# Patient Record
Sex: Female | Born: 1980 | Race: Black or African American | Hispanic: No | Smoking: Never smoker
Health system: Southern US, Community
[De-identification: ages and names within clinical notes are randomized; demographics above are authoritative.]

## PROBLEM LIST (undated history)

## (undated) ENCOUNTER — Inpatient Hospital Stay (HOSPITAL_COMMUNITY): Payer: Self-pay

## (undated) DIAGNOSIS — R87629 Unspecified abnormal cytological findings in specimens from vagina: Secondary | ICD-10-CM

## (undated) DIAGNOSIS — M7710 Lateral epicondylitis, unspecified elbow: Secondary | ICD-10-CM

## (undated) DIAGNOSIS — D649 Anemia, unspecified: Secondary | ICD-10-CM

## (undated) DIAGNOSIS — R51 Headache: Secondary | ICD-10-CM

## (undated) DIAGNOSIS — S0300XA Dislocation of jaw, unspecified side, initial encounter: Secondary | ICD-10-CM

## (undated) DIAGNOSIS — R519 Headache, unspecified: Secondary | ICD-10-CM

## (undated) DIAGNOSIS — D5720 Sickle-cell/Hb-C disease without crisis: Secondary | ICD-10-CM

## (undated) HISTORY — PX: NO PAST SURGERIES: SHX2092

## (undated) HISTORY — DX: Dislocation of jaw, unspecified side, initial encounter: S03.00XA

## (undated) HISTORY — DX: Unspecified abnormal cytological findings in specimens from vagina: R87.629

## (undated) HISTORY — DX: Sickle-cell/Hb-C disease without crisis: D57.20

## (undated) HISTORY — DX: Lateral epicondylitis, unspecified elbow: M77.10

---

## 2006-08-23 ENCOUNTER — Inpatient Hospital Stay (HOSPITAL_COMMUNITY): Admission: AD | Admit: 2006-08-23 | Discharge: 2006-08-23 | Payer: Self-pay | Admitting: Family Medicine

## 2007-09-25 ENCOUNTER — Emergency Department (HOSPITAL_COMMUNITY): Admission: EM | Admit: 2007-09-25 | Discharge: 2007-09-25 | Payer: Self-pay | Admitting: Family Medicine

## 2007-10-15 ENCOUNTER — Emergency Department (HOSPITAL_COMMUNITY): Admission: EM | Admit: 2007-10-15 | Discharge: 2007-10-15 | Payer: Self-pay | Admitting: Emergency Medicine

## 2007-11-21 ENCOUNTER — Emergency Department (HOSPITAL_COMMUNITY): Admission: EM | Admit: 2007-11-21 | Discharge: 2007-11-21 | Payer: Self-pay | Admitting: Emergency Medicine

## 2008-01-09 ENCOUNTER — Emergency Department (HOSPITAL_COMMUNITY): Admission: EM | Admit: 2008-01-09 | Discharge: 2008-01-09 | Payer: Self-pay | Admitting: Emergency Medicine

## 2009-03-16 ENCOUNTER — Inpatient Hospital Stay (HOSPITAL_COMMUNITY): Admission: AD | Admit: 2009-03-16 | Discharge: 2009-03-16 | Payer: Self-pay | Admitting: Obstetrics & Gynecology

## 2009-05-02 ENCOUNTER — Ambulatory Visit: Payer: Self-pay | Admitting: Advanced Practice Midwife

## 2009-05-02 ENCOUNTER — Inpatient Hospital Stay (HOSPITAL_COMMUNITY): Admission: AD | Admit: 2009-05-02 | Discharge: 2009-05-02 | Payer: Self-pay | Admitting: Obstetrics & Gynecology

## 2009-09-23 ENCOUNTER — Encounter: Payer: Self-pay | Admitting: Obstetrics and Gynecology

## 2009-09-23 ENCOUNTER — Ambulatory Visit: Payer: Self-pay | Admitting: Obstetrics and Gynecology

## 2009-09-23 LAB — CONVERTED CEMR LAB
Chlamydia, DNA Probe: NEGATIVE
GC Probe Amp, Genital: NEGATIVE
HCT: 26.6 % — ABNORMAL LOW (ref 36.0–46.0)
Hemoglobin: 9.1 g/dL — ABNORMAL LOW (ref 12.0–15.0)
MCHC: 34.2 g/dL (ref 30.0–36.0)
MCV: 80.4 fL (ref 78.0–100.0)
WBC: 6.5 10*3/uL (ref 4.0–10.5)

## 2009-09-30 ENCOUNTER — Ambulatory Visit: Payer: Self-pay | Admitting: Obstetrics and Gynecology

## 2009-09-30 LAB — CONVERTED CEMR LAB
Eosinophils Absolute: 0.1 10*3/uL (ref 0.0–0.7)
Hepatitis B Surface Ag: NEGATIVE
Lymphocytes Relative: 18 % (ref 12–46)
Lymphs Abs: 1.3 10*3/uL (ref 0.7–4.0)
MCHC: 34.8 g/dL (ref 30.0–36.0)
MCV: 81.3 fL (ref 78.0–100.0)
RDW: 19.1 % — ABNORMAL HIGH (ref 11.5–15.5)
Rh Type: POSITIVE
Rubella: 45.3 intl units/mL — ABNORMAL HIGH

## 2009-10-07 ENCOUNTER — Ambulatory Visit: Payer: Self-pay | Admitting: Obstetrics and Gynecology

## 2009-10-08 ENCOUNTER — Encounter: Payer: Self-pay | Admitting: Obstetrics and Gynecology

## 2009-10-14 ENCOUNTER — Encounter (INDEPENDENT_AMBULATORY_CARE_PROVIDER_SITE_OTHER): Payer: Self-pay | Admitting: *Deleted

## 2009-10-14 ENCOUNTER — Encounter: Payer: Self-pay | Admitting: Advanced Practice Midwife

## 2009-10-14 ENCOUNTER — Ambulatory Visit: Payer: Self-pay | Admitting: Obstetrics & Gynecology

## 2009-10-14 LAB — CONVERTED CEMR LAB
Clue Cells Wet Prep HPF POC: NONE SEEN
Trich, Wet Prep: NONE SEEN

## 2009-10-21 ENCOUNTER — Ambulatory Visit: Payer: Self-pay | Admitting: Obstetrics and Gynecology

## 2009-10-28 ENCOUNTER — Ambulatory Visit: Payer: Self-pay | Admitting: Obstetrics and Gynecology

## 2009-10-30 ENCOUNTER — Inpatient Hospital Stay (HOSPITAL_COMMUNITY): Admission: AD | Admit: 2009-10-30 | Discharge: 2009-10-30 | Payer: Self-pay | Admitting: Obstetrics and Gynecology

## 2009-11-02 ENCOUNTER — Ambulatory Visit: Payer: Self-pay | Admitting: Obstetrics & Gynecology

## 2009-11-02 ENCOUNTER — Inpatient Hospital Stay (HOSPITAL_COMMUNITY): Admission: AD | Admit: 2009-11-02 | Discharge: 2009-11-04 | Payer: Self-pay | Admitting: Obstetrics & Gynecology

## 2009-11-12 ENCOUNTER — Inpatient Hospital Stay (HOSPITAL_COMMUNITY): Admission: AD | Admit: 2009-11-12 | Discharge: 2009-11-12 | Payer: Self-pay | Admitting: Obstetrics & Gynecology

## 2010-03-09 ENCOUNTER — Inpatient Hospital Stay (HOSPITAL_COMMUNITY): Payer: Self-pay

## 2010-03-09 ENCOUNTER — Inpatient Hospital Stay (HOSPITAL_COMMUNITY)
Admission: AD | Admit: 2010-03-09 | Discharge: 2010-03-09 | Disposition: A | Discharge status: Home / Self Care | Payer: Self-pay | Source: Ambulatory Visit | Attending: Obstetrics & Gynecology | Admitting: Obstetrics & Gynecology

## 2010-03-09 DIAGNOSIS — J40 Bronchitis, not specified as acute or chronic: Secondary | ICD-10-CM

## 2010-03-16 LAB — URINALYSIS, ROUTINE W REFLEX MICROSCOPIC
Bilirubin Urine: NEGATIVE
Glucose, UA: NEGATIVE mg/dL
Specific Gravity, Urine: 1.015 (ref 1.005–1.030)
Urobilinogen, UA: 1 mg/dL (ref 0.0–1.0)

## 2010-03-16 LAB — URINE CULTURE: Culture  Setup Time: 201111102121

## 2010-03-16 LAB — URINE MICROSCOPIC-ADD ON

## 2010-03-17 LAB — CBC
HCT: 30.3 % — ABNORMAL LOW (ref 36.0–46.0)
Hemoglobin: 10.1 g/dL — ABNORMAL LOW (ref 12.0–15.0)
MCHC: 33.3 g/dL (ref 30.0–36.0)
MCV: 89.4 fL (ref 78.0–100.0)
Platelets: 269 10*3/uL (ref 150–400)

## 2010-03-17 LAB — POCT URINALYSIS DIPSTICK
Glucose, UA: NEGATIVE mg/dL
Nitrite: NEGATIVE
Nitrite: NEGATIVE
Specific Gravity, Urine: 1.02 (ref 1.005–1.030)
pH: 6.5 (ref 5.0–8.0)

## 2010-03-17 LAB — RPR: RPR Ser Ql: NONREACTIVE

## 2010-03-18 LAB — POCT URINALYSIS DIPSTICK
Bilirubin Urine: NEGATIVE
Glucose, UA: NEGATIVE mg/dL
Glucose, UA: NEGATIVE mg/dL
Ketones, ur: NEGATIVE mg/dL
Nitrite: NEGATIVE
Nitrite: NEGATIVE
Nitrite: NEGATIVE
Protein, ur: NEGATIVE mg/dL
Protein, ur: NEGATIVE mg/dL
Protein, ur: NEGATIVE mg/dL
Specific Gravity, Urine: 1.015 (ref 1.005–1.030)
Specific Gravity, Urine: 1.015 (ref 1.005–1.030)
Urobilinogen, UA: 0.2 mg/dL (ref 0.0–1.0)
pH: 5.5 (ref 5.0–8.0)
pH: 6 (ref 5.0–8.0)

## 2010-03-23 LAB — CBC
HCT: 26.5 % — ABNORMAL LOW (ref 36.0–46.0)
Hemoglobin: 9.1 g/dL — ABNORMAL LOW (ref 12.0–15.0)
MCHC: 34.4 g/dL (ref 30.0–36.0)
MCV: 81.8 fL (ref 78.0–100.0)
Platelets: 230 10*3/uL (ref 150–400)
RDW: 20.5 % — ABNORMAL HIGH (ref 11.5–15.5)
WBC: 7.8 10*3/uL (ref 4.0–10.5)

## 2010-03-23 LAB — URINE MICROSCOPIC-ADD ON

## 2010-03-23 LAB — URINALYSIS, ROUTINE W REFLEX MICROSCOPIC
Hgb urine dipstick: NEGATIVE
Ketones, ur: 40 mg/dL — AB
Protein, ur: NEGATIVE mg/dL
Specific Gravity, Urine: 1.025 (ref 1.005–1.030)
Urobilinogen, UA: 0.2 mg/dL (ref 0.0–1.0)

## 2010-03-23 LAB — COMPREHENSIVE METABOLIC PANEL
Albumin: 3.9 g/dL (ref 3.5–5.2)
Alkaline Phosphatase: 41 U/L (ref 39–117)
CO2: 25 mEq/L (ref 19–32)
Calcium: 9.4 mg/dL (ref 8.4–10.5)
GFR calc non Af Amer: >60 mL/min (ref >60)
Glucose, Bld: 82 mg/dL (ref 70–99)
Potassium: 3.7 mEq/L (ref 3.5–5.1)
Total Bilirubin: 1.2 mg/dL (ref 0.3–1.2)

## 2010-03-28 LAB — GC/CHLAMYDIA PROBE AMP, GENITAL
Chlamydia, DNA Probe: NEGATIVE
GC Probe Amp, Genital: NEGATIVE

## 2010-03-28 LAB — URINE MICROSCOPIC-ADD ON

## 2010-03-28 LAB — WET PREP, GENITAL
Trich, Wet Prep: NONE SEEN
Yeast Wet Prep HPF POC: NONE SEEN

## 2010-03-28 LAB — URINALYSIS, ROUTINE W REFLEX MICROSCOPIC
Glucose, UA: NEGATIVE mg/dL
Hgb urine dipstick: NEGATIVE
Nitrite: NEGATIVE

## 2010-10-15 LAB — CBC
Hemoglobin: 10 — ABNORMAL LOW
RBC: 3.59 — ABNORMAL LOW
WBC: 6.1

## 2010-10-15 LAB — URINALYSIS, ROUTINE W REFLEX MICROSCOPIC
Protein, ur: NEGATIVE
Specific Gravity, Urine: <1.005 — ABNORMAL LOW
Urobilinogen, UA: 1

## 2010-10-15 LAB — WET PREP, GENITAL
Clue Cells Wet Prep HPF POC: NONE SEEN
Trich, Wet Prep: NONE SEEN

## 2010-10-15 LAB — URINE MICROSCOPIC-ADD ON

## 2010-10-15 LAB — HCG, QUANTITATIVE, PREGNANCY: hCG, Beta Chain, Quant, S: 61442 — ABNORMAL HIGH

## 2010-10-15 LAB — POCT PREGNANCY, URINE: Operator id: 239321

## 2010-10-15 LAB — GC/CHLAMYDIA PROBE AMP, GENITAL
Chlamydia, DNA Probe: NEGATIVE
GC Probe Amp, Genital: NEGATIVE

## 2011-01-04 NOTE — L&D Delivery Note (Signed)
Delivery Note At 4:58 AM a viable female was delivered via Vaginal, Spontaneous Delivery (Presentation ROA;  ).  APGAR:8/9 , ; weight pending.   Placenta status: intact with 3V  Cord:  with the following complications: . Tight nuchal cord X1, delivered through  Anesthesia:  epidural Episiotomy: none Lacerations:  Suture Repair: n/a Est. Blood Loss (mL):   Mom to postpartum.  Baby to nursery-stable. Delivered by Dr. Chelsea Aus under my supervision  Taylor Roman,Taylor Roman 12/08/2011, 5:18 AM

## 2011-05-13 ENCOUNTER — Encounter (HOSPITAL_COMMUNITY): Payer: Self-pay | Admitting: *Deleted

## 2011-05-13 ENCOUNTER — Inpatient Hospital Stay (HOSPITAL_COMMUNITY)
Admission: AD | Admit: 2011-05-13 | Discharge: 2011-05-13 | Disposition: A | Discharge status: Home / Self Care | Payer: Self-pay | Source: Ambulatory Visit | Attending: Obstetrics and Gynecology | Admitting: Obstetrics and Gynecology

## 2011-05-13 DIAGNOSIS — O26899 Other specified pregnancy related conditions, unspecified trimester: Secondary | ICD-10-CM

## 2011-05-13 DIAGNOSIS — R109 Unspecified abdominal pain: Secondary | ICD-10-CM | POA: Insufficient documentation

## 2011-05-13 DIAGNOSIS — R12 Heartburn: Secondary | ICD-10-CM

## 2011-05-13 DIAGNOSIS — O99891 Other specified diseases and conditions complicating pregnancy: Secondary | ICD-10-CM | POA: Insufficient documentation

## 2011-05-13 DIAGNOSIS — R42 Dizziness and giddiness: Secondary | ICD-10-CM | POA: Insufficient documentation

## 2011-05-13 DIAGNOSIS — O99019 Anemia complicating pregnancy, unspecified trimester: Secondary | ICD-10-CM

## 2011-05-13 HISTORY — DX: Anemia, unspecified: D64.9

## 2011-05-13 LAB — CBC
MCV: 72.8 fL — ABNORMAL LOW (ref 78.0–100.0)
Platelets: 231 10*3/uL (ref 150–400)
RBC: 3.82 MIL/uL — ABNORMAL LOW (ref 3.87–5.11)
RDW: 18.5 % — ABNORMAL HIGH (ref 11.5–15.5)
WBC: 6.8 10*3/uL (ref 4.0–10.5)

## 2011-05-13 LAB — COMPREHENSIVE METABOLIC PANEL
ALT: 10 U/L (ref 0–35)
AST: 14 U/L (ref 0–37)
Albumin: 4.2 g/dL (ref 3.5–5.2)
CO2: 23 mEq/L (ref 19–32)
Chloride: 98 mEq/L (ref 96–112)
Creatinine, Ser: 0.54 mg/dL (ref 0.50–1.10)
Potassium: 3.8 mEq/L (ref 3.5–5.1)
Sodium: 132 mEq/L — ABNORMAL LOW (ref 135–145)
Total Bilirubin: 0.7 mg/dL (ref 0.3–1.2)

## 2011-05-13 LAB — URINE MICROSCOPIC-ADD ON

## 2011-05-13 LAB — POCT PREGNANCY, URINE
Preg Test, Ur: POSITIVE — AB
Preg Test, Ur: POSITIVE — AB

## 2011-05-13 LAB — URINALYSIS, ROUTINE W REFLEX MICROSCOPIC
Bilirubin Urine: NEGATIVE
Glucose, UA: NEGATIVE mg/dL
Hgb urine dipstick: NEGATIVE
Protein, ur: NEGATIVE mg/dL

## 2011-05-13 MED ORDER — FERROUS SULFATE 325 (65 FE) MG PO TABS: 325.0000 mg | ORAL_TABLET | Freq: Three times a day (TID) | ORAL | Status: DC

## 2011-05-13 MED ORDER — RANITIDINE HCL 150 MG PO TABS: 150.0000 mg | ORAL_TABLET | Freq: Two times a day (BID) | ORAL | Status: DC

## 2011-05-13 NOTE — MAU Note (Signed)
Pt states she was using depo proveria since last delivery 17 months ago.  Pt states she has very irregular periods since being on depo.  Depo was due again in April, pt did not receive this injection and wanted to become pregnant.

## 2011-05-13 NOTE — MAU Note (Signed)
feeling weak, off and on .  Did test at home last month. No care yet.  no palpable fundus when pt sitting in chair

## 2011-05-13 NOTE — MAU Provider Note (Signed)
History   CSN: [id number]  Arrival date and time: 05/13/11 1033   First Provider Initiated Contact with Patient 05/13/11 1132      Chief Complaint  Patient presents with  . Dizziness  . Abdominal Pain   HPI Taylor Roman is a 31 y.o. G2P1001 at approximately [redacted]w[redacted]d based on bedside ultrasound CRL measurement.  She presents to MAU today complaining of abdominal pain, nausea, dizziness, and weakness.  The symptoms started about a month ago, and she describes the abdominal pain as "crampy".  She states that she had a + home pregnancy test in early April.  She is not currently receiving prenatal care.  Patient reports LMP as 11/21/10 but states she has had irregular menses since starting depo injections after her first delivery 17 months.  She is unsure of when her last injection was but she has not received any in 2013.     Pertinent Gynecological History: Menses: Irregular since starting depo shot after first pregnancy. Estimated LMP per patient report 11/21/10.  Bleeding: no active bleeding at this time Contraception: Depo-Provera injections and last reported injection was sometime in 2012.  Sexually transmitted diseases: no past history Previous GYN Procedures: unknown    Past Medical History  Diagnosis Date  . Anemia   . Sickle cell trait     Past Surgical History  Procedure Date  . No past surgeries     Family History  Problem Relation Age of Onset  . Anesthesia problems Neg Hx     History  Substance Use Topics  . Smoking status: Never Smoker   . Smokeless tobacco: Never Used  . Alcohol Use: No    Allergies: No Known Allergies  Prescriptions prior to admission  Medication Sig Dispense Refill  . Prenatal Vit-Fe Fumarate-FA (PRENATAL MULTIVITAMIN) TABS Take 1 tablet by mouth daily.        Review of Systems  Gastrointestinal: Positive for nausea and abdominal pain (epigastric). Negative for vomiting.  Genitourinary: Negative for dysuria, urgency and frequency.    Neurological: Positive for weakness and headaches.   Physical Exam   Blood pressure 115/67, pulse 75, temperature 97.7 F (36.5 C), temperature source Oral, resp. rate 20, height 5\' 8"  (1.727 m), weight 97.07 kg (214 lb), last menstrual period 11/21/2010.  Physical Exam  Constitutional: Vital signs are normal. She appears well-developed and well-nourished. No distress.  Cardiovascular: Normal rate, regular rhythm and normal heart sounds.   Respiratory: Effort normal. She has wheezes.  GI: Soft. Normal appearance. There is tenderness in the epigastric area. There is no tenderness at McBurney's point and negative Murphy's sign.  Psychiatric: She has a normal mood and affect. Her speech is normal and behavior is normal.    MAU Course  Procedures    Results for orders placed during the hospital encounter of 05/13/11 (from the past 24 hour(s))  URINALYSIS, ROUTINE W REFLEX MICROSCOPIC     Status: Abnormal   Collection Time   05/13/11 10:45 AM      Component Value Range   Color, Urine YELLOW  YELLOW    APPearance HAZY (*) CLEAR    Specific Gravity, Urine 1.010  1.005 - 1.030    pH 7.0  5.0 - 8.0    Glucose, UA NEGATIVE  NEGATIVE (mg/dL)   Hgb urine dipstick NEGATIVE  NEGATIVE    Bilirubin Urine NEGATIVE  NEGATIVE    Ketones, ur NEGATIVE  NEGATIVE (mg/dL)   Protein, ur NEGATIVE  NEGATIVE (mg/dL)   Urobilinogen, UA 0.2  0.0 -  1.0 (mg/dL)   Nitrite NEGATIVE  NEGATIVE    Leukocytes, UA TRACE (*) NEGATIVE   URINE MICROSCOPIC-ADD ON     Status: Abnormal   Collection Time   05/13/11 10:45 AM      Component Value Range   Squamous Epithelial / LPF MANY (*) RARE    WBC, UA 3-6  <3 (WBC/hpf)  POCT PREGNANCY, URINE     Status: Abnormal   Collection Time   05/13/11 10:52 AM      Component Value Range   Preg Test, Ur POSITIVE (*) NEGATIVE   POCT PREGNANCY, URINE     Status: Abnormal   Collection Time   05/13/11 11:06 AM      Component Value Range   Preg Test, Ur POSITIVE (*) NEGATIVE    CBC     Status: Abnormal   Collection Time   05/13/11 11:08 AM      Component Value Range   WBC 6.8  4.0 - 10.5 (K/uL)   RBC 3.82 (*) 3.87 - 5.11 (MIL/uL)   Hemoglobin 9.8 (*) 12.0 - 15.0 (g/dL)   HCT 78.2 (*) 95.6 - 46.0 (%)   MCV 72.8 (*) 78.0 - 100.0 (fL)   MCH 25.7 (*) 26.0 - 34.0 (pg)   MCHC 35.3  30.0 - 36.0 (g/dL)   RDW 21.3 (*) 08.6 - 15.5 (%)   Platelets 231  150 - 400 (K/uL)  COMPREHENSIVE METABOLIC PANEL     Status: Abnormal   Collection Time   05/13/11 11:08 AM      Component Value Range   Sodium 132 (*) 135 - 145 (mEq/L)   Potassium 3.8  3.5 - 5.1 (mEq/L)   Chloride 98  96 - 112 (mEq/L)   CO2 23  19 - 32 (mEq/L)   Glucose, Bld 89  70 - 99 (mg/dL)   BUN 5 (*) 6 - 23 (mg/dL)   Creatinine, Ser 5.78  0.50 - 1.10 (mg/dL)   Calcium 9.6  8.4 - 46.9 (mg/dL)   Total Protein 7.9  6.0 - 8.3 (g/dL)   Albumin 4.2  3.5 - 5.2 (g/dL)   AST 14  0 - 37 (U/L)   ALT 10  0 - 35 (U/L)   Alkaline Phosphatase 64  39 - 117 (U/L)   Total Bilirubin 0.7  0.3 - 1.2 (mg/dL)   GFR calc non Af Amer >90  >90 (mL/min)   GFR calc Af Amer >90  >90 (mL/min)     Assessment and Plan  Bedside u/s reveals [redacted]w[redacted]d IUP based on CRL measurements with + FHR. Given ferrous sulfate for anemia and Zantac for heartburn.  [facility]. Verification letter given for Medicaid application.      Bedelia Person, PA-S 05/13/2011, 12:00 PM   Pt seen with PA student, agree with above note

## 2011-05-16 NOTE — MAU Provider Note (Signed)
Agree with above note.  Taylor Roman 05/16/2011 11:56 AM

## 2011-07-03 ENCOUNTER — Inpatient Hospital Stay (HOSPITAL_COMMUNITY)
Admission: AD | Admit: 2011-07-03 | Discharge: 2011-07-03 | Disposition: A | Discharge status: Home / Self Care | Payer: Self-pay | Source: Ambulatory Visit | Attending: Obstetrics and Gynecology | Admitting: Obstetrics and Gynecology

## 2011-07-03 ENCOUNTER — Encounter (HOSPITAL_COMMUNITY): Payer: Self-pay | Admitting: Obstetrics and Gynecology

## 2011-07-03 DIAGNOSIS — O2390 Unspecified genitourinary tract infection in pregnancy, unspecified trimester: Secondary | ICD-10-CM | POA: Insufficient documentation

## 2011-07-03 DIAGNOSIS — R109 Unspecified abdominal pain: Secondary | ICD-10-CM | POA: Insufficient documentation

## 2011-07-03 DIAGNOSIS — O2340 Unspecified infection of urinary tract in pregnancy, unspecified trimester: Secondary | ICD-10-CM

## 2011-07-03 DIAGNOSIS — N390 Urinary tract infection, site not specified: Secondary | ICD-10-CM | POA: Insufficient documentation

## 2011-07-03 LAB — WET PREP, GENITAL
Clue Cells Wet Prep HPF POC: NONE SEEN
Trich, Wet Prep: NONE SEEN
Yeast Wet Prep HPF POC: NONE SEEN

## 2011-07-03 LAB — URINALYSIS, ROUTINE W REFLEX MICROSCOPIC
Bilirubin Urine: NEGATIVE
Glucose, UA: NEGATIVE mg/dL
Hgb urine dipstick: NEGATIVE
Ketones, ur: NEGATIVE mg/dL
Nitrite: NEGATIVE
Protein, ur: NEGATIVE mg/dL
Specific Gravity, Urine: <1.005 — ABNORMAL LOW (ref 1.005–1.030)
Urobilinogen, UA: 0.2 mg/dL (ref 0.0–1.0)
pH: 6 (ref 5.0–8.0)

## 2011-07-03 LAB — URINE MICROSCOPIC-ADD ON

## 2011-07-03 MED ORDER — NITROFURANTOIN MONOHYD MACRO 100 MG PO CAPS: 100.0000 mg | ORAL_CAPSULE | Freq: Two times a day (BID) | ORAL | Status: AC

## 2011-07-03 NOTE — MAU Note (Signed)
Pt presents to MAU with chief complaint of abdominal cramping. Pt is 28w3days; has not started prenatal care, [facility]. Pt says the cramping started last night and has been occuring all day. Denies vaginal bleeding, spotting or discharge. Pt is a Therapist, art

## 2011-07-03 NOTE — MAU Provider Note (Signed)
  History     CSN: [id number]  Arrival date and time: 07/03/11 1445   First Provider Initiated Contact with Patient 07/03/11 1642      Chief Complaint  Patient presents with  . Abdominal Cramping   HPI Taylor Roman is a 31 y.o. G3P2001 at [redacted]w[redacted]d. She c/o cramping off/on recently. No change in discharge,odor, itching, no UTI S&S or GI changes. No bleeding or spotting. She has not felt fetal movement x 2 days, had been feeling it for a long time.  No prenatal care yet, has an appt with clinic here from [facility].    Past Medical History  Diagnosis Date  . Anemia   . Sickle cell trait     Past Surgical History  Procedure Date  . No past surgeries     Family History  Problem Relation Age of Onset  . Anesthesia problems Neg Hx     History  Substance Use Topics  . Smoking status: Never Smoker   . Smokeless tobacco: Never Used  . Alcohol Use: No    Allergies: No Known Allergies  Prescriptions prior to admission  Medication Sig Dispense Refill  . ferrous sulfate 325 (65 FE) MG tablet Take 1 tablet (325 mg total) by mouth 3 (three) times daily with meals.  90 tablet  11  . Prenatal Vit-Fe Fumarate-FA (PRENATAL MULTIVITAMIN) TABS Take 1 tablet by mouth daily.        Review of Systems  Constitutional: Negative for fever and chills.  Gastrointestinal: Negative for heartburn, nausea, vomiting, diarrhea and constipation.   Physical Exam   Blood pressure 121/68, pulse 84, temperature 99.2 F (37.3 C), temperature source Oral, resp. rate 18, last menstrual period 11/21/2010.  Physical Exam  Constitutional: She is oriented to person, place, and time. She appears well-developed and well-nourished.  GI: Soft. There is no tenderness. There is no rebound.       Gravid uterus, 16-18 wk size. FHR 150.  Genitourinary: There is no rash, tenderness or lesion on the right labia. There is no rash, tenderness or lesion on the left labia. Uterus is enlarged. Uterus is not tender.  Cervix exhibits no motion tenderness, no discharge and no friability. No bleeding around the vagina. Vaginal discharge found.  Musculoskeletal: Normal range of motion.  Neurological: She is alert and oriented to person, place, and time.  Skin: Skin is warm and dry.  Psychiatric: She has a normal mood and affect. Her behavior is normal.    MAU Course  Procedures  MDM 17 6/7 wks, viable SST C&S on urine, will tx with Macrobid To keep her appt for prenatal care 7/17 in clinic.  Assessment and Plan    Effie Janoski M. 07/03/2011, 4:49 PM

## 2011-07-05 LAB — URINE CULTURE
Colony Count: NO GROWTH
Culture: NO GROWTH

## 2011-07-06 ENCOUNTER — Encounter: Payer: Self-pay | Admitting: Physician Assistant

## 2011-07-06 ENCOUNTER — Ambulatory Visit (INDEPENDENT_AMBULATORY_CARE_PROVIDER_SITE_OTHER): Payer: Self-pay | Admitting: Physician Assistant

## 2011-07-06 VITALS — BP 104/55 | Temp 97.8°F | Wt 213.8 lb

## 2011-07-06 DIAGNOSIS — O0990 Supervision of high risk pregnancy, unspecified, unspecified trimester: Secondary | ICD-10-CM | POA: Insufficient documentation

## 2011-07-06 DIAGNOSIS — Z3490 Encounter for supervision of normal pregnancy, unspecified, unspecified trimester: Secondary | ICD-10-CM

## 2011-07-06 DIAGNOSIS — O0930 Supervision of pregnancy with insufficient antenatal care, unspecified trimester: Secondary | ICD-10-CM

## 2011-07-06 NOTE — Progress Notes (Signed)
Pulse: 74. C/o dull pain on lower abdomen and lower back. No pressure.

## 2011-07-06 NOTE — Patient Instructions (Signed)
Pregnancy - Second Trimester The second trimester of pregnancy (3 to 6 months) is a period of rapid growth for you and your baby. At the end of the sixth month, your baby is about 9 inches long and weighs 1 1/2 pounds. You will begin to feel the baby move between 18 and 20 weeks of the pregnancy. This is called quickening. Weight gain is faster. A clear fluid (colostrum) may leak out of your breasts. You may feel small contractions of the womb (uterus). This is known as false labor or Braxton-Hicks contractions. This is like a practice for labor when the baby is ready to be born. Usually, the problems with morning sickness have usually passed by the end of your first trimester. Some women develop small dark blotches (called cholasma, mask of pregnancy) on their face that usually goes away after the baby is born. Exposure to the sun makes the blotches worse. Acne may also develop in some pregnant women and pregnant women who have acne, may find that it goes away. PRENATAL EXAMS  Blood work may continue to be done during prenatal exams. These tests are done to check on your health and the probable health of your baby. Blood work is used to follow your blood levels (hemoglobin). Anemia (low hemoglobin) is common during pregnancy. Iron and vitamins are given to help prevent this. You will also be checked for diabetes between 24 and 28 weeks of the pregnancy. Some of the previous blood tests may be repeated.   The size of the uterus is measured during each visit. This is to make sure that the baby is continuing to grow properly according to the dates of the pregnancy.   Your blood pressure is checked every prenatal visit. This is to make sure you are not getting toxemia.   Your urine is checked to make sure you do not have an infection, diabetes or protein in the urine.   Your weight is checked often to make sure gains are happening at the suggested rate. This is to ensure that both you and your baby are  growing normally.   Sometimes, an ultrasound is performed to confirm the proper growth and development of the baby. This is a test which bounces harmless sound waves off the baby so your caregiver can more accurately determine due dates.  Sometimes, a specialized test is done on the amniotic fluid surrounding the baby. This test is called an amniocentesis. The amniotic fluid is obtained by sticking a needle into the belly (abdomen). This is done to check the chromosomes in instances where there is a concern about possible genetic problems with the baby. It is also sometimes done near the end of pregnancy if an early delivery is required. In this case, it is done to help make sure the baby's lungs are mature enough for the baby to live outside of the womb. CHANGES OCCURING IN THE SECOND TRIMESTER OF PREGNANCY Your body goes through many changes during pregnancy. They vary from person to person. Talk to your caregiver about changes you notice that you are concerned about.  During the second trimester, you will likely have an increase in your appetite. It is normal to have cravings for certain foods. This varies from person to person and pregnancy to pregnancy.   Your lower abdomen will begin to bulge.   You may have to urinate more often because the uterus and baby are pressing on your bladder. It is also common to get more bladder infections during pregnancy (  pain with urination). You can help this by drinking lots of fluids and emptying your bladder before and after intercourse.   You may begin to get stretch marks on your hips, abdomen, and breasts. These are normal changes in the body during pregnancy. There are no exercises or medications to take that prevent this change.   You may begin to develop swollen and bulging veins (varicose veins) in your legs. Wearing support hose, elevating your feet for 15 minutes, 3 to 4 times a day and limiting salt in your diet helps lessen the problem.    Heartburn may develop as the uterus grows and pushes up against the stomach. Antacids recommended by your caregiver helps with this problem. Also, eating smaller meals 4 to 5 times a day helps.   Constipation can be treated with a stool softener or adding bulk to your diet. Drinking lots of fluids, vegetables, fruits, and whole grains are helpful.   Exercising is also helpful. If you have been very active up until your pregnancy, most of these activities can be continued during your pregnancy. If you have been less active, it is helpful to start an exercise program such as walking.   Hemorrhoids (varicose veins in the rectum) may develop at the end of the second trimester. Warm sitz baths and hemorrhoid cream recommended by your caregiver helps hemorrhoid problems.   Backaches may develop during this time of your pregnancy. Avoid heavy lifting, wear low heal shoes and practice good posture to help with backache problems.   Some pregnant women develop tingling and numbness of their hand and fingers because of swelling and tightening of ligaments in the wrist (carpel tunnel syndrome). This goes away after the baby is born.   As your breasts enlarge, you may have to get a bigger bra. Get a comfortable, cotton, support bra. Do not get a nursing bra until the last month of the pregnancy if you will be nursing the baby.   You may get a dark line from your belly button to the pubic area called the linea nigra.   You may develop rosy cheeks because of increase blood flow to the face.   You may develop spider looking lines of the face, neck, arms and chest. These go away after the baby is born.  HOME CARE INSTRUCTIONS   It is extremely important to avoid all smoking, herbs, alcohol, and unprescribed drugs during your pregnancy. These chemicals affect the formation and growth of the baby. Avoid these chemicals throughout the pregnancy to ensure the delivery of a healthy infant.   Most of your home  care instructions are the same as suggested for the first trimester of your pregnancy. Keep your caregiver's appointments. Follow your caregiver's instructions regarding medication use, exercise and diet.   During pregnancy, you are providing food for you and your baby. Continue to eat regular, well-balanced meals. Choose foods such as meat, fish, milk and other low fat dairy products, vegetables, fruits, and whole-grain breads and cereals. Your caregiver will tell you of the ideal weight gain.   A physical sexual relationship may be continued up until near the end of pregnancy if there are no other problems. Problems could include early (premature) leaking of amniotic fluid from the membranes, vaginal bleeding, abdominal pain, or other medical or pregnancy problems.   Exercise regularly if there are no restrictions. Check with your caregiver if you are unsure of the safety of some of your exercises. The greatest weight gain will occur in the   last 2 trimesters of pregnancy. Exercise will help you:   Control your weight.   Get you in shape for labor and delivery.   Lose weight after you have the baby.   Wear a good support or jogging bra for breast tenderness during pregnancy. This may help if worn during sleep. Pads or tissues may be used in the bra if you are leaking colostrum.   Do not use hot tubs, steam rooms or saunas throughout the pregnancy.   Wear your seat belt at all times when driving. This protects you and your baby if you are in an accident.   Avoid raw meat, uncooked cheese, cat litter boxes and soil used by cats. These carry germs that can cause birth defects in the baby.   The second trimester is also a good time to visit your dentist for your dental health if this has not been done yet. Getting your teeth cleaned is OK. Use a soft toothbrush. Brush gently during pregnancy.   It is easier to loose urine during pregnancy. Tightening up and strengthening the pelvic muscles will  help with this problem. Practice stopping your urination while you are going to the bathroom. These are the same muscles you need to strengthen. It is also the muscles you would use as if you were trying to stop from passing gas. You can practice tightening these muscles up 10 times a set and repeating this about 3 times per day. Once you know what muscles to tighten up, do not perform these exercises during urination. It is more likely to contribute to an infection by backing up the urine.   Ask for help if you have financial, counseling or nutritional needs during pregnancy. Your caregiver will be able to offer counseling for these needs as well as refer you for other special needs.   Your skin may become oily. If so, wash your face with mild soap, use non-greasy moisturizer and oil or cream based makeup.  MEDICATIONS AND DRUG USE IN PREGNANCY  Take prenatal vitamins as directed. The vitamin should contain 1 milligram of folic acid. Keep all vitamins out of reach of children. Only a couple vitamins or tablets containing iron may be fatal to a baby or young child when ingested.   Avoid use of all medications, including herbs, over-the-counter medications, not prescribed or suggested by your caregiver. Only take over-the-counter or prescription medicines for pain, discomfort, or fever as directed by your caregiver. Do not use aspirin.   Let your caregiver also know about herbs you may be using.   Alcohol is related to a number of birth defects. This includes fetal alcohol syndrome. All alcohol, in any form, should be avoided completely. Smoking will cause low birth rate and premature babies.   Street or illegal drugs are very harmful to the baby. They are absolutely forbidden. A baby born to an addicted mother will be addicted at birth. The baby will go through the same withdrawal an adult does.  SEEK MEDICAL CARE IF:  You have any concerns or worries during your pregnancy. It is better to call with  your questions if you feel they cannot wait, rather than worry about them. SEEK IMMEDIATE MEDICAL CARE IF:   An unexplained oral temperature above 102 F (38.9 C) develops, or as your caregiver suggests.   You have leaking of fluid from the vagina (birth canal). If leaking membranes are suspected, take your temperature and tell your caregiver of this when you call.   There   is vaginal spotting, bleeding, or passing clots. Tell your caregiver of the amount and how many pads are used. Light spotting in pregnancy is common, especially following intercourse.   You develop a bad smelling vaginal discharge with a change in the color from clear to white.   You continue to feel sick to your stomach (nauseated) and have no relief from remedies suggested. You vomit blood or coffee ground-like materials.   You lose more than 2 pounds of weight or gain more than 2 pounds of weight over 1 week, or as suggested by your caregiver.   You notice swelling of your face, hands, feet, or legs.   You get exposed to German measles and have never had them.   You are exposed to fifth disease or chickenpox.   You develop belly (abdominal) pain. Round ligament discomfort is a common non-cancerous (benign) cause of abdominal pain in pregnancy. Your caregiver still must evaluate you.   You develop a bad headache that does not go away.   You develop fever, diarrhea, pain with urination, or shortness of breath.   You develop visual problems, blurry, or double vision.   You fall or are in a car accident or any kind of trauma.   There is mental or physical violence at home.  Document Released: 12/14/2000 Document Revised: 12/09/2010 Document Reviewed: 06/18/2008 [employer] Patient Information 2012 [employer]. 
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
## 2011-07-07 LAB — HIV ANTIBODY (ROUTINE TESTING W REFLEX): HIV: NONREACTIVE

## 2011-07-07 LAB — OBSTETRIC PANEL
Basophils Relative: 1 % (ref 0–1)
Eosinophils Absolute: 0.1 10*3/uL (ref 0.0–0.7)
Eosinophils Relative: 2 % (ref 0–5)
Hepatitis B Surface Ag: NEGATIVE
Lymphs Abs: 1.3 10*3/uL (ref 0.7–4.0)
MCH: 26.8 pg (ref 26.0–34.0)
MCHC: 33 g/dL (ref 30.0–36.0)
MCV: 81.4 fL (ref 78.0–100.0)
Neutrophils Relative %: 69 % (ref 43–77)
Platelets: 247 10*3/uL (ref 150–400)
RBC: 3.54 MIL/uL — ABNORMAL LOW (ref 3.87–5.11)
Rh Type: POSITIVE

## 2011-07-10 NOTE — MAU Provider Note (Signed)
Attestation of Attending Supervision of Advanced Practitioner: Evaluation and management procedures were performed by the PA/NP/CNM/OB Fellow under my supervision/collaboration. Chart reviewed and agree with management and plan.  Dorse Locy V 07/10/2011 11:00 AM    

## 2011-07-11 LAB — HEMOGLOBINOPATHY EVALUATION
Hgb A2 Quant: 2.9 % (ref 2.2–3.2)
Hgb A: 0 % — ABNORMAL LOW (ref 96.8–97.8)
Hgb S Quant: 51.2 % — ABNORMAL HIGH

## 2011-07-13 ENCOUNTER — Encounter: Payer: Self-pay | Admitting: Physician Assistant

## 2011-07-13 DIAGNOSIS — D5720 Sickle-cell/Hb-C disease without crisis: Secondary | ICD-10-CM | POA: Insufficient documentation

## 2011-07-14 LAB — HGB ELECTROPHORESIS REFLEXED REPORT
Hemoglobin A2 - HGBRFX: 3.9 % — ABNORMAL HIGH (ref 1.8–3.5)
Hemoglobin S - HGBRFX: 49.8 % — ABNORMAL HIGH
Sickle Solubility Test - HGBRFX: POSITIVE — AB

## 2011-07-20 ENCOUNTER — Encounter: Payer: Self-pay | Admitting: *Deleted

## 2011-08-02 ENCOUNTER — Telehealth: Payer: Self-pay | Admitting: *Deleted

## 2011-08-02 NOTE — Telephone Encounter (Signed)
Called pt and left message that she needs an [place] appt scheduled. Please call back and indicate when she would like the appt.  *Note: pt has order already placed for detail +14 wk scan- needs done preferably within 1 wk.

## 2011-08-03 ENCOUNTER — Ambulatory Visit (INDEPENDENT_AMBULATORY_CARE_PROVIDER_SITE_OTHER): Payer: Self-pay | Admitting: Family Medicine

## 2011-08-03 VITALS — BP 103/61 | Temp 99.2°F | Wt 215.1 lb

## 2011-08-03 DIAGNOSIS — O99019 Anemia complicating pregnancy, unspecified trimester: Secondary | ICD-10-CM

## 2011-08-03 DIAGNOSIS — Z8632 Personal history of gestational diabetes: Secondary | ICD-10-CM

## 2011-08-03 DIAGNOSIS — O09299 Supervision of pregnancy with other poor reproductive or obstetric history, unspecified trimester: Secondary | ICD-10-CM

## 2011-08-03 DIAGNOSIS — B977 Papillomavirus as the cause of diseases classified elsewhere: Secondary | ICD-10-CM

## 2011-08-03 DIAGNOSIS — D5720 Sickle-cell/Hb-C disease without crisis: Secondary | ICD-10-CM

## 2011-08-03 LAB — POCT URINALYSIS DIP (DEVICE)
Hgb urine dipstick: NEGATIVE
Nitrite: NEGATIVE
Protein, ur: NEGATIVE mg/dL
Urobilinogen, UA: 1 mg/dL (ref 0.0–1.0)
pH: 6.5 (ref 5.0–8.0)

## 2011-08-03 MED ORDER — NYSTATIN 100000 UNIT/GM EX POWD: Freq: Four times a day (QID) | CUTANEOUS | Status: DC

## 2011-08-03 NOTE — Telephone Encounter (Signed)
Called pt and informed her of need for [place].  Appt scheduled 08/08/11 @ 1030. Pt voiced understanding.

## 2011-08-03 NOTE — Progress Notes (Signed)
Pulse 71 Needs rx for PNV

## 2011-08-03 NOTE — Progress Notes (Signed)
Patient without complaints.  Denies vaginal bleeding, abnormal vaginal discharge, contractions, loss of fluid.  Reports good fetal activity.  Follow up in 4 weeks.  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  High risk for repeat GDM - will get early 1hr.  Transfer to high risk clinic

## 2011-08-03 NOTE — Progress Notes (Signed)
Ob detail [place] scheduled 08/08/11 @ 1030

## 2011-08-03 NOTE — Patient Instructions (Addendum)
Pregnancy - Second Trimester The second trimester of pregnancy (3 to 6 months) is a period of rapid growth for you and your baby. At the end of the sixth month, your baby is about 9 inches long and weighs 1 1/2 pounds. You will begin to feel the baby move between 18 and 20 weeks of the pregnancy. This is called quickening. Weight gain is faster. A clear fluid (colostrum) may leak out of your breasts. You may feel small contractions of the womb (uterus). This is known as false labor or Braxton-Hicks contractions. This is like a practice for labor when the baby is ready to be born. Usually, the problems with morning sickness have usually passed by the end of your first trimester. Some women develop small dark blotches (called cholasma, mask of pregnancy) on their face that usually goes away after the baby is born. Exposure to the sun makes the blotches worse. Acne may also develop in some pregnant women and pregnant women who have acne, may find that it goes away. PRENATAL EXAMS  Blood work may continue to be done during prenatal exams. These tests are done to check on your health and the probable health of your baby. Blood work is used to follow your blood levels (hemoglobin). Anemia (low hemoglobin) is common during pregnancy. Iron and vitamins are given to help prevent this. You will also be checked for diabetes between 24 and 28 weeks of the pregnancy. Some of the previous blood tests may be repeated.   The size of the uterus is measured during each visit. This is to make sure that the baby is continuing to grow properly according to the dates of the pregnancy.   Your blood pressure is checked every prenatal visit. This is to make sure you are not getting toxemia.   Your urine is checked to make sure you do not have an infection, diabetes or protein in the urine.   Your weight is checked often to make sure gains are happening at the suggested rate. This is to ensure that both you and your baby are  growing normally.   Sometimes, an ultrasound is performed to confirm the proper growth and development of the baby. This is a test which bounces harmless sound waves off the baby so your caregiver can more accurately determine due dates.  Sometimes, a specialized test is done on the amniotic fluid surrounding the baby. This test is called an amniocentesis. The amniotic fluid is obtained by sticking a needle into the belly (abdomen). This is done to check the chromosomes in instances where there is a concern about possible genetic problems with the baby. It is also sometimes done near the end of pregnancy if an early delivery is required. In this case, it is done to help make sure the baby's lungs are mature enough for the baby to live outside of the womb. CHANGES OCCURING IN THE SECOND TRIMESTER OF PREGNANCY Your body goes through many changes during pregnancy. They vary from person to person. Talk to your caregiver about changes you notice that you are concerned about.  During the second trimester, you will likely have an increase in your appetite. It is normal to have cravings for certain foods. This varies from person to person and pregnancy to pregnancy.   Your lower abdomen will begin to bulge.   You may have to urinate more often because the uterus and baby are pressing on your bladder. It is also common to get more bladder infections during pregnancy (  pain with urination). You can help this by drinking lots of fluids and emptying your bladder before and after intercourse.   You may begin to get stretch marks on your hips, abdomen, and breasts. These are normal changes in the body during pregnancy. There are no exercises or medications to take that prevent this change.   You may begin to develop swollen and bulging veins (varicose veins) in your legs. Wearing support hose, elevating your feet for 15 minutes, 3 to 4 times a day and limiting salt in your diet helps lessen the problem.    Heartburn may develop as the uterus grows and pushes up against the stomach. Antacids recommended by your caregiver helps with this problem. Also, eating smaller meals 4 to 5 times a day helps.   Constipation can be treated with a stool softener or adding bulk to your diet. Drinking lots of fluids, vegetables, fruits, and whole grains are helpful.   Exercising is also helpful. If you have been very active up until your pregnancy, most of these activities can be continued during your pregnancy. If you have been less active, it is helpful to start an exercise program such as walking.   Hemorrhoids (varicose veins in the rectum) may develop at the end of the second trimester. Warm sitz baths and hemorrhoid cream recommended by your caregiver helps hemorrhoid problems.   Backaches may develop during this time of your pregnancy. Avoid heavy lifting, wear low heal shoes and practice good posture to help with backache problems.   Some pregnant women develop tingling and numbness of their hand and fingers because of swelling and tightening of ligaments in the wrist (carpel tunnel syndrome). This goes away after the baby is born.   As your breasts enlarge, you may have to get a bigger bra. Get a comfortable, cotton, support bra. Do not get a nursing bra until the last month of the pregnancy if you will be nursing the baby.   You may get a dark line from your belly button to the pubic area called the linea nigra.   You may develop rosy cheeks because of increase blood flow to the face.   You may develop spider looking lines of the face, neck, arms and chest. These go away after the baby is born.  HOME CARE INSTRUCTIONS   It is extremely important to avoid all smoking, herbs, alcohol, and unprescribed drugs during your pregnancy. These chemicals affect the formation and growth of the baby. Avoid these chemicals throughout the pregnancy to ensure the delivery of a healthy infant.   Most of your home  care instructions are the same as suggested for the first trimester of your pregnancy. Keep your caregiver's appointments. Follow your caregiver's instructions regarding medication use, exercise and diet.   During pregnancy, you are providing food for you and your baby. Continue to eat regular, well-balanced meals. Choose foods such as meat, fish, milk and other low fat dairy products, vegetables, fruits, and whole-grain breads and cereals. Your caregiver will tell you of the ideal weight gain.   A physical sexual relationship may be continued up until near the end of pregnancy if there are no other problems. Problems could include early (premature) leaking of amniotic fluid from the membranes, vaginal bleeding, abdominal pain, or other medical or pregnancy problems.   Exercise regularly if there are no restrictions. Check with your caregiver if you are unsure of the safety of some of your exercises. The greatest weight gain will occur in the   last 2 trimesters of pregnancy. Exercise will help you:   Control your weight.   Get you in shape for labor and delivery.   Lose weight after you have the baby.   Wear a good support or jogging bra for breast tenderness during pregnancy. This may help if worn during sleep. Pads or tissues may be used in the bra if you are leaking colostrum.   Do not use hot tubs, steam rooms or saunas throughout the pregnancy.   Wear your seat belt at all times when driving. This protects you and your baby if you are in an accident.   Avoid raw meat, uncooked cheese, cat litter boxes and soil used by cats. These carry germs that can cause birth defects in the baby.   The second trimester is also a good time to visit your dentist for your dental health if this has not been done yet. Getting your teeth cleaned is OK. Use a soft toothbrush. Brush gently during pregnancy.   It is easier to loose urine during pregnancy. Tightening up and strengthening the pelvic muscles will  help with this problem. Practice stopping your urination while you are going to the bathroom. These are the same muscles you need to strengthen. It is also the muscles you would use as if you were trying to stop from passing gas. You can practice tightening these muscles up 10 times a set and repeating this about 3 times per day. Once you know what muscles to tighten up, do not perform these exercises during urination. It is more likely to contribute to an infection by backing up the urine.   Ask for help if you have financial, counseling or nutritional needs during pregnancy. Your caregiver will be able to offer counseling for these needs as well as refer you for other special needs.   Your skin may become oily. If so, wash your face with mild soap, use non-greasy moisturizer and oil or cream based makeup.  MEDICATIONS AND DRUG USE IN PREGNANCY  Take prenatal vitamins as directed. The vitamin should contain 1 milligram of folic acid. Keep all vitamins out of reach of children. Only a couple vitamins or tablets containing iron may be fatal to a baby or young child when ingested.   Avoid use of all medications, including herbs, over-the-counter medications, not prescribed or suggested by your caregiver. Only take over-the-counter or prescription medicines for pain, discomfort, or fever as directed by your caregiver. Do not use aspirin.   Let your caregiver also know about herbs you may be using.   Alcohol is related to a number of birth defects. This includes fetal alcohol syndrome. All alcohol, in any form, should be avoided completely. Smoking will cause low birth rate and premature babies.   Street or illegal drugs are very harmful to the baby. They are absolutely forbidden. A baby born to an addicted mother will be addicted at birth. The baby will go through the same withdrawal an adult does.  SEEK MEDICAL CARE IF:  You have any concerns or worries during your pregnancy. It is better to call with  your questions if you feel they cannot wait, rather than worry about them. SEEK IMMEDIATE MEDICAL CARE IF:   An unexplained oral temperature above 102 F (38.9 C) develops, or as your caregiver suggests.   You have leaking of fluid from the vagina (birth canal). If leaking membranes are suspected, take your temperature and tell your caregiver of this when you call.   There   is vaginal spotting, bleeding, or passing clots. Tell your caregiver of the amount and how many pads are used. Light spotting in pregnancy is common, especially following intercourse.   You develop a bad smelling vaginal discharge with a change in the color from clear to white.   You continue to feel sick to your stomach (nauseated) and have no relief from remedies suggested. You vomit blood or coffee ground-like materials.   You lose more than 2 pounds of weight or gain more than 2 pounds of weight over 1 week, or as suggested by your caregiver.   You notice swelling of your face, hands, feet, or legs.   You get exposed to German measles and have never had them.   You are exposed to fifth disease or chickenpox.   You develop belly (abdominal) pain. Round ligament discomfort is a common non-cancerous (benign) cause of abdominal pain in pregnancy. Your caregiver still must evaluate you.   You develop a bad headache that does not go away.   You develop fever, diarrhea, pain with urination, or shortness of breath.   You develop visual problems, blurry, or double vision.   You fall or are in a car accident or any kind of trauma.   There is mental or physical violence at home.  Document Released: 12/14/2000 Document Revised: 12/09/2010 Document Reviewed: 06/18/2008 [employer] Patient Information 2012 [employer]. 

## 2011-08-04 ENCOUNTER — Encounter: Payer: Self-pay | Admitting: Family Medicine

## 2011-08-08 ENCOUNTER — Ambulatory Visit (HOSPITAL_COMMUNITY)
Admission: RE | Admit: 2011-08-08 | Discharge: 2011-08-08 | Disposition: A | Discharge status: Home / Self Care | Payer: Self-pay | Source: Ambulatory Visit | Attending: Physician Assistant | Admitting: Physician Assistant

## 2011-08-08 DIAGNOSIS — O358XX0 Maternal care for other (suspected) fetal abnormality and damage, not applicable or unspecified: Secondary | ICD-10-CM | POA: Insufficient documentation

## 2011-08-08 DIAGNOSIS — Z1389 Encounter for screening for other disorder: Secondary | ICD-10-CM | POA: Insufficient documentation

## 2011-08-08 DIAGNOSIS — Z363 Encounter for antenatal screening for malformations: Secondary | ICD-10-CM | POA: Insufficient documentation

## 2011-08-11 ENCOUNTER — Encounter: Payer: Self-pay | Admitting: *Deleted

## 2011-08-11 DIAGNOSIS — O99019 Anemia complicating pregnancy, unspecified trimester: Secondary | ICD-10-CM | POA: Insufficient documentation

## 2011-08-18 ENCOUNTER — Ambulatory Visit (INDEPENDENT_AMBULATORY_CARE_PROVIDER_SITE_OTHER): Payer: Self-pay | Admitting: Obstetrics and Gynecology

## 2011-08-18 VITALS — BP 101/63 | Temp 97.3°F | Wt 215.6 lb

## 2011-08-18 DIAGNOSIS — D5720 Sickle-cell/Hb-C disease without crisis: Secondary | ICD-10-CM

## 2011-08-18 DIAGNOSIS — Z3490 Encounter for supervision of normal pregnancy, unspecified, unspecified trimester: Secondary | ICD-10-CM

## 2011-08-18 DIAGNOSIS — B977 Papillomavirus as the cause of diseases classified elsewhere: Secondary | ICD-10-CM

## 2011-08-18 DIAGNOSIS — Z3480 Encounter for supervision of other normal pregnancy, unspecified trimester: Secondary | ICD-10-CM

## 2011-08-18 DIAGNOSIS — O99019 Anemia complicating pregnancy, unspecified trimester: Secondary | ICD-10-CM

## 2011-08-18 LAB — POCT URINALYSIS DIP (DEVICE)
Leukocytes, UA: NEGATIVE
Nitrite: NEGATIVE
Protein, ur: NEGATIVE mg/dL
Urobilinogen, UA: 0.2 mg/dL (ref 0.0–1.0)
pH: 7 (ref 5.0–8.0)

## 2011-08-18 NOTE — Progress Notes (Signed)
P= 79 Occasional vaginal pressure

## 2011-08-18 NOTE — Progress Notes (Signed)
Patient doing well, complaining of round ligament pain, PTL precautions reviewed. 1hr GCT and labs next visit

## 2011-09-08 ENCOUNTER — Ambulatory Visit (INDEPENDENT_AMBULATORY_CARE_PROVIDER_SITE_OTHER): Payer: Self-pay | Admitting: Family Medicine

## 2011-09-08 VITALS — BP 111/66 | Temp 97.9°F | Wt 217.2 lb

## 2011-09-08 DIAGNOSIS — Z3480 Encounter for supervision of other normal pregnancy, unspecified trimester: Secondary | ICD-10-CM

## 2011-09-08 DIAGNOSIS — Z3490 Encounter for supervision of normal pregnancy, unspecified, unspecified trimester: Secondary | ICD-10-CM

## 2011-09-08 DIAGNOSIS — D5720 Sickle-cell/Hb-C disease without crisis: Secondary | ICD-10-CM

## 2011-09-08 LAB — CBC
MCH: 27.4 pg (ref 26.0–34.0)
MCHC: 33.1 g/dL (ref 30.0–36.0)
MCV: 82.7 fL (ref 78.0–100.0)
Platelets: 218 10*3/uL (ref 150–400)
RBC: 3.36 MIL/uL — ABNORMAL LOW (ref 3.87–5.11)
RDW: 19.1 % — ABNORMAL HIGH (ref 11.5–15.5)

## 2011-09-08 LAB — POCT URINALYSIS DIP (DEVICE)
Bilirubin Urine: NEGATIVE
Hgb urine dipstick: NEGATIVE
Nitrite: NEGATIVE
Protein, ur: NEGATIVE mg/dL
pH: 6 (ref 5.0–8.0)

## 2011-09-08 NOTE — Progress Notes (Signed)
Doing well--no complaints--28 wk labs today.

## 2011-09-08 NOTE — Patient Instructions (Signed)
Pregnancy - Third Trimester The third trimester of pregnancy (the last 3 months) is a period of the most rapid growth for you and your baby. The baby approaches a length of 20 inches and a weight of 6 to 10 pounds. The baby is adding on fat and getting ready for life outside your body. While inside, babies have periods of sleeping and waking, suck their thumbs, and hiccups. You can often feel small contractions of the uterus. This is false labor. It is also called Braxton-Hicks contractions. This is like a practice for labor. The usual problems in this stage of pregnancy include more difficulty breathing, swelling of the hands and feet from water retention, and having to urinate more often because of the uterus and baby pressing on your bladder.  PRENATAL EXAMS  Blood work may continue to be done during prenatal exams. These tests are done to check on your health and the probable health of your baby. Blood work is used to follow your blood levels (hemoglobin). Anemia (low hemoglobin) is common during pregnancy. Iron and vitamins are given to help prevent this. You may also continue to be checked for diabetes. Some of the past blood tests may be done again.   The size of the uterus is measured during each visit. This makes sure your baby is growing properly according to your pregnancy dates.   Your blood pressure is checked every prenatal visit. This is to make sure you are not getting toxemia.   Your urine is checked every prenatal visit for infection, diabetes and protein.   Your weight is checked at each visit. This is done to make sure gains are happening at the suggested rate and that you and your baby are growing normally.   Sometimes, an ultrasound is performed to confirm the position and the proper growth and development of the baby. This is a test done that bounces harmless sound waves off the baby so your caregiver can more accurately determine due dates.   Discuss the type of pain  medication and anesthesia you will have during your labor and delivery.   Discuss the possibility and anesthesia if a Cesarean Section might be necessary.   Inform your caregiver if there is any mental or physical violence at home.  Sometimes, a specialized non-stress test, contraction stress test and biophysical profile are done to make sure the baby is not having a problem. Checking the amniotic fluid surrounding the baby is called an amniocentesis. The amniotic fluid is removed by sticking a needle into the belly (abdomen). This is sometimes done near the end of pregnancy if an early delivery is required. In this case, it is done to help make sure the baby's lungs are mature enough for the baby to live outside of the womb. If the lungs are not mature and it is unsafe to deliver the baby, an injection of cortisone medication is given to the mother 1 to 2 days before the delivery. This helps the baby's lungs mature and makes it safer to deliver the baby. CHANGES OCCURING IN THE THIRD TRIMESTER OF PREGNANCY Your body goes through many changes during pregnancy. They vary from person to person. Talk to your caregiver about changes you notice and are concerned about.  During the last trimester, you have probably had an increase in your appetite. It is normal to have cravings for certain foods. This varies from person to person and pregnancy to pregnancy.   You may begin to get stretch marks on your hips,   abdomen, and breasts. These are normal changes in the body during pregnancy. There are no exercises or medications to take which prevent this change.   Constipation may be treated with a stool softener or adding bulk to your diet. Drinking lots of fluids, fiber in vegetables, fruits, and whole grains are helpful.   Exercising is also helpful. If you have been very active up until your pregnancy, most of these activities can be continued during your pregnancy. If you have been less active, it is helpful  to start an exercise program such as walking. Consult your caregiver before starting exercise programs.   Avoid all smoking, alcohol, un-prescribed drugs, herbs and "street drugs" during your pregnancy. These chemicals affect the formation and growth of the baby. Avoid chemicals throughout the pregnancy to ensure the delivery of a healthy infant.   Backache, varicose veins and hemorrhoids may develop or get worse.   You will tire more easily in the third trimester, which is normal.   The baby's movements may be stronger and more often.   You may become short of breath easily.   Your belly button may stick out.   A yellow discharge may leak from your breasts called colostrum.   You may have a bloody mucus discharge. This usually occurs a few days to a week before labor begins.  HOME CARE INSTRUCTIONS   Keep your caregiver's appointments. Follow your caregiver's instructions regarding medication use, exercise, and diet.   During pregnancy, you are providing food for you and your baby. Continue to eat regular, well-balanced meals. Choose foods such as meat, fish, milk and other low fat dairy products, vegetables, fruits, and whole-grain breads and cereals. Your caregiver will tell you of the ideal weight gain.   A physical sexual relationship may be continued throughout pregnancy if there are no other problems such as early (premature) leaking of amniotic fluid from the membranes, vaginal bleeding, or belly (abdominal) pain.   Exercise regularly if there are no restrictions. Check with your caregiver if you are unsure of the safety of your exercises. Greater weight gain will occur in the last 2 trimesters of pregnancy. Exercising helps:   Control your weight.   Get you in shape for labor and delivery.   You lose weight after you deliver.   Rest a lot with legs elevated, or as needed for leg cramps or low back pain.   Wear a good support or jogging bra for breast tenderness during  pregnancy. This may help if worn during sleep. Pads or tissues may be used in the bra if you are leaking colostrum.   Do not use hot tubs, steam rooms, or saunas.   Wear your seat belt when driving. This protects you and your baby if you are in an accident.   Avoid raw meat, cat litter boxes and soil used by cats. These carry germs that can cause birth defects in the baby.   It is easier to loose urine during pregnancy. Tightening up and strengthening the pelvic muscles will help with this problem. You can practice stopping your urination while you are going to the bathroom. These are the same muscles you need to strengthen. It is also the muscles you would use if you were trying to stop from passing gas. You can practice tightening these muscles up 10 times a set and repeating this about 3 times per day. Once you know what muscles to tighten up, do not perform these exercises during urination. It is more likely   to cause an infection by backing up the urine.   Ask for help if you have financial, counseling or nutritional needs during pregnancy. Your caregiver will be able to offer counseling for these needs as well as refer you for other special needs.   Make a list of emergency phone numbers and have them available.   Plan on getting help from family or friends when you go home from the hospital.   Make a trial run to the hospital.   Take prenatal classes with the father to understand, practice and ask questions about the labor and delivery.   Prepare the baby's room/nursery.   Do not travel out of the city unless it is absolutely necessary and with the advice of your caregiver.   Wear only low or no heal shoes to have better balance and prevent falling.  MEDICATIONS AND DRUG USE IN PREGNANCY  Take prenatal vitamins as directed. The vitamin should contain 1 milligram of folic acid. Keep all vitamins out of reach of children. Only a couple vitamins or tablets containing iron may be fatal  to a baby or young child when ingested.   Avoid use of all medications, including herbs, over-the-counter medications, not prescribed or suggested by your caregiver. Only take over-the-counter or prescription medicines for pain, discomfort, or fever as directed by your caregiver. Do not use aspirin, ibuprofen (Motrin, Advil, Nuprin) or naproxen (Aleve) unless OK'd by your caregiver.   Let your caregiver also know about herbs you may be using.   Alcohol is related to a number of birth defects. This includes fetal alcohol syndrome. All alcohol, in any form, should be avoided completely. Smoking will cause low birth rate and premature babies.   Street/illegal drugs are very harmful to the baby. They are absolutely forbidden. A baby born to an addicted mother will be addicted at birth. The baby will go through the same withdrawal an adult does.  SEEK MEDICAL CARE IF: You have any concerns or worries during your pregnancy. It is better to call with your questions if you feel they cannot wait, rather than worry about them. DECISIONS ABOUT CIRCUMCISION You may or may not know the sex of your baby. If you know your baby is a boy, it may be time to think about circumcision. Circumcision is the removal of the foreskin of the penis. This is the skin that covers the sensitive end of the penis. There is no proven medical need for this. Often this decision is made on what is popular at the time or based upon religious beliefs and social issues. You can discuss these issues with your caregiver or pediatrician. SEEK IMMEDIATE MEDICAL CARE IF:   An unexplained oral temperature above 102 F (38.9 C) develops, or as your caregiver suggests.   You have leaking of fluid from the vagina (birth canal). If leaking membranes are suspected, take your temperature and tell your caregiver of this when you call.   There is vaginal spotting, bleeding or passing clots. Tell your caregiver of the amount and how many pads are  used.   You develop a bad smelling vaginal discharge with a change in the color from clear to white.   You develop vomiting that lasts more than 24 hours.   You develop chills or fever.   You develop shortness of breath.   You develop burning on urination.   You loose more than 2 pounds of weight or gain more than 2 pounds of weight or as suggested by your   caregiver.   You notice sudden swelling of your face, hands, and feet or legs.   You develop belly (abdominal) pain. Round ligament discomfort is a common non-cancerous (benign) cause of abdominal pain in pregnancy. Your caregiver still must evaluate you.   You develop a severe headache that does not go away.   You develop visual problems, blurred or double vision.   If you have not felt your baby move for more than 1 hour. If you think the baby is not moving as much as usual, eat something with sugar in it and lie down on your left side for an hour. The baby should move at least 4 to 5 times per hour. Call right away if your baby moves less than that.   You fall, are in a car accident or any kind of trauma.   There is mental or physical violence at home.  Document Released: 12/14/2000 Document Revised: 12/09/2010 Document Reviewed: 06/18/2008 [employer] Patient Information 2012 [employer]. Breastfeeding BENEFITS OF BREASTFEEDING For the baby  The first milk (colostrum) helps the baby's digestive system function better.   There are antibodies from the mother in the milk that help the baby fight off infections.   The baby has a lower incidence of asthma, allergies, and SIDS (sudden infant death syndrome).   The nutrients in breast milk are better than formulas for the baby and helps the baby's brain grow better.   Babies who breastfeed have less gas, colic, and constipation.  For the mother  Breastfeeding helps develop a very special bond between mother and baby.   It is more convenient, always available at the  correct temperature and cheaper than formula feeding.   It burns calories in the mother and helps with losing weight that was gained during pregnancy.   It makes the uterus contract back down to normal size faster and slows bleeding following delivery.   Breastfeeding mothers have a lower risk of developing breast cancer.  NURSE FREQUENTLY  A healthy, full-term baby may breastfeed as often as every hour or space his or her feedings to every 3 hours.   How often to nurse will vary from baby to baby. Watch your baby for signs of hunger, not the clock.   Nurse as often as the baby requests, or when you feel the need to reduce the fullness of your breasts.   Awaken the baby if it has been 3 to 4 hours since the last feeding.   Frequent feeding will help the mother make more milk and will prevent problems like sore nipples and engorgement of the breasts.  BABY'S POSITION AT THE BREAST  Whether lying down or sitting, be sure that the baby's tummy is facing your tummy.   Support the breast with 4 fingers underneath the breast and the thumb above. Make sure your fingers are well away from the nipple and baby's mouth.   Stroke the baby's lips and cheek closest to the breast gently with your finger or nipple.   When the baby's mouth is open wide enough, place all of your nipple and as much of the dark area around the nipple as possible into your baby's mouth.   Pull the baby in close so the tip of the nose and the baby's cheeks touch the breast during the feeding.  FEEDINGS  The length of each feeding varies from baby to baby and from feeding to feeding.   The baby must suck about 2 to 3 minutes for your milk   to get to him or her. This is called a "let down." For this reason, allow the baby to feed on each breast as long as he or she wants. Your baby will end the feeding when he or she has received the right balance of nutrients.   To break the suction, put your finger into the corner of the  baby's mouth and slide it between his or her gums before removing your breast from his or her mouth. This will help prevent sore nipples.  REDUCING BREAST ENGORGEMENT  In the first week after your baby is born, you may experience signs of breast engorgement. When breasts are engorged, they feel heavy, warm, full, and may be tender to the touch. You can reduce engorgement if you:   Nurse frequently, every 2 to 3 hours. Mothers who breastfeed early and often have fewer problems with engorgement.   Place light ice packs on your breasts between feedings. This reduces swelling. Wrap the ice packs in a lightweight towel to protect your skin.   Apply moist hot packs to your breast for 5 to 10 minutes before each feeding. This increases circulation and helps the milk flow.   Gently massage your breast before and during the feeding.   Make sure that the baby empties at least one breast at every feeding before switching sides.   Use a breast pump to empty the breasts if your baby is sleepy or not nursing well. You may also want to pump if you are returning to work or or you feel you are getting engorged.   Avoid bottle feeds, pacifiers or supplemental feedings of water or juice in place of breastfeeding.   Be sure the baby is latched on and positioned properly while breastfeeding.   Prevent fatigue, stress, and anemia.   Wear a supportive bra, avoiding underwire styles.   Eat a balanced diet with enough fluids.  If you follow these suggestions, your engorgement should improve in 24 to 48 hours. If you are still experiencing difficulty, call your lactation consultant or caregiver. IS MY BABY GETTING ENOUGH MILK? Sometimes, mothers worry about whether their babies are getting enough milk. You can be assured that your baby is getting enough milk if:  The baby is actively sucking and you hear swallowing.   The baby nurses at least 8 to 12 times in a 24 hour time period. Nurse your baby until he or  she unlatches or falls asleep at the first breast (at least 10 to 20 minutes), then offer the second side.   The baby is wetting 5 to 6 disposable diapers (6 to 8 cloth diapers) in a 24 hour period by 5 to 6 days of age.   The baby is having at least 2 to 3 stools every 24 hours for the first few months. Breast milk is all the food your baby needs. It is not necessary for your baby to have water or formula. In fact, to help your breasts make more milk, it is best not to give your baby supplemental feedings during the early weeks.   The stool should be soft and yellow.   The baby should gain 4 to 7 ounces per week after he is 4 days old.  TAKE CARE OF YOURSELF Take care of your breasts by:  Bathing or showering daily.   Avoiding the use of soaps on your nipples.   Start feedings on your left breast at one feeding and on your right breast at the next feeding.     You will notice an increase in your milk supply 2 to 5 days after delivery. You may feel some discomfort from engorgement, which makes your breasts very firm and often tender. Engorgement "peaks" out within 24 to 48 hours. In the meantime, apply warm moist towels to your breasts for 5 to 10 minutes before feeding. Gentle massage and expression of some milk before feeding will soften your breasts, making it easier for your baby to latch on. Wear a well fitting nursing bra and air dry your nipples for 10 to 15 minutes after each feeding.   Only use cotton bra pads.   Only use pure lanolin on your nipples after nursing. You do not need to wash it off before nursing.  Take care of yourself by:   Eating well-balanced meals and nutritious snacks.   Drinking milk, fruit juice, and water to satisfy your thirst (about 8 glasses a day).   Getting plenty of rest.   Increasing calcium in your diet (1200 mg a day).   Avoiding foods that you notice affect the baby in a bad way.  SEEK MEDICAL CARE IF:   You have any questions or difficulty  with breastfeeding.   You need help.   You have a hard, red, sore area on your breast, accompanied by a fever of 100.5 F (38.1 C) or more.   Your baby is too sleepy to eat well or is having trouble sleeping.   Your baby is wetting less than 6 diapers per day, by 5 days of age.   Your baby's skin or white part of his or her eyes is more yellow than it was in the hospital.   You feel depressed.  Document Released: 12/20/2004 Document Revised: 12/09/2010 Document Reviewed: 08/04/2008 [employer] Patient Information 2012 [employer]. 

## 2011-09-08 NOTE — Progress Notes (Signed)
Pulse- 85 Pt c/o decreased appetite

## 2011-09-12 ENCOUNTER — Telehealth: Payer: Self-pay | Admitting: *Deleted

## 2011-09-12 NOTE — Telephone Encounter (Signed)
Called pt and left message to return our call for important test results and information.

## 2011-09-12 NOTE — Telephone Encounter (Signed)
Message copied by Jill Side on Mon Sep 12, 2011  3:43 PM ------      Message from: Reva Bores      Created: Fri Sep 09, 2011  9:58 AM       Pt. Is anemic--on iron tid--make sure she is taking as directed.

## 2011-09-13 ENCOUNTER — Encounter: Payer: Self-pay | Admitting: Family Medicine

## 2011-09-13 LAB — GLUCOSE TOLERANCE, 1 HOUR

## 2011-09-14 NOTE — Telephone Encounter (Signed)
Pt informed states that she hasn't been taking it everyday but will start taking it three times a day.

## 2011-09-22 ENCOUNTER — Ambulatory Visit (INDEPENDENT_AMBULATORY_CARE_PROVIDER_SITE_OTHER): Payer: Self-pay | Admitting: Obstetrics and Gynecology

## 2011-09-22 ENCOUNTER — Encounter: Payer: Self-pay | Admitting: Obstetrics and Gynecology

## 2011-09-22 VITALS — BP 115/63 | Temp 98.2°F | Wt 219.6 lb

## 2011-09-22 DIAGNOSIS — O99019 Anemia complicating pregnancy, unspecified trimester: Secondary | ICD-10-CM

## 2011-09-22 DIAGNOSIS — B977 Papillomavirus as the cause of diseases classified elsewhere: Secondary | ICD-10-CM

## 2011-09-22 DIAGNOSIS — D5720 Sickle-cell/Hb-C disease without crisis: Secondary | ICD-10-CM

## 2011-09-22 LAB — POCT URINALYSIS DIP (DEVICE)
Bilirubin Urine: NEGATIVE
Hgb urine dipstick: NEGATIVE
Nitrite: NEGATIVE
Protein, ur: NEGATIVE mg/dL
pH: 5.5 (ref 5.0–8.0)

## 2011-09-22 NOTE — Progress Notes (Signed)
U/S scheduled 10/06/11 at 915 am.

## 2011-09-22 NOTE — Progress Notes (Signed)
P= 79 Pressure in lower pelvis, occasional contractions.

## 2011-09-22 NOTE — Progress Notes (Signed)
Reviewed 1 hr 102. RLP discussed. No joint pains. Fetus active. FM awareness reviewed. [place] scheduled.

## 2011-09-22 NOTE — Patient Instructions (Signed)

## 2011-10-06 ENCOUNTER — Ambulatory Visit (INDEPENDENT_AMBULATORY_CARE_PROVIDER_SITE_OTHER): Payer: Self-pay | Admitting: Family Medicine

## 2011-10-06 ENCOUNTER — Ambulatory Visit (HOSPITAL_COMMUNITY)
Admission: RE | Admit: 2011-10-06 | Discharge: 2011-10-06 | Disposition: A | Discharge status: Home / Self Care | Payer: Self-pay | Source: Ambulatory Visit | Attending: Obstetrics and Gynecology | Admitting: Obstetrics and Gynecology

## 2011-10-06 VITALS — BP 109/54 | Temp 98.3°F | Wt 222.0 lb

## 2011-10-06 DIAGNOSIS — O99019 Anemia complicating pregnancy, unspecified trimester: Secondary | ICD-10-CM

## 2011-10-06 DIAGNOSIS — O0990 Supervision of high risk pregnancy, unspecified, unspecified trimester: Secondary | ICD-10-CM

## 2011-10-06 DIAGNOSIS — D5720 Sickle-cell/Hb-C disease without crisis: Secondary | ICD-10-CM

## 2011-10-06 DIAGNOSIS — Z23 Encounter for immunization: Secondary | ICD-10-CM

## 2011-10-06 DIAGNOSIS — Z3689 Encounter for other specified antenatal screening: Secondary | ICD-10-CM | POA: Insufficient documentation

## 2011-10-06 DIAGNOSIS — O3660X0 Maternal care for excessive fetal growth, unspecified trimester, not applicable or unspecified: Secondary | ICD-10-CM | POA: Insufficient documentation

## 2011-10-06 LAB — POCT URINALYSIS DIP (DEVICE)
Ketones, ur: NEGATIVE mg/dL
Protein, ur: NEGATIVE mg/dL
Specific Gravity, Urine: 1.01 (ref 1.005–1.030)
pH: 6 (ref 5.0–8.0)

## 2011-10-06 MED ORDER — INFLUENZA VIRUS VACC SPLIT PF IM SUSP
0.5000 mL | Freq: Once | INTRAMUSCULAR | Status: AC
  Administered 2011-10-06: 0.5 mL via INTRAMUSCULAR

## 2011-10-06 NOTE — Addendum Note (Signed)
Addended by: Franchot Mimes on: 10/06/2011 09:49 AM   Modules accepted: Orders

## 2011-10-06 NOTE — Progress Notes (Signed)
Complains irregular contractions becoming more frequent and uncomfortable at night. No vaginal pressure, bleeding or loss of fluid. GC/wet prep done. Cervix visually closed and closed on SVE. Baby moving well. Has F/U sono today for fetal anatomy not visualized.  Flu shot administered.

## 2011-10-06 NOTE — Patient Instructions (Signed)
Pregnancy - Third Trimester The third trimester of pregnancy (the last 3 months) is a period of the most rapid growth for you and your baby. The baby approaches a length of 20 inches and a weight of 6 to 10 pounds. The baby is adding on fat and getting ready for life outside your body. While inside, babies have periods of sleeping and waking, suck their thumbs, and hiccups. You can often feel small contractions of the uterus. This is false labor. It is also called Braxton-Hicks contractions. This is like a practice for labor. The usual problems in this stage of pregnancy include more difficulty breathing, swelling of the hands and feet from water retention, and having to urinate more often because of the uterus and baby pressing on your bladder.  PRENATAL EXAMS  Blood work may continue to be done during prenatal exams. These tests are done to check on your health and the probable health of your baby. Blood work is used to follow your blood levels (hemoglobin). Anemia (low hemoglobin) is common during pregnancy. Iron and vitamins are given to help prevent this. You may also continue to be checked for diabetes. Some of the past blood tests may be done again.  The size of the uterus is measured during each visit. This makes sure your baby is growing properly according to your pregnancy dates.  Your blood pressure is checked every prenatal visit. This is to make sure you are not getting toxemia.  Your urine is checked every prenatal visit for infection, diabetes and protein.  Your weight is checked at each visit. This is done to make sure gains are happening at the suggested rate and that you and your baby are growing normally.  Sometimes, an ultrasound is performed to confirm the position and the proper growth and development of the baby. This is a test done that bounces harmless sound waves off the baby so your caregiver can more accurately determine due dates.  Discuss the type of pain medication and  anesthesia you will have during your labor and delivery.  Discuss the possibility and anesthesia if a Cesarean Section might be necessary.  Inform your caregiver if there is any mental or physical violence at home. Sometimes, a specialized non-stress test, contraction stress test and biophysical profile are done to make sure the baby is not having a problem. Checking the amniotic fluid surrounding the baby is called an amniocentesis. The amniotic fluid is removed by sticking a needle into the belly (abdomen). This is sometimes done near the end of pregnancy if an early delivery is required. In this case, it is done to help make sure the baby's lungs are mature enough for the baby to live outside of the womb. If the lungs are not mature and it is unsafe to deliver the baby, an injection of cortisone medication is given to the mother 1 to 2 days before the delivery. This helps the baby's lungs mature and makes it safer to deliver the baby. CHANGES OCCURING IN THE THIRD TRIMESTER OF PREGNANCY Your body goes through many changes during pregnancy. They vary from person to person. Talk to your caregiver about changes you notice and are concerned about.  During the last trimester, you have probably had an increase in your appetite. It is normal to have cravings for certain foods. This varies from person to person and pregnancy to pregnancy.  You may begin to get stretch marks on your hips, abdomen, and breasts. These are normal changes in the body   during pregnancy. There are no exercises or medications to take which prevent this change.  Constipation may be treated with a stool softener or adding bulk to your diet. Drinking lots of fluids, fiber in vegetables, fruits, and whole grains are helpful.  Exercising is also helpful. If you have been very active up until your pregnancy, most of these activities can be continued during your pregnancy. If you have been less active, it is helpful to start an exercise  program such as walking. Consult your caregiver before starting exercise programs.  Avoid all smoking, alcohol, un-prescribed drugs, herbs and "street drugs" during your pregnancy. These chemicals affect the formation and growth of the baby. Avoid chemicals throughout the pregnancy to ensure the delivery of a healthy infant.  Backache, varicose veins and hemorrhoids may develop or get worse.  You will tire more easily in the third trimester, which is normal.  The baby's movements may be stronger and more often.  You may become short of breath easily.  Your belly button may stick out.  A yellow discharge may leak from your breasts called colostrum.  You may have a bloody mucus discharge. This usually occurs a few days to a week before labor begins. HOME CARE INSTRUCTIONS   Keep your caregiver's appointments. Follow your caregiver's instructions regarding medication use, exercise, and diet.  During pregnancy, you are providing food for you and your baby. Continue to eat regular, well-balanced meals. Choose foods such as meat, fish, milk and other low fat dairy products, vegetables, fruits, and whole-grain breads and cereals. Your caregiver will tell you of the ideal weight gain.  A physical sexual relationship may be continued throughout pregnancy if there are no other problems such as early (premature) leaking of amniotic fluid from the membranes, vaginal bleeding, or belly (abdominal) pain.  Exercise regularly if there are no restrictions. Check with your caregiver if you are unsure of the safety of your exercises. Greater weight gain will occur in the last 2 trimesters of pregnancy. Exercising helps:  Control your weight.  Get you in shape for labor and delivery.  You lose weight after you deliver.  Rest a lot with legs elevated, or as needed for leg cramps or low back pain.  Wear a good support or jogging bra for breast tenderness during pregnancy. This may help if worn during  sleep. Pads or tissues may be used in the bra if you are leaking colostrum.  Do not use hot tubs, steam rooms, or saunas.  Wear your seat belt when driving. This protects you and your baby if you are in an accident.  Avoid raw meat, cat litter boxes and soil used by cats. These carry germs that can cause birth defects in the baby.  It is easier to loose urine during pregnancy. Tightening up and strengthening the pelvic muscles will help with this problem. You can practice stopping your urination while you are going to the bathroom. These are the same muscles you need to strengthen. It is also the muscles you would use if you were trying to stop from passing gas. You can practice tightening these muscles up 10 times a set and repeating this about 3 times per day. Once you know what muscles to tighten up, do not perform these exercises during urination. It is more likely to cause an infection by backing up the urine.  Ask for help if you have financial, counseling or nutritional needs during pregnancy. Your caregiver will be able to offer counseling for these   needs as well as refer you for other special needs.  Make a list of emergency phone numbers and have them available.  Plan on getting help from family or friends when you go home from the hospital.  Make a trial run to the hospital.  Take prenatal classes with the father to understand, practice and ask questions about the labor and delivery.  Prepare the baby's room/nursery.  Do not travel out of the city unless it is absolutely necessary and with the advice of your caregiver.  Wear only low or no heal shoes to have better balance and prevent falling. MEDICATIONS AND DRUG USE IN PREGNANCY  Take prenatal vitamins as directed. The vitamin should contain 1 milligram of folic acid. Keep all vitamins out of reach of children. Only a couple vitamins or tablets containing iron may be fatal to a baby or young child when ingested.  Avoid use  of all medications, including herbs, over-the-counter medications, not prescribed or suggested by your caregiver. Only take over-the-counter or prescription medicines for pain, discomfort, or fever as directed by your caregiver. Do not use aspirin, ibuprofen (Motrin, Advil, Nuprin) or naproxen (Aleve) unless OK'd by your caregiver.  Let your caregiver also know about herbs you may be using.  Alcohol is related to a number of birth defects. This includes fetal alcohol syndrome. All alcohol, in any form, should be avoided completely. Smoking will cause low birth rate and premature babies.  Street/illegal drugs are very harmful to the baby. They are absolutely forbidden. A baby born to an addicted mother will be addicted at birth. The baby will go through the same withdrawal an adult does. SEEK MEDICAL CARE IF: You have any concerns or worries during your pregnancy. It is better to call with your questions if you feel they cannot wait, rather than worry about them. DECISIONS ABOUT CIRCUMCISION You may or may not know the sex of your baby. If you know your baby is a boy, it may be time to think about circumcision. Circumcision is the removal of the foreskin of the penis. This is the skin that covers the sensitive end of the penis. There is no proven medical need for this. Often this decision is made on what is popular at the time or based upon religious beliefs and social issues. You can discuss these issues with your caregiver or pediatrician. SEEK IMMEDIATE MEDICAL CARE IF:   An unexplained oral temperature above 102 F (38.9 C) develops, or as your caregiver suggests.  You have leaking of fluid from the vagina (birth canal). If leaking membranes are suspected, take your temperature and tell your caregiver of this when you call.  There is vaginal spotting, bleeding or passing clots. Tell your caregiver of the amount and how many pads are used.  You develop a bad smelling vaginal discharge with  a change in the color from clear to white.  You develop vomiting that lasts more than 24 hours.  You develop chills or fever.  You develop shortness of breath.  You develop burning on urination.  You loose more than 2 pounds of weight or gain more than 2 pounds of weight or as suggested by your caregiver.  You notice sudden swelling of your face, hands, and feet or legs.  You develop belly (abdominal) pain. Round ligament discomfort is a common non-cancerous (benign) cause of abdominal pain in pregnancy. Your caregiver still must evaluate you.  You develop a severe headache that does not go away.  You develop visual   problems, blurred or double vision.  If you have not felt your baby move for more than 1 hour. If you think the baby is not moving as much as usual, eat something with sugar in it and lie down on your left side for an hour. The baby should move at least 4 to 5 times per hour. Call right away if your baby moves less than that.  You fall, are in a car accident or any kind of trauma.  There is mental or physical violence at home. Document Released: 12/14/2000 Document Revised: 03/14/2011 Document Reviewed: 06/18/2008 [employer] Patient Information 2013 [employer].  Braxton Hicks Contractions Pregnancy is commonly associated with contractions of the uterus throughout the pregnancy. Towards the end of pregnancy (32 to 34 weeks), these contractions (Braxton Hicks) can develop more often and may become more forceful. This is not true labor because these contractions do not result in opening (dilatation) and thinning of the cervix. They are sometimes difficult to tell apart from true labor because these contractions can be forceful and people have different pain tolerances. You should not feel embarrassed if you go to the hospital with false labor. Sometimes, the only way to tell if you are in true labor is for your caregiver to follow the changes in the cervix. How to tell the  difference between true and false labor:  False labor.  The contractions of false labor are usually shorter, irregular and not as hard as those of true labor.  They are often felt in the front of the lower abdomen and in the groin.  They may leave with walking around or changing positions while lying down.  They get weaker and are shorter lasting as time goes on.  These contractions are usually irregular.  They do not usually become progressively stronger, regular and closer together as with true labor.  True labor.  Contractions in true labor last 30 to 70 seconds, become very regular, usually become more intense, and increase in frequency.  They do not go away with walking.  The discomfort is usually felt in the top of the uterus and spreads to the lower abdomen and low back.  True labor can be determined by your caregiver with an exam. This will show that the cervix is dilating and getting thinner. If there are no prenatal problems or other health problems associated with the pregnancy, it is completely safe to be sent home with false labor and await the onset of true labor. HOME CARE INSTRUCTIONS   Keep up with your usual exercises and instructions.  Take medications as directed.  Keep your regular prenatal appointment.  Eat and drink lightly if you think you are going into labor.  If BH contractions are making you uncomfortable:  Change your activity position from lying down or resting to walking/walking to resting.  Sit and rest in a tub of warm water.  Drink 2 to 3 glasses of water. Dehydration may cause B-H contractions.  Do slow and deep breathing several times an hour. SEEK IMMEDIATE MEDICAL CARE IF:   Your contractions continue to become stronger, more regular, and closer together.  You have a gushing, burst or leaking of fluid from the vagina.  An oral temperature above 102 F (38.9 C) develops.  You have passage of blood-tinged mucus.  You develop  vaginal bleeding.  You develop continuous belly (abdominal) pain.  You have low back pain that you never had before.  You feel the baby's head pushing down causing pelvic   pressure.  The baby is not moving as much as it used to. Document Released: 12/20/2004 Document Revised: 03/14/2011 Document Reviewed: 06/13/2008 [employer] Patient Information 2013 [employer].  

## 2011-10-06 NOTE — Progress Notes (Signed)
P=81. Patient denies need for interpreter. C/o feeling contractions some during the day, but worse at night, states hard to sleep because of the contractions. C/o pelvic pressure sometimes.

## 2011-10-07 LAB — WET PREP, GENITAL: Trich, Wet Prep: NONE SEEN

## 2011-10-10 ENCOUNTER — Telehealth: Payer: Self-pay

## 2011-10-10 DIAGNOSIS — B9689 Other specified bacterial agents as the cause of diseases classified elsewhere: Secondary | ICD-10-CM

## 2011-10-10 MED ORDER — METRONIDAZOLE 0.75 % VA GEL: 1.0000 | Freq: Every day | VAGINAL | Status: DC

## 2011-10-10 NOTE — Telephone Encounter (Signed)
Spoke with patient. Informed her of BV diagnosis. Explained BV. Patient voiced understanding. Patient prefers to treat with gel. Will call in Metrogel to her pharmacy. Confirmed pharmacy with patient. Patient does not voice any further questions at this time.

## 2011-10-10 NOTE — Telephone Encounter (Signed)
Message copied by Faythe Casa on Mon Oct 10, 2011  8:22 AM ------      Message from: FERRY, [place]      Created: Fri Oct 07, 2011  1:44 PM       Pt needs to be treated for bacterial vaginosis. Please give her option of PO or vaginal gel and let me know if/what I need to order. THanks.

## 2011-10-10 NOTE — Telephone Encounter (Signed)
Called pt and left message to return the call to the clinics.  

## 2011-10-20 ENCOUNTER — Ambulatory Visit (INDEPENDENT_AMBULATORY_CARE_PROVIDER_SITE_OTHER): Payer: Self-pay | Admitting: Advanced Practice Midwife

## 2011-10-20 VITALS — BP 108/67 | Temp 98.5°F | Wt 223.0 lb

## 2011-10-20 DIAGNOSIS — O99019 Anemia complicating pregnancy, unspecified trimester: Secondary | ICD-10-CM

## 2011-10-20 LAB — POCT URINALYSIS DIP (DEVICE)
Bilirubin Urine: NEGATIVE
Glucose, UA: NEGATIVE mg/dL
Nitrite: NEGATIVE

## 2011-10-20 MED ORDER — TETANUS-DIPHTH-ACELL PERTUSSIS 5-2.5-18.5 LF-MCG/0.5 IM SUSP
0.5000 mL | Freq: Once | INTRAMUSCULAR | Status: AC
  Administered 2011-10-20: 0.5 mL via INTRAMUSCULAR

## 2011-10-20 NOTE — Progress Notes (Signed)
Doing well.  Good fetal movement, denies vaginal bleeding, LOF, regular contractions.  Irregular contractions are painful and she is having constant pelvic pressure and back pain.  Cervix closed, posterior.  Discussed pregnancy support belt, good sleeping positions, increased PO fluids.

## 2011-10-20 NOTE — Progress Notes (Signed)
P=80. states pelvic pain/pressure and contractions are worse, more painful. States has contractions all day. Desires tdap today

## 2011-10-20 NOTE — Addendum Note (Signed)
Addended by: Freddi Starr on: 10/20/2011 10:21 AM   Modules accepted: Orders

## 2011-11-03 ENCOUNTER — Ambulatory Visit (INDEPENDENT_AMBULATORY_CARE_PROVIDER_SITE_OTHER): Payer: Self-pay | Admitting: Family Medicine

## 2011-11-03 VITALS — BP 121/70 | Temp 97.1°F | Wt 225.9 lb

## 2011-11-03 DIAGNOSIS — O0990 Supervision of high risk pregnancy, unspecified, unspecified trimester: Secondary | ICD-10-CM

## 2011-11-03 DIAGNOSIS — O99019 Anemia complicating pregnancy, unspecified trimester: Secondary | ICD-10-CM

## 2011-11-03 DIAGNOSIS — B977 Papillomavirus as the cause of diseases classified elsewhere: Secondary | ICD-10-CM

## 2011-11-03 LAB — POCT URINALYSIS DIP (DEVICE)
Bilirubin Urine: NEGATIVE
Ketones, ur: NEGATIVE mg/dL

## 2011-11-03 NOTE — Progress Notes (Signed)
Still having frequent contractions - Taylor Roman with some vaginal pressure. No change from previous visits. Not sleeping well. Recommend tylenol/benadryl for comfort/sleep. No bleeding or fluid leak. Declines cervical exam today. Hx GDM prior pregnancy. Early and mid 1 hour GTT normal. Weight gain to date is reasonable. GBS, GC/chlamydia done today.

## 2011-11-03 NOTE — Progress Notes (Signed)
P = 88 Pressure in lower abdomen; contractions "all day" sometimes closer together not always measurable.

## 2011-11-03 NOTE — Patient Instructions (Signed)
Braxton Hicks Contractions Pregnancy is commonly associated with contractions of the uterus throughout the pregnancy. Towards the end of pregnancy (32 to 34 weeks), these contractions (Braxton Hicks) can develop more often and may become more forceful. This is not true labor because these contractions do not result in opening (dilatation) and thinning of the cervix. They are sometimes difficult to tell apart from true labor because these contractions can be forceful and people have different pain tolerances. You should not feel embarrassed if you go to the hospital with false labor. Sometimes, the only way to tell if you are in true labor is for your caregiver to follow the changes in the cervix. How to tell the difference between true and false labor:  False labor.  The contractions of false labor are usually shorter, irregular and not as hard as those of true labor.  They are often felt in the front of the lower abdomen and in the groin.  They may leave with walking around or changing positions while lying down.  They get weaker and are shorter lasting as time goes on.  These contractions are usually irregular.  They do not usually become progressively stronger, regular and closer together as with true labor.  True labor.  Contractions in true labor last 30 to 70 seconds, become very regular, usually become more intense, and increase in frequency.  They do not go away with walking.  The discomfort is usually felt in the top of the uterus and spreads to the lower abdomen and low back.  True labor can be determined by your caregiver with an exam. This will show that the cervix is dilating and getting thinner. If there are no prenatal problems or other health problems associated with the pregnancy, it is completely safe to be sent home with false labor and await the onset of true labor. HOME CARE INSTRUCTIONS   Keep up with your usual exercises and instructions.  Take medications as  directed.  Keep your regular prenatal appointment.  Eat and drink lightly if you think you are going into labor.  If BH contractions are making you uncomfortable:  Change your activity position from lying down or resting to walking/walking to resting.  Sit and rest in a tub of warm water.  Drink 2 to 3 glasses of water. Dehydration may cause B-H contractions.  Do slow and deep breathing several times an hour. SEEK IMMEDIATE MEDICAL CARE IF:   Your contractions continue to become stronger, more regular, and closer together.  You have a gushing, burst or leaking of fluid from the vagina.  An oral temperature above 102 F (38.9 C) develops.  You have passage of blood-tinged mucus.  You develop vaginal bleeding.  You develop continuous belly (abdominal) pain.  You have low back pain that you never had before.  You feel the baby's head pushing down causing pelvic pressure.  The baby is not moving as much as it used to. Document Released: 12/20/2004 Document Revised: 03/14/2011 Document Reviewed: 06/13/2008 Exit[employer] Patient Information 2013 [employer].  Pregnancy - Third Trimester The third trimester of pregnancy (the last 3 months) is a period of the most rapid growth for you and your baby. The baby approaches a length of 20 inches and a weight of 6 to 10 pounds. The baby is adding on fat and getting ready for life outside your body. While inside, babies have periods of sleeping and waking, suck their thumbs, and hiccups. You can often feel small contractions of the uterus.   This is false labor. It is also called Braxton-Hicks contractions. This is like a practice for labor. The usual problems in this stage of pregnancy include more difficulty breathing, swelling of the hands and feet from water retention, and having to urinate more often because of the uterus and baby pressing on your bladder.  PRENATAL EXAMS  Blood work may continue to be done during prenatal exams. These  tests are done to check on your health and the probable health of your baby. Blood work is used to follow your blood levels (hemoglobin). Anemia (low hemoglobin) is common during pregnancy. Iron and vitamins are given to help prevent this. You may also continue to be checked for diabetes. Some of the past blood tests may be done again.  The size of the uterus is measured during each visit. This makes sure your baby is growing properly according to your pregnancy dates.  Your blood pressure is checked every prenatal visit. This is to make sure you are not getting toxemia.  Your urine is checked every prenatal visit for infection, diabetes and protein.  Your weight is checked at each visit. This is done to make sure gains are happening at the suggested rate and that you and your baby are growing normally.  Sometimes, an ultrasound is performed to confirm the position and the proper growth and development of the baby. This is a test done that bounces harmless sound waves off the baby so your caregiver can more accurately determine due dates.  Discuss the type of pain medication and anesthesia you will have during your labor and delivery.  Discuss the possibility and anesthesia if a Cesarean Section might be necessary.  Inform your caregiver if there is any mental or physical violence at home. Sometimes, a specialized non-stress test, contraction stress test and biophysical profile are done to make sure the baby is not having a problem. Checking the amniotic fluid surrounding the baby is called an amniocentesis. The amniotic fluid is removed by sticking a needle into the belly (abdomen). This is sometimes done near the end of pregnancy if an early delivery is required. In this case, it is done to help make sure the baby's lungs are mature enough for the baby to live outside of the womb. If the lungs are not mature and it is unsafe to deliver the baby, an injection of cortisone medication is given to the  mother 1 to 2 days before the delivery. This helps the baby's lungs mature and makes it safer to deliver the baby. CHANGES OCCURING IN THE THIRD TRIMESTER OF PREGNANCY Your body goes through many changes during pregnancy. They vary from person to person. Talk to your caregiver about changes you notice and are concerned about.  During the last trimester, you have probably had an increase in your appetite. It is normal to have cravings for certain foods. This varies from person to person and pregnancy to pregnancy.  You may begin to get stretch marks on your hips, abdomen, and breasts. These are normal changes in the body during pregnancy. There are no exercises or medications to take which prevent this change.  Constipation may be treated with a stool softener or adding bulk to your diet. Drinking lots of fluids, fiber in vegetables, fruits, and whole grains are helpful.  Exercising is also helpful. If you have been very active up until your pregnancy, most of these activities can be continued during your pregnancy. If you have been less active, it is helpful to start   an exercise program such as walking. Consult your caregiver before starting exercise programs.  Avoid all smoking, alcohol, un-prescribed drugs, herbs and "street drugs" during your pregnancy. These chemicals affect the formation and growth of the baby. Avoid chemicals throughout the pregnancy to ensure the delivery of a healthy infant.  Backache, varicose veins and hemorrhoids may develop or get worse.  You will tire more easily in the third trimester, which is normal.  The baby's movements may be stronger and more often.  You may become short of breath easily.  Your belly button may stick out.  A yellow discharge may leak from your breasts called colostrum.  You may have a bloody mucus discharge. This usually occurs a few days to a week before labor begins. HOME CARE INSTRUCTIONS   Keep your caregiver's appointments.  Follow your caregiver's instructions regarding medication use, exercise, and diet.  During pregnancy, you are providing food for you and your baby. Continue to eat regular, well-balanced meals. Choose foods such as meat, fish, milk and other low fat dairy products, vegetables, fruits, and whole-grain breads and cereals. Your caregiver will tell you of the ideal weight gain.  A physical sexual relationship may be continued throughout pregnancy if there are no other problems such as early (premature) leaking of amniotic fluid from the membranes, vaginal bleeding, or belly (abdominal) pain.  Exercise regularly if there are no restrictions. Check with your caregiver if you are unsure of the safety of your exercises. Greater weight gain will occur in the last 2 trimesters of pregnancy. Exercising helps:  Control your weight.  Get you in shape for labor and delivery.  You lose weight after you deliver.  Rest a lot with legs elevated, or as needed for leg cramps or low back pain.  Wear a good support or jogging bra for breast tenderness during pregnancy. This may help if worn during sleep. Pads or tissues may be used in the bra if you are leaking colostrum.  Do not use hot tubs, steam rooms, or saunas.  Wear your seat belt when driving. This protects you and your baby if you are in an accident.  Avoid raw meat, cat litter boxes and soil used by cats. These carry germs that can cause birth defects in the baby.  It is easier to loose urine during pregnancy. Tightening up and strengthening the pelvic muscles will help with this problem. You can practice stopping your urination while you are going to the bathroom. These are the same muscles you need to strengthen. It is also the muscles you would use if you were trying to stop from passing gas. You can practice tightening these muscles up 10 times a set and repeating this about 3 times per day. Once you know what muscles to tighten up, do not perform  these exercises during urination. It is more likely to cause an infection by backing up the urine.  Ask for help if you have financial, counseling or nutritional needs during pregnancy. Your caregiver will be able to offer counseling for these needs as well as refer you for other special needs.  Make a list of emergency phone numbers and have them available.  Plan on getting help from family or friends when you go home from the hospital.  Make a trial run to the hospital.  Take prenatal classes with the father to understand, practice and ask questions about the labor and delivery.  Prepare the baby's room/nursery.  Do not travel out of the city unless   it is absolutely necessary and with the advice of your caregiver.  Wear only low or no heal shoes to have better balance and prevent falling. MEDICATIONS AND DRUG USE IN PREGNANCY  Take prenatal vitamins as directed. The vitamin should contain 1 milligram of folic acid. Keep all vitamins out of reach of children. Only a couple vitamins or tablets containing iron may be fatal to a baby or young child when ingested.  Avoid use of all medications, including herbs, over-the-counter medications, not prescribed or suggested by your caregiver. Only take over-the-counter or prescription medicines for pain, discomfort, or fever as directed by your caregiver. Do not use aspirin, ibuprofen (Motrin, Advil, Nuprin) or naproxen (Aleve) unless OK'd by your caregiver.  Let your caregiver also know about herbs you may be using.  Alcohol is related to a number of birth defects. This includes fetal alcohol syndrome. All alcohol, in any form, should be avoided completely. Smoking will cause low birth rate and premature babies.  Street/illegal drugs are very harmful to the baby. They are absolutely forbidden. A baby born to an addicted mother will be addicted at birth. The baby will go through the same withdrawal an adult does. SEEK MEDICAL CARE IF: You  have any concerns or worries during your pregnancy. It is better to call with your questions if you feel they cannot wait, rather than worry about them. DECISIONS ABOUT CIRCUMCISION You may or may not know the sex of your baby. If you know your baby is a boy, it may be time to think about circumcision. Circumcision is the removal of the foreskin of the penis. This is the skin that covers the sensitive end of the penis. There is no proven medical need for this. Often this decision is made on what is popular at the time or based upon religious beliefs and social issues. You can discuss these issues with your caregiver or pediatrician. SEEK IMMEDIATE MEDICAL CARE IF:   An unexplained oral temperature above 102 F (38.9 C) develops, or as your caregiver suggests.  You have leaking of fluid from the vagina (birth canal). If leaking membranes are suspected, take your temperature and tell your caregiver of this when you call.  There is vaginal spotting, bleeding or passing clots. Tell your caregiver of the amount and how many pads are used.  You develop a bad smelling vaginal discharge with a change in the color from clear to white.  You develop vomiting that lasts more than 24 hours.  You develop chills or fever.  You develop shortness of breath.  You develop burning on urination.  You loose more than 2 pounds of weight or gain more than 2 pounds of weight or as suggested by your caregiver.  You notice sudden swelling of your face, hands, and feet or legs.  You develop belly (abdominal) pain. Round ligament discomfort is a common non-cancerous (benign) cause of abdominal pain in pregnancy. Your caregiver still must evaluate you.  You develop a severe headache that does not go away.  You develop visual problems, blurred or double vision.  If you have not felt your baby move for more than 1 hour. If you think the baby is not moving as much as usual, eat something with sugar in it and lie down  on your left side for an hour. The baby should move at least 4 to 5 times per hour. Call right away if your baby moves less than that.  You fall, are in a car accident or   any kind of trauma.  There is mental or physical violence at home. Document Released: 12/14/2000 Document Revised: 03/14/2011 Document Reviewed: 06/18/2008 Exit[employer] Patient Information 2013 [employer].   

## 2011-11-10 ENCOUNTER — Ambulatory Visit (INDEPENDENT_AMBULATORY_CARE_PROVIDER_SITE_OTHER): Payer: Self-pay | Admitting: Advanced Practice Midwife

## 2011-11-10 VITALS — BP 117/69 | Temp 97.4°F | Wt 228.2 lb

## 2011-11-10 DIAGNOSIS — O99019 Anemia complicating pregnancy, unspecified trimester: Secondary | ICD-10-CM

## 2011-11-10 DIAGNOSIS — B977 Papillomavirus as the cause of diseases classified elsewhere: Secondary | ICD-10-CM

## 2011-11-10 DIAGNOSIS — O0990 Supervision of high risk pregnancy, unspecified, unspecified trimester: Secondary | ICD-10-CM

## 2011-11-10 LAB — POCT URINALYSIS DIP (DEVICE)
Bilirubin Urine: NEGATIVE
Glucose, UA: NEGATIVE mg/dL
Nitrite: NEGATIVE
pH: 7 (ref 5.0–8.0)

## 2011-11-10 NOTE — Progress Notes (Signed)
P = 84 Pain/pressure in right side of abdomen

## 2011-11-11 ENCOUNTER — Telehealth: Payer: Self-pay | Admitting: Obstetrics and Gynecology

## 2011-11-11 NOTE — Telephone Encounter (Signed)
Pathologist- Jessy Oto called stating that the specimen GC/Chlamydia had the wrong swab so cannot do the test ordered. Noted on appt. That patient will have to do this test again on next visit 11/17/11. Chandra Batch phone number is [phone].

## 2011-11-12 NOTE — Progress Notes (Signed)
GBS collected. (Not collected 11/03/11) Unsure presentation per Leopold's. Vtx by informal [place]. Possible poor wt gain. Unsure prepreg weight, but on ly 6 pounds in 10 weeks. F/U US at 32 weeks 73%. Good appetite. Will not repeat growth [place] at this time, but will watch weight gain and fundal height. Will consult w/ MD regarding additional testing R/T  Disease.

## 2011-11-14 LAB — CULTURE, BETA STREP (GROUP B ONLY)

## 2011-11-17 ENCOUNTER — Inpatient Hospital Stay (HOSPITAL_COMMUNITY)
Admission: AD | Admit: 2011-11-17 | Discharge: 2011-11-17 | Disposition: A | Discharge status: Home / Self Care | Payer: Self-pay | Source: Ambulatory Visit | Attending: Family Medicine | Admitting: Family Medicine

## 2011-11-17 ENCOUNTER — Ambulatory Visit (INDEPENDENT_AMBULATORY_CARE_PROVIDER_SITE_OTHER): Payer: Self-pay | Admitting: Obstetrics & Gynecology

## 2011-11-17 ENCOUNTER — Encounter (HOSPITAL_COMMUNITY): Payer: Self-pay

## 2011-11-17 ENCOUNTER — Encounter: Payer: Self-pay | Admitting: Advanced Practice Midwife

## 2011-11-17 VITALS — BP 106/63 | Temp 96.8°F | Wt 230.1 lb

## 2011-11-17 DIAGNOSIS — B977 Papillomavirus as the cause of diseases classified elsewhere: Secondary | ICD-10-CM

## 2011-11-17 DIAGNOSIS — O368190 Decreased fetal movements, unspecified trimester, not applicable or unspecified: Secondary | ICD-10-CM | POA: Insufficient documentation

## 2011-11-17 DIAGNOSIS — O09899 Supervision of other high risk pregnancies, unspecified trimester: Secondary | ICD-10-CM

## 2011-11-17 DIAGNOSIS — O99019 Anemia complicating pregnancy, unspecified trimester: Secondary | ICD-10-CM

## 2011-11-17 LAB — POCT URINALYSIS DIP (DEVICE)
Bilirubin Urine: NEGATIVE
Ketones, ur: NEGATIVE mg/dL
Protein, ur: NEGATIVE mg/dL

## 2011-11-17 NOTE — Patient Instructions (Signed)
Normal Labor and Delivery  Your caregiver must first be sure you are in labor. Signs of labor include:  · You may pass what is called "the mucus plug" before labor begins. This is a small amount of blood stained mucus.  · Regular uterine contractions.  · The time between contractions get closer together.  · The discomfort and pain gradually gets more intense.  · Pains are mostly located in the back.  · Pains get worse when walking.  · The cervix (the opening of the uterus becomes thinner (begins to efface) and opens up (dilates).  Once you are in labor and admitted into the hospital or care center, your caregiver will do the following:  · A complete physical examination.  · Check your vital signs (blood pressure, pulse, temperature and the fetal heart rate).  · Do a vaginal examination (using a sterile glove and lubricant) to determine:  · The position (presentation) of the baby (head [vertex] or buttock first).  · The level (station) of the baby's head in the birth canal.  · The effacement and dilatation of the cervix.  · You may have your pubic hair shaved and be given an enema depending on your caregiver and the circumstance.  · An electronic monitor is usually placed on your abdomen. The monitor follows the length and intensity of the contractions, as well as the baby's heart rate.  · Usually, your caregiver will insert an IV in your arm with a bottle of sugar water. This is done as a precaution so that medications can be given to you quickly during labor or delivery.  NORMAL LABOR AND DELIVERY IS DIVIDED UP INTO 3 STAGES:  First Stage  This is when regular contractions begin and the cervix begins to efface and dilate. This stage can last from 3 to 15 hours. The end of the first stage is when the cervix is 100% effaced and 10 centimeters dilated. Pain medications may be given by   · Injection (morphine, demerol, etc.)  · Regional anesthesia (spinal, caudal or epidural, anesthetics given in different locations of  the spine). Paracervical pain medication may be given, which is an injection of and anesthetic on each side of the cervix.  A pregnant woman may request to have "Natural Childbirth" which is not to have any medications or anesthesia during her labor and delivery.  Second Stage  This is when the baby comes down through the birth canal (vagina) and is born. This can take 1 to 4 hours. As the baby's head comes down through the birth canal, you may feel like you are going to have a bowel movement. You will get the urge to bear down and push until the baby is delivered. As the baby's head is being delivered, the caregiver will decide if an episiotomy (a cut in the perineum and vagina area) is needed to prevent tearing of the tissue in this area. The episiotomy is sewn up after the delivery of the baby and placenta. Sometimes a mask with nitrous oxide is given for the mother to breath during the delivery of the baby to help if there is too much pain. The end of Stage 2 is when the baby is fully delivered. Then when the umbilical cord stops pulsating it is clamped and cut.  Third Stage  The third stage begins after the baby is completely delivered and ends after the placenta (afterbirth) is delivered. This usually takes 5 to 30 minutes. After the placenta is delivered, a medication   is given either by intravenous or injection to help contract the uterus and prevent bleeding. The third stage is not painful and pain medication is usually not necessary. If an episiotomy was done, it is repaired at this time.  After the delivery, the mother is watched and monitored closely for 1 to 2 hours to make sure there is no postpartum bleeding (hemorrhage). If there is a lot of bleeding, medication is given to contract the uterus and stop the bleeding.  Document Released: 09/29/2007 Document Revised: 03/14/2011 Document Reviewed: 09/29/2007  Exit[employer]® Patient Information ©2013 [employer].

## 2011-11-17 NOTE — MAU Provider Note (Signed)
  History   Arrow Emmerich is a 31 year old female G3P2001 at [redacted] weeks gestation by ultrasound.  She is presenting with complaints of decreased fetal movement today. She reports being seen in the [facility] today for a prenatal visit and complaints of decreased fetal movement.  She states she was "still worried about the baby  after appointment."  No vaginal bleeding or leaking of fluid.  CSN: [id number]  Arrival date and time: 11/17/11 1342   First Provider Initiated Contact with Patient 11/17/11 1504      Chief Complaint  Patient presents with  . Decreased Fetal Movement   HPI  OB History    Grav Para Term Preterm Abortions TAB SAB Ect Mult Living   3 2 2   0  0   1      Past Medical History  Diagnosis Date  . Anemia   . Sickle cell trait     Past Surgical History  Procedure Date  . No past surgeries     Family History  Problem Relation Age of Onset  . Anesthesia problems Neg Hx     History  Substance Use Topics  . Smoking status: Never Smoker   . Smokeless tobacco: Never Used  . Alcohol Use: No    Allergies: No Known Allergies  No prescriptions prior to admission    ROS Physical Exam   Blood pressure 129/72, pulse 81, temperature 98.7 F (37.1 C), temperature source Oral, resp. rate 18, last menstrual period 11/21/2010.  Physical Exam  MAU Course  Procedures NST  Assessment and Plan  IUP @ 37 weeks Decreased Fetal Movement Category I FHR tracing  Reassured of well-being of baby Kick Counts reviewed Labor precautions given Keep scheduled appointment in the [facility] next week.  Raelyn Mora, SNM 11/17/2011, 8:25 PM  Supervised by: Dorathy Kinsman, CNM

## 2011-11-17 NOTE — Progress Notes (Signed)
Pulse 76 Patient reports pelvic pain & pressure and occasional contractions

## 2011-11-17 NOTE — Progress Notes (Signed)
Occasional contractions and some back pain. Good fetal movement. Routine care. GC and CT

## 2011-11-17 NOTE — MAU Note (Signed)
Decreased fetal movement. Denies pain, bleeding or leaking. Was at appt this morning, told them not moving as much, was on monitor and was ok.

## 2011-11-17 NOTE — MAU Provider Note (Signed)
I was present for the exam and agree with above.  [place], CNM 11/17/2011 9:13 PM

## 2011-11-17 NOTE — MAU Note (Signed)
Patient is in with c/o no fetal movement since this morning. She states that she was seen at the [facility] today, she told them and she was told that everything is okay. She denies any pain, lof or vaginal bleeding.

## 2011-11-20 ENCOUNTER — Encounter: Payer: Self-pay | Admitting: Advanced Practice Midwife

## 2011-11-20 DIAGNOSIS — O99820 Streptococcus B carrier state complicating pregnancy: Secondary | ICD-10-CM | POA: Insufficient documentation

## 2011-11-24 ENCOUNTER — Ambulatory Visit (INDEPENDENT_AMBULATORY_CARE_PROVIDER_SITE_OTHER): Payer: Self-pay | Admitting: Advanced Practice Midwife

## 2011-11-24 VITALS — BP 123/70 | Temp 97.3°F | Wt 234.9 lb

## 2011-11-24 DIAGNOSIS — O09899 Supervision of other high risk pregnancies, unspecified trimester: Secondary | ICD-10-CM

## 2011-11-24 DIAGNOSIS — O99820 Streptococcus B carrier state complicating pregnancy: Secondary | ICD-10-CM

## 2011-11-24 DIAGNOSIS — Z22330 Carrier of Group B streptococcus: Secondary | ICD-10-CM

## 2011-11-24 LAB — POCT URINALYSIS DIP (DEVICE)
Glucose, UA: NEGATIVE mg/dL
Leukocytes, UA: NEGATIVE
Specific Gravity, Urine: 1.015 (ref 1.005–1.030)
Urobilinogen, UA: 1 mg/dL (ref 0.0–1.0)

## 2011-11-24 NOTE — Progress Notes (Signed)
GBS pos, ABX in labor.

## 2011-11-24 NOTE — Progress Notes (Signed)
Pulse- 83  Pressure/pain-vaginally, lower abd

## 2011-11-30 ENCOUNTER — Ambulatory Visit (INDEPENDENT_AMBULATORY_CARE_PROVIDER_SITE_OTHER): Payer: Self-pay | Admitting: Advanced Practice Midwife

## 2011-11-30 ENCOUNTER — Encounter: Payer: Self-pay | Admitting: Advanced Practice Midwife

## 2011-11-30 DIAGNOSIS — O09899 Supervision of other high risk pregnancies, unspecified trimester: Secondary | ICD-10-CM

## 2011-11-30 LAB — POCT URINALYSIS DIP (DEVICE)
Bilirubin Urine: NEGATIVE
Glucose, UA: NEGATIVE mg/dL
Hgb urine dipstick: NEGATIVE
Nitrite: NEGATIVE

## 2011-11-30 NOTE — Progress Notes (Signed)
Pulse 92 Vaginal d/c stated as thin white; no odor, no itch.

## 2011-11-30 NOTE — Progress Notes (Signed)
Tired, no other c/o. Rev'd precautions.

## 2011-11-30 NOTE — Patient Instructions (Signed)
Pregnancy - Third Trimester  The third trimester of pregnancy (the last 3 months) is a period of the most rapid growth for you and your baby. The baby approaches a length of 20 inches and a weight of 6 to 10 pounds. The baby is adding on fat and getting ready for life outside your body. While inside, babies have periods of sleeping and waking, suck their thumbs, and hiccups. You can often feel small contractions of the uterus. This is false labor. It is also called Braxton-Hicks contractions. This is like a practice for labor. The usual problems in this stage of pregnancy include more difficulty breathing, swelling of the hands and feet from water retention, and having to urinate more often because of the uterus and baby pressing on your bladder.   PRENATAL EXAMS  · Blood work may continue to be done during prenatal exams. These tests are done to check on your health and the probable health of your baby. Blood work is used to follow your blood levels (hemoglobin). Anemia (low hemoglobin) is common during pregnancy. Iron and vitamins are given to help prevent this. You may also continue to be checked for diabetes. Some of the past blood tests may be done again.  · The size of the uterus is measured during each visit. This makes sure your baby is growing properly according to your pregnancy dates.  · Your blood pressure is checked every prenatal visit. This is to make sure you are not getting toxemia.  · Your urine is checked every prenatal visit for infection, diabetes and protein.  · Your weight is checked at each visit. This is done to make sure gains are happening at the suggested rate and that you and your baby are growing normally.  · Sometimes, an ultrasound is performed to confirm the position and the proper growth and development of the baby. This is a test done that bounces harmless sound waves off the baby so your caregiver can more accurately determine due dates.  · Discuss the type of pain medication and  anesthesia you will have during your labor and delivery.  · Discuss the possibility and anesthesia if a Cesarean Section might be necessary.  · Inform your caregiver if there is any mental or physical violence at home.  Sometimes, a specialized non-stress test, contraction stress test and biophysical profile are done to make sure the baby is not having a problem. Checking the amniotic fluid surrounding the baby is called an amniocentesis. The amniotic fluid is removed by sticking a needle into the belly (abdomen). This is sometimes done near the end of pregnancy if an early delivery is required. In this case, it is done to help make sure the baby's lungs are mature enough for the baby to live outside of the womb. If the lungs are not mature and it is unsafe to deliver the baby, an injection of cortisone medication is given to the mother 1 to 2 days before the delivery. This helps the baby's lungs mature and makes it safer to deliver the baby.  CHANGES OCCURING IN THE THIRD TRIMESTER OF PREGNANCY  Your body goes through many changes during pregnancy. They vary from person to person. Talk to your caregiver about changes you notice and are concerned about.  · During the last trimester, you have probably had an increase in your appetite. It is normal to have cravings for certain foods. This varies from person to person and pregnancy to pregnancy.  · You may begin to   get stretch marks on your hips, abdomen, and breasts. These are normal changes in the body during pregnancy. There are no exercises or medications to take which prevent this change.  · Constipation may be treated with a stool softener or adding bulk to your diet. Drinking lots of fluids, fiber in vegetables, fruits, and whole grains are helpful.  · Exercising is also helpful. If you have been very active up until your pregnancy, most of these activities can be continued during your pregnancy. If you have been less active, it is helpful to start an exercise  program such as walking. Consult your caregiver before starting exercise programs.  · Avoid all smoking, alcohol, un-prescribed drugs, herbs and "street drugs" during your pregnancy. These chemicals affect the formation and growth of the baby. Avoid chemicals throughout the pregnancy to ensure the delivery of a healthy infant.  · Backache, varicose veins and hemorrhoids may develop or get worse.  · You will tire more easily in the third trimester, which is normal.  · The baby's movements may be stronger and more often.  · You may become short of breath easily.  · Your belly button may stick out.  · A yellow discharge may leak from your breasts called colostrum.  · You may have a bloody mucus discharge. This usually occurs a few days to a week before labor begins.  HOME CARE INSTRUCTIONS   · Keep your caregiver's appointments. Follow your caregiver's instructions regarding medication use, exercise, and diet.  · During pregnancy, you are providing food for you and your baby. Continue to eat regular, well-balanced meals. Choose foods such as meat, fish, milk and other low fat dairy products, vegetables, fruits, and whole-grain breads and cereals. Your caregiver will tell you of the ideal weight gain.  · A physical sexual relationship may be continued throughout pregnancy if there are no other problems such as early (premature) leaking of amniotic fluid from the membranes, vaginal bleeding, or belly (abdominal) pain.  · Exercise regularly if there are no restrictions. Check with your caregiver if you are unsure of the safety of your exercises. Greater weight gain will occur in the last 2 trimesters of pregnancy. Exercising helps:  · Control your weight.  · Get you in shape for labor and delivery.  · You lose weight after you deliver.  · Rest a lot with legs elevated, or as needed for leg cramps or low back pain.  · Wear a good support or jogging bra for breast tenderness during pregnancy. This may help if worn during  sleep. Pads or tissues may be used in the bra if you are leaking colostrum.  · Do not use hot tubs, steam rooms, or saunas.  · Wear your seat belt when driving. This protects you and your baby if you are in an accident.  · Avoid raw meat, cat litter boxes and soil used by cats. These carry germs that can cause birth defects in the baby.  · It is easier to loose urine during pregnancy. Tightening up and strengthening the pelvic muscles will help with this problem. You can practice stopping your urination while you are going to the bathroom. These are the same muscles you need to strengthen. It is also the muscles you would use if you were trying to stop from passing gas. You can practice tightening these muscles up 10 times a set and repeating this about 3 times per day. Once you know what muscles to tighten up, do not perform these   exercises during urination. It is more likely to cause an infection by backing up the urine.  · Ask for help if you have financial, counseling or nutritional needs during pregnancy. Your caregiver will be able to offer counseling for these needs as well as refer you for other special needs.  · Make a list of emergency phone numbers and have them available.  · Plan on getting help from family or friends when you go home from the hospital.  · Make a trial run to the hospital.  · Take prenatal classes with the father to understand, practice and ask questions about the labor and delivery.  · Prepare the baby's room/nursery.  · Do not travel out of the city unless it is absolutely necessary and with the advice of your caregiver.  · Wear only low or no heal shoes to have better balance and prevent falling.  MEDICATIONS AND DRUG USE IN PREGNANCY  · Take prenatal vitamins as directed. The vitamin should contain 1 milligram of folic acid. Keep all vitamins out of reach of children. Only a couple vitamins or tablets containing iron may be fatal to a baby or young child when ingested.  · Avoid use  of all medications, including herbs, over-the-counter medications, not prescribed or suggested by your caregiver. Only take over-the-counter or prescription medicines for pain, discomfort, or fever as directed by your caregiver. Do not use aspirin, ibuprofen (Motrin®, Advil®, Nuprin®) or naproxen (Aleve®) unless OK'd by your caregiver.  · Let your caregiver also know about herbs you may be using.  · Alcohol is related to a number of birth defects. This includes fetal alcohol syndrome. All alcohol, in any form, should be avoided completely. Smoking will cause low birth rate and premature babies.  · Street/illegal drugs are very harmful to the baby. They are absolutely forbidden. A baby born to an addicted mother will be addicted at birth. The baby will go through the same withdrawal an adult does.  SEEK MEDICAL CARE IF:  You have any concerns or worries during your pregnancy. It is better to call with your questions if you feel they cannot wait, rather than worry about them.  DECISIONS ABOUT CIRCUMCISION  You may or may not know the sex of your baby. If you know your baby is a boy, it may be time to think about circumcision. Circumcision is the removal of the foreskin of the penis. This is the skin that covers the sensitive end of the penis. There is no proven medical need for this. Often this decision is made on what is popular at the time or based upon religious beliefs and social issues. You can discuss these issues with your caregiver or pediatrician.  SEEK IMMEDIATE MEDICAL CARE IF:   · An unexplained oral temperature above 102° F (38.9° C) develops, or as your caregiver suggests.  · You have leaking of fluid from the vagina (birth canal). If leaking membranes are suspected, take your temperature and tell your caregiver of this when you call.  · There is vaginal spotting, bleeding or passing clots. Tell your caregiver of the amount and how many pads are used.  · You develop a bad smelling vaginal discharge with  a change in the color from clear to white.  · You develop vomiting that lasts more than 24 hours.  · You develop chills or fever.  · You develop shortness of breath.  · You develop burning on urination.  · You loose more than 2 pounds of weight   or gain more than 2 pounds of weight or as suggested by your caregiver.  · You notice sudden swelling of your face, hands, and feet or legs.  · You develop belly (abdominal) pain. Round ligament discomfort is a common non-cancerous (benign) cause of abdominal pain in pregnancy. Your caregiver still must evaluate you.  · You develop a severe headache that does not go away.  · You develop visual problems, blurred or double vision.  · If you have not felt your baby move for more than 1 hour. If you think the baby is not moving as much as usual, eat something with sugar in it and lie down on your left side for an hour. The baby should move at least 4 to 5 times per hour. Call right away if your baby moves less than that.  · You fall, are in a car accident or any kind of trauma.  · There is mental or physical violence at home.  Document Released: 12/14/2000 Document Revised: 03/14/2011 Document Reviewed: 06/18/2008  [employer]® Patient Information ©2013 [employer].

## 2011-12-07 ENCOUNTER — Ambulatory Visit (INDEPENDENT_AMBULATORY_CARE_PROVIDER_SITE_OTHER): Payer: Self-pay | Admitting: Advanced Practice Midwife

## 2011-12-07 ENCOUNTER — Encounter (HOSPITAL_COMMUNITY): Payer: Self-pay | Admitting: *Deleted

## 2011-12-07 ENCOUNTER — Inpatient Hospital Stay (HOSPITAL_COMMUNITY)
Admission: AD | Admit: 2011-12-07 | Discharge: 2011-12-07 | Disposition: A | Discharge status: Home / Self Care | Payer: Self-pay | Source: Ambulatory Visit | Attending: Obstetrics & Gynecology | Admitting: Obstetrics & Gynecology

## 2011-12-07 ENCOUNTER — Encounter: Payer: Self-pay | Admitting: Advanced Practice Midwife

## 2011-12-07 VITALS — BP 120/72 | Temp 97.1°F | Wt 234.1 lb

## 2011-12-07 DIAGNOSIS — O0930 Supervision of pregnancy with insufficient antenatal care, unspecified trimester: Secondary | ICD-10-CM

## 2011-12-07 DIAGNOSIS — O99019 Anemia complicating pregnancy, unspecified trimester: Secondary | ICD-10-CM

## 2011-12-07 DIAGNOSIS — O479 False labor, unspecified: Secondary | ICD-10-CM | POA: Insufficient documentation

## 2011-12-07 DIAGNOSIS — O09299 Supervision of pregnancy with other poor reproductive or obstetric history, unspecified trimester: Secondary | ICD-10-CM

## 2011-12-07 DIAGNOSIS — D5720 Sickle-cell/Hb-C disease without crisis: Secondary | ICD-10-CM

## 2011-12-07 LAB — POCT URINALYSIS DIP (DEVICE)
Bilirubin Urine: NEGATIVE
Glucose, UA: NEGATIVE mg/dL
Ketones, ur: NEGATIVE mg/dL
Leukocytes, UA: NEGATIVE
pH: 6.5 (ref 5.0–8.0)

## 2011-12-07 MED ORDER — OXYCODONE-ACETAMINOPHEN 5-325 MG PO TABS: 1.0000 | ORAL_TABLET | ORAL | Status: DC | PRN

## 2011-12-07 NOTE — MAU Note (Signed)
Pt wishes to go home and keep scheduled appt today.

## 2011-12-07 NOTE — Progress Notes (Signed)
P=85, states came to MAU earlier this morning because of contractions.

## 2011-12-07 NOTE — Patient Instructions (Signed)
Normal Labor and Delivery  Your caregiver must first be sure you are in labor. Signs of labor include:  · You may pass what is called "the mucus plug" before labor begins. This is a small amount of blood stained mucus.  · Regular uterine contractions.  · The time between contractions get closer together.  · The discomfort and pain gradually gets more intense.  · Pains are mostly located in the back.  · Pains get worse when walking.  · The cervix (the opening of the uterus becomes thinner (begins to efface) and opens up (dilates).  Once you are in labor and admitted into the hospital or care center, your caregiver will do the following:  · A complete physical examination.  · Check your vital signs (blood pressure, pulse, temperature and the fetal heart rate).  · Do a vaginal examination (using a sterile glove and lubricant) to determine:  · The position (presentation) of the baby (head [vertex] or buttock first).  · The level (station) of the baby's head in the birth canal.  · The effacement and dilatation of the cervix.  · You may have your pubic hair shaved and be given an enema depending on your caregiver and the circumstance.  · An electronic monitor is usually placed on your abdomen. The monitor follows the length and intensity of the contractions, as well as the baby's heart rate.  · Usually, your caregiver will insert an IV in your arm with a bottle of sugar water. This is done as a precaution so that medications can be given to you quickly during labor or delivery.  NORMAL LABOR AND DELIVERY IS DIVIDED UP INTO 3 STAGES:  First Stage  This is when regular contractions begin and the cervix begins to efface and dilate. This stage can last from 3 to 15 hours. The end of the first stage is when the cervix is 100% effaced and 10 centimeters dilated. Pain medications may be given by   · Injection (morphine, demerol, etc.)  · Regional anesthesia (spinal, caudal or epidural, anesthetics given in different locations of  the spine). Paracervical pain medication may be given, which is an injection of and anesthetic on each side of the cervix.  A pregnant woman may request to have "Natural Childbirth" which is not to have any medications or anesthesia during her labor and delivery.  Second Stage  This is when the baby comes down through the birth canal (vagina) and is born. This can take 1 to 4 hours. As the baby's head comes down through the birth canal, you may feel like you are going to have a bowel movement. You will get the urge to bear down and push until the baby is delivered. As the baby's head is being delivered, the caregiver will decide if an episiotomy (a cut in the perineum and vagina area) is needed to prevent tearing of the tissue in this area. The episiotomy is sewn up after the delivery of the baby and placenta. Sometimes a mask with nitrous oxide is given for the mother to breath during the delivery of the baby to help if there is too much pain. The end of Stage 2 is when the baby is fully delivered. Then when the umbilical cord stops pulsating it is clamped and cut.  Third Stage  The third stage begins after the baby is completely delivered and ends after the placenta (afterbirth) is delivered. This usually takes 5 to 30 minutes. After the placenta is delivered, a medication   is given either by intravenous or injection to help contract the uterus and prevent bleeding. The third stage is not painful and pain medication is usually not necessary. If an episiotomy was done, it is repaired at this time.  After the delivery, the mother is watched and monitored closely for 1 to 2 hours to make sure there is no postpartum bleeding (hemorrhage). If there is a lot of bleeding, medication is given to contract the uterus and stop the bleeding.  Document Released: 09/29/2007 Document Revised: 03/14/2011 Document Reviewed: 09/29/2007  Exit[employer]® Patient Information ©2013 [employer].

## 2011-12-07 NOTE — Progress Notes (Signed)
Seen this morning. States hasnt slept due to pain. Cervix a little better now, but not in labor yet. Will give a few Percocet so she can get some therapeutic rest today. Membranes swept. Expect increased labor.soon. Will be 40 wk tomorrow. If not delivered by next week will schedule IOL for 41+.

## 2011-12-07 NOTE — MAU Note (Signed)
Pt G3 P2 at 39.6wks in for labor evaluation, contracting since 0300.

## 2011-12-07 NOTE — MAU Provider Note (Signed)
  History     CSN: [id number]  Arrival date and time: 12/07/11 0456   None     Chief Complaint  Patient presents with  . Labor Eval   HPI Mrs. Loudenslager is a 32 year old female G3P2001 at [redacted]w[redacted]d weeks gestation by ultrasound. She is presenting with contractions. Novaginal discharge, no water broken. Good fetal movements.   Past Medical History  Diagnosis Date  . Anemia   . Sickle cell trait     Past Surgical History  Procedure Date  . No past surgeries     Family History  Problem Relation Age of Onset  . Anesthesia problems Neg Hx     History  Substance Use Topics  . Smoking status: Never Smoker   . Smokeless tobacco: Never Used  . Alcohol Use: No    Allergies: No Known Allergies  Prescriptions prior to admission  Medication Sig Dispense Refill  . ferrous sulfate 325 (65 FE) MG tablet Take 1 tablet (325 mg total) by mouth 3 (three) times daily with meals.  90 tablet  11  . Prenatal Vit-Fe Fumarate-FA (PRENATAL MULTIVITAMIN) TABS Take 1 tablet by mouth daily.        Review of Systems  Constitutional: Negative for fever.  Eyes: Negative for blurred vision.  Cardiovascular: Negative for chest pain.  Gastrointestinal: Negative for nausea, vomiting and diarrhea.  Neurological: Negative for headaches.   Physical Exam   Blood pressure 126/78, pulse 81, temperature 98.5 F (36.9 C), temperature source Oral, resp. rate 20, height 5\' 8"  (1.727 m), weight 107.23 kg (236 lb 6.4 oz), last menstrual period 11/21/2010.  Physical Exam  Constitutional: She appears well-developed.  HENT:  Head: Normocephalic.  Neck: Normal range of motion. Neck supple.  Cardiovascular: Normal rate, regular rhythm and normal heart sounds.   Respiratory: Effort normal and breath sounds normal.  FHT: baseline 140s, moderate variability, accels present, no decels. Category I Contractions: mild , Q67min Cervical: 1/thick  MAU Course  Procedures  MDM Discharge  Assessment and Plan   Mrs. Diggins is a 31 year old female G3P2001 at [redacted]w[redacted]d weeks gestation by ultrasound who presented with contractions. FHT : Category I Contractions mild, irregular Patient is stable to be discharge  Governor Specking 12/07/2011, 5:34 AM   I  agree with the above. Cam Hai 12:24 AM 12/10/2011

## 2011-12-07 NOTE — MAU Note (Signed)
Pincus Badder CNM notified of pt SVE.  Pt may D/C home or walk x 1 hr.

## 2011-12-08 ENCOUNTER — Encounter (HOSPITAL_COMMUNITY): Payer: Self-pay | Admitting: Anesthesiology

## 2011-12-08 ENCOUNTER — Inpatient Hospital Stay (HOSPITAL_COMMUNITY): Payer: Medicaid Other | Admitting: Anesthesiology

## 2011-12-08 ENCOUNTER — Inpatient Hospital Stay (HOSPITAL_COMMUNITY)
Admission: AD | Admit: 2011-12-08 | Discharge: 2011-12-10 | DRG: 775 | Disposition: A | Discharge status: Home / Self Care | Payer: Medicaid Other | Source: Ambulatory Visit | Attending: Obstetrics and Gynecology | Admitting: Obstetrics and Gynecology

## 2011-12-08 ENCOUNTER — Encounter (HOSPITAL_COMMUNITY): Payer: Self-pay | Admitting: *Deleted

## 2011-12-08 DIAGNOSIS — IMO0001 Reserved for inherently not codable concepts without codable children: Secondary | ICD-10-CM

## 2011-12-08 DIAGNOSIS — O99820 Streptococcus B carrier state complicating pregnancy: Secondary | ICD-10-CM

## 2011-12-08 DIAGNOSIS — O99019 Anemia complicating pregnancy, unspecified trimester: Secondary | ICD-10-CM

## 2011-12-08 DIAGNOSIS — O9989 Other specified diseases and conditions complicating pregnancy, childbirth and the puerperium: Secondary | ICD-10-CM

## 2011-12-08 DIAGNOSIS — O0990 Supervision of high risk pregnancy, unspecified, unspecified trimester: Secondary | ICD-10-CM

## 2011-12-08 DIAGNOSIS — O99892 Other specified diseases and conditions complicating childbirth: Secondary | ICD-10-CM | POA: Diagnosis present

## 2011-12-08 DIAGNOSIS — Z22330 Carrier of Group B streptococcus: Secondary | ICD-10-CM

## 2011-12-08 LAB — RPR: RPR Ser Ql: NONREACTIVE

## 2011-12-08 LAB — TYPE AND SCREEN
ABO/RH(D): O POS
Antibody Screen: NEGATIVE

## 2011-12-08 LAB — CBC
MCH: 29.4 pg (ref 26.0–34.0)
MCHC: 35.2 g/dL (ref 30.0–36.0)
MCV: 83.4 fL (ref 78.0–100.0)
Platelets: 180 10*3/uL (ref 150–400)
RDW: 18.4 % — ABNORMAL HIGH (ref 11.5–15.5)

## 2011-12-08 LAB — ABO/RH: ABO/RH(D): O POS

## 2011-12-08 MED ORDER — IBUPROFEN 600 MG PO TABS
600.0000 mg | ORAL_TABLET | Freq: Four times a day (QID) | ORAL | Status: DC | PRN
  Administered 2011-12-08: 600 mg via ORAL
  Filled 2011-12-08: qty 1

## 2011-12-08 MED ORDER — OXYTOCIN BOLUS FROM INFUSION: 500.0000 mL | INTRAVENOUS | Status: DC

## 2011-12-08 MED ORDER — OXYCODONE-ACETAMINOPHEN 5-325 MG PO TABS
1.0000 | ORAL_TABLET | ORAL | Status: DC | PRN
  Administered 2011-12-08 (×2): 1 via ORAL
  Filled 2011-12-08 (×2): qty 1

## 2011-12-08 MED ORDER — PHENYLEPHRINE 40 MCG/ML (10ML) SYRINGE FOR IV PUSH (FOR BLOOD PRESSURE SUPPORT): 80.0000 ug | PREFILLED_SYRINGE | INTRAVENOUS | Status: DC | PRN

## 2011-12-08 MED ORDER — OXYTOCIN 40 UNITS IN LACTATED RINGERS INFUSION - SIMPLE MED
62.5000 mL/h | INTRAVENOUS | Status: DC
  Administered 2011-12-08: 500 mL/h via INTRAVENOUS
  Administered 2011-12-08: 62.5 mL/h via INTRAVENOUS
  Filled 2011-12-08: qty 1000

## 2011-12-08 MED ORDER — ONDANSETRON HCL 4 MG/2ML IJ SOLN: 4.0000 mg | INTRAMUSCULAR | Status: DC | PRN

## 2011-12-08 MED ORDER — ACETAMINOPHEN 325 MG PO TABS: 650.0000 mg | ORAL_TABLET | ORAL | Status: DC | PRN

## 2011-12-08 MED ORDER — LACTATED RINGERS IV SOLN
500.0000 mL | INTRAVENOUS | Status: DC | PRN
  Administered 2011-12-08: 200 mL via INTRAVENOUS
  Administered 2011-12-08: 500 mL via INTRAVENOUS

## 2011-12-08 MED ORDER — ONDANSETRON HCL 4 MG PO TABS: 4.0000 mg | ORAL_TABLET | ORAL | Status: DC | PRN

## 2011-12-08 MED ORDER — SENNOSIDES-DOCUSATE SODIUM 8.6-50 MG PO TABS
2.0000 | ORAL_TABLET | Freq: Every day | ORAL | Status: DC
  Administered 2011-12-08 – 2011-12-09 (×2): 2 via ORAL

## 2011-12-08 MED ORDER — FENTANYL 2.5 MCG/ML BUPIVACAINE 1/10 % EPIDURAL INFUSION (WH - ANES)
14.0000 mL/h | INTRAMUSCULAR | Status: DC
  Filled 2011-12-08: qty 125

## 2011-12-08 MED ORDER — LACTATED RINGERS IV SOLN: 500.0000 mL | Freq: Once | INTRAVENOUS | Status: DC

## 2011-12-08 MED ORDER — ZOLPIDEM TARTRATE 5 MG PO TABS: 5.0000 mg | ORAL_TABLET | Freq: Every evening | ORAL | Status: DC | PRN

## 2011-12-08 MED ORDER — PRENATAL MULTIVITAMIN CH
1.0000 | ORAL_TABLET | Freq: Every day | ORAL | Status: DC
  Administered 2011-12-08 – 2011-12-10 (×3): 1 via ORAL
  Filled 2011-12-08 (×3): qty 1

## 2011-12-08 MED ORDER — LIDOCAINE HCL (PF) 1 % IJ SOLN
INTRAMUSCULAR | Status: DC | PRN
  Administered 2011-12-08 (×2): 9 mL

## 2011-12-08 MED ORDER — LANOLIN HYDROUS EX OINT: TOPICAL_OINTMENT | CUTANEOUS | Status: DC | PRN

## 2011-12-08 MED ORDER — DIPHENHYDRAMINE HCL 50 MG/ML IJ SOLN: 12.5000 mg | INTRAMUSCULAR | Status: DC | PRN

## 2011-12-08 MED ORDER — EPHEDRINE 5 MG/ML INJ
10.0000 mg | INTRAVENOUS | Status: DC | PRN
  Filled 2011-12-08: qty 4

## 2011-12-08 MED ORDER — FENTANYL 2.5 MCG/ML BUPIVACAINE 1/10 % EPIDURAL INFUSION (WH - ANES)
INTRAMUSCULAR | Status: DC | PRN
  Administered 2011-12-08: 14 mL/h via EPIDURAL

## 2011-12-08 MED ORDER — FLEET ENEMA 7-19 GM/118ML RE ENEM: 1.0000 | ENEMA | RECTAL | Status: DC | PRN

## 2011-12-08 MED ORDER — SODIUM CHLORIDE 0.9 % IV SOLN
2.0000 g | Freq: Once | INTRAVENOUS | Status: AC
  Administered 2011-12-08: 2 g via INTRAVENOUS
  Filled 2011-12-08: qty 2000

## 2011-12-08 MED ORDER — WITCH HAZEL-GLYCERIN EX PADS
1.0000 "application " | MEDICATED_PAD | CUTANEOUS | Status: DC | PRN
  Administered 2011-12-08: 1 via TOPICAL

## 2011-12-08 MED ORDER — OXYCODONE-ACETAMINOPHEN 5-325 MG PO TABS: 1.0000 | ORAL_TABLET | ORAL | Status: DC | PRN

## 2011-12-08 MED ORDER — SIMETHICONE 80 MG PO CHEW: 80.0000 mg | CHEWABLE_TABLET | ORAL | Status: DC | PRN

## 2011-12-08 MED ORDER — BENZOCAINE-MENTHOL 20-0.5 % EX AERO
1.0000 "application " | INHALATION_SPRAY | CUTANEOUS | Status: DC | PRN
  Administered 2011-12-08: 1 via TOPICAL
  Filled 2011-12-08: qty 56

## 2011-12-08 MED ORDER — IBUPROFEN 600 MG PO TABS
600.0000 mg | ORAL_TABLET | Freq: Four times a day (QID) | ORAL | Status: DC
  Administered 2011-12-08 – 2011-12-10 (×9): 600 mg via ORAL
  Filled 2011-12-08 (×8): qty 1

## 2011-12-08 MED ORDER — CITRIC ACID-SODIUM CITRATE 334-500 MG/5ML PO SOLN: 30.0000 mL | ORAL | Status: DC | PRN

## 2011-12-08 MED ORDER — DIPHENHYDRAMINE HCL 25 MG PO CAPS: 25.0000 mg | ORAL_CAPSULE | Freq: Four times a day (QID) | ORAL | Status: DC | PRN

## 2011-12-08 MED ORDER — LIDOCAINE HCL (PF) 1 % IJ SOLN
30.0000 mL | INTRAMUSCULAR | Status: DC | PRN
  Filled 2011-12-08: qty 30

## 2011-12-08 MED ORDER — EPHEDRINE 5 MG/ML INJ: 10.0000 mg | INTRAVENOUS | Status: DC | PRN

## 2011-12-08 MED ORDER — MISOPROSTOL 200 MCG PO TABS: 800.0000 ug | ORAL_TABLET | Freq: Once | ORAL | Status: DC

## 2011-12-08 MED ORDER — PHENYLEPHRINE 40 MCG/ML (10ML) SYRINGE FOR IV PUSH (FOR BLOOD PRESSURE SUPPORT)
80.0000 ug | PREFILLED_SYRINGE | INTRAVENOUS | Status: DC | PRN
  Filled 2011-12-08: qty 5

## 2011-12-08 MED ORDER — DIBUCAINE 1 % RE OINT
1.0000 "application " | TOPICAL_OINTMENT | RECTAL | Status: DC | PRN
  Administered 2011-12-08: 1 via RECTAL
  Filled 2011-12-08: qty 28

## 2011-12-08 MED ORDER — TETANUS-DIPHTH-ACELL PERTUSSIS 5-2.5-18.5 LF-MCG/0.5 IM SUSP: 0.5000 mL | Freq: Once | INTRAMUSCULAR | Status: DC

## 2011-12-08 MED ORDER — LACTATED RINGERS IV SOLN
INTRAVENOUS | Status: DC
  Administered 2011-12-08 (×2): via INTRAVENOUS

## 2011-12-08 MED ORDER — MISOPROSTOL 200 MCG PO TABS
ORAL_TABLET | ORAL | Status: AC
  Filled 2011-12-08: qty 4

## 2011-12-08 MED ORDER — ONDANSETRON HCL 4 MG/2ML IJ SOLN: 4.0000 mg | Freq: Four times a day (QID) | INTRAMUSCULAR | Status: DC | PRN

## 2011-12-08 NOTE — Progress Notes (Signed)
UR chart review completed.  

## 2011-12-08 NOTE — Anesthesia Preprocedure Evaluation (Signed)
Anesthesia Evaluation  Patient identified by MRN, date of birth, ID band Patient awake    Reviewed: Allergy & Precautions, H&P , NPO status , Patient's Chart, lab work & pertinent test results  Airway Mallampati: II TM Distance: >3 FB Neck ROM: full    Dental No notable dental hx.    Pulmonary neg pulmonary ROS,    Pulmonary exam normal       Cardiovascular negative cardio ROS      Neuro/Psych negative neurological ROS  negative psych ROS   GI/Hepatic negative GI ROS, Neg liver ROS,   Endo/Other  Morbid obesity  Renal/GU negative Renal ROS  negative genitourinary   Musculoskeletal   Abdominal (+) + obese,   Peds negative pediatric ROS (+)  Hematology negative hematology ROS (+)   Anesthesia Other Findings   Reproductive/Obstetrics (+) Pregnancy                           Anesthesia Physical Anesthesia Plan  ASA: III  Anesthesia Plan: Epidural   Post-op Pain Management:    Induction:   Airway Management Planned:   Additional Equipment:   Intra-op Plan:   Post-operative Plan:   Informed Consent: I have reviewed the patients History and Physical, chart, labs and discussed the procedure including the risks, benefits and alternatives for the proposed anesthesia with the patient or authorized representative who has indicated his/her understanding and acceptance.     Plan Discussed with:   Anesthesia Plan Comments:         Anesthesia Quick Evaluation

## 2011-12-08 NOTE — Anesthesia Procedure Notes (Signed)
Epidural Patient location during procedure: OB Start time: 12/08/2011 2:21 AM End time: 12/08/2011 2:25 AM  Staffing Anesthesiologist: Sandrea Hughs Performed by: anesthesiologist   Preanesthetic Checklist Completed: patient identified, site marked, surgical consent, pre-op evaluation, timeout performed, IV checked, risks and benefits discussed and monitors and equipment checked  Epidural Patient position: sitting Prep: site prepped and draped and DuraPrep Patient monitoring: continuous pulse ox and blood pressure Approach: midline Injection technique: LOR air  Needle:  Needle type: Tuohy  Needle gauge: 17 G Needle length: 9 cm and 9 Needle insertion depth: 5 cm cm Catheter type: closed end flexible Catheter size: 19 Gauge Catheter at skin depth: 10 cm Test dose: negative and Other  Assessment Sensory level: T8 Events: blood not aspirated, injection not painful, no injection resistance, negative IV test and no paresthesia  Additional Notes Reason for block:procedure for pain

## 2011-12-08 NOTE — H&P (Signed)
Taylor Roman is a 31 y.o. female presenting for labor check. Maternal Medical History:  Reason for admission: Reason for admission: rupture of membranes and contractions.  Reason for Admission:   nauseaContractions: Onset was yesterday.   Frequency: regular.   Duration is approximately 4 minutes.   Perceived severity is moderate.    Fetal activity: Perceived fetal activity is normal.   Last perceived fetal movement was within the past hour.    Prenatal complications: No bleeding.     OB History    Grav Para Term Preterm Abortions TAB SAB Ect Mult Living   3 2 2   0  0   1     Past Medical History  Diagnosis Date  . Anemia   . Sickle cell trait    Past Surgical History  Procedure Date  . No past surgeries    Family History: family history is negative for Anesthesia problems. Social History:  reports that she has never smoked. She has never used smokeless tobacco. She reports that she does not drink alcohol or use illicit drugs.   Prenatal Transfer Tool  Maternal Diabetes: No Genetic Screening: Normal Maternal Ultrasounds/Referrals: Normal Fetal Ultrasounds or other Referrals:  None Maternal Substance Abuse:  No Significant Maternal Medications:  None Significant Maternal Lab Results:  Lab values include: Group B Strep positive Other Comments:  None  Review of Systems  Constitutional: Negative for fever.  Eyes: Negative for blurred vision.  Gastrointestinal: Negative for nausea.  Neurological: Negative for headaches.      Blood pressure 147/83, pulse 80, resp. rate 18, last menstrual period 11/21/2010. Maternal Exam:  Uterine Assessment: Contraction strength is moderate.  Contraction duration is 4 minutes. Contraction frequency is regular.   Abdomen: Fetal presentation: vertex     Fetal Exam Fetal Monitor Review: Mode: hand-held doppler probe.   Baseline rate: 140.  Variability: moderate (6-25 bpm).   Pattern: accelerations present and no decelerations.     Fetal State Assessment: Category I - tracings are normal.     Physical Exam  Constitutional: She appears well-developed.  HENT:  Head: Normocephalic.  Cardiovascular: Normal rate and regular rhythm.   Respiratory: Effort normal.  Cervical: 7/80/-2 Prenatal labs: ABO, Rh: O/POS/-- (07/03 0943) Antibody: NEG (07/03 0943) Rubella: 76.2 (07/03 0943) RPR: NON REAC (09/05 1021)  HBsAg: NEGATIVE (07/03 0943)  HIV: NON REACTIVE (09/05 1021)  GBS: Positive (11/07 0000)   Assessment/Plan: Taylor Roman is a 31 year old female G3P2001 at [redacted]w[redacted]d weeks gestation by ultrasound who presented with contractions.   1. Labor: Active 2. Fetal Wellbeing: Category I 3. Pain Control:None 4. GBS: Positive  Plan:  1. Admit to BS 2. Routine L&D orders 3. Analgesia/anesthesia PRN       Governor Specking 12/08/2011, 12:58 AM

## 2011-12-08 NOTE — MAU Note (Signed)
Pt states contractions started around 9pm

## 2011-12-08 NOTE — Progress Notes (Signed)
OB provider at bedside to assess patient's bleeding.  Fundus firm and no lacerations seen.

## 2011-12-09 LAB — CBC
HCT: 21.3 % — ABNORMAL LOW (ref 36.0–46.0)
Hemoglobin: 7.4 g/dL — ABNORMAL LOW (ref 12.0–15.0)
MCH: 29 pg (ref 26.0–34.0)
MCV: 83.5 fL (ref 78.0–100.0)
RBC: 2.55 MIL/uL — ABNORMAL LOW (ref 3.87–5.11)

## 2011-12-09 MED ORDER — IBUPROFEN 600 MG PO TABS: 600.0000 mg | ORAL_TABLET | Freq: Four times a day (QID) | ORAL | Status: DC

## 2011-12-09 NOTE — Anesthesia Postprocedure Evaluation (Signed)
Anesthesia Post Note  Patient: Taylor Roman  Procedure(s) Performed: * No procedures listed *  Anesthesia type: Epidural  Patient location: Mother/Baby  Post pain: Pain level controlled  Post assessment: Post-op Vital signs reviewed  Last Vitals:  Filed Vitals:   12/09/11 0640  BP: 121/65  Pulse: 74  Temp: 36.7 C  Resp: 18    Post vital signs: Reviewed  Level of consciousness: awake  Complications: No apparent anesthesia complications

## 2011-12-09 NOTE — Progress Notes (Signed)
Will cancel discharge. Baby has to stay.  Wynelle Bourgeois CNM

## 2011-12-09 NOTE — Discharge Summary (Signed)
Obstetric Discharge Summary Reason for Admission: onset of labor and rupture of membranes Prenatal Procedures: none Intrapartum Procedures: spontaneous vaginal delivery Postpartum Procedures: none Complications-Operative and Postpartum: tight nuchal cord X1 Hemoglobin  Date Value Range Status  12/09/2011 7.4* 12.0 - 15.0 g/dL Final     REPEATED TO VERIFY     DELTA CHECK NOTED     HCT  Date Value Range Status  12/09/2011 21.3* 36.0 - 46.0 % Final    Physical Exam:  General: alert, cooperative and no distress Lochia: appropriate Uterine Fundus: firm DVT Evaluation: No evidence of DVT seen on physical exam.  Discharge Diagnoses: Term Pregnancy-delivered  Discharge Information: Date: 12/09/2011 Activity: pelvic rest Diet: routine Medications: PNV, Ibuprofen and Colace Condition: stable Instructions: refer to practice specific booklet Discharge to: home Follow-up Information    Schedule an appointment as soon as possible for a visit with [facility]. (4-6 weeks for routine postpartum care)    Contact information:   [address] [place] [address] [phone]         Newborn Data: Live born female  Birth Weight: 8 lb 7.1 oz (3830 g) APGAR: 6, 9  Home with mother. Breast and bottle feeding. No circumcision planned. Depo for contraception.   Corky Downs 12/09/2011, 7:50 AM  I have seen the patient with the resident/student and agree with the above.

## 2011-12-10 NOTE — Discharge Summary (Signed)
Obstetric Discharge Summary Reason for Admission: onset of labor Prenatal Procedures: none Intrapartum Procedures: spontaneous vaginal delivery, GBS prophylaxis Postpartum Procedures: none Complications-Operative and Postpartum: none  Eating, drinking, voiding, ambulating well.  +flatus.  Lochia and pain wnl.  No complaints.   Hemoglobin  Date Value Range Status  12/09/2011 7.4* 12.0 - 15.0 g/dL Final     REPEATED TO VERIFY     DELTA CHECK NOTED     HCT  Date Value Range Status  12/09/2011 21.3* 36.0 - 46.0 % Final    Physical Exam:  General: alert, cooperative and no distress Lochia: appropriate Uterine Fundus: firm Incision: n/a DVT Evaluation: No evidence of DVT seen on physical exam. Negative Homan's sign. No cords or calf tenderness. No significant calf/ankle edema.  Discharge Diagnoses: Term Pregnancy-delivered  Discharge Information: Date: 12/10/2011 Activity: pelvic rest Diet: routine Medications: PNV and Ibuprofen Condition: stable Instructions: refer to practice specific booklet Discharge to: home Follow-up Information    Schedule an appointment as soon as possible for a visit with [facility]. (4-6 weeks for routine postpartum care)    Contact information:   [address] [place] [address] [phone]         Newborn Data: Live born female  Birth Weight: 8 lb 7.1 oz (3830 g) APGAR: 6, 9  Home with mother.  Breast and bottlefeeding, depo for contraception, does not want circumcision.  Marge Duncans 12/10/2011, 8:52 AM

## 2011-12-11 NOTE — MAU Provider Note (Signed)
Attestation of Attending Supervision of Advanced Practitioner (CNM/NP): Evaluation and management procedures were performed by the Advanced Practitioner under my supervision and collaboration.  I have reviewed the Advanced Practitioner's note and chart, and I agree with the management and plan.  HARRAWAY-SMITH, Sulema Braid 9:11 AM     

## 2011-12-14 ENCOUNTER — Encounter: Payer: Self-pay | Admitting: Obstetrics & Gynecology

## 2012-01-11 ENCOUNTER — Encounter: Payer: Self-pay | Admitting: Obstetrics and Gynecology

## 2012-01-11 ENCOUNTER — Ambulatory Visit (INDEPENDENT_AMBULATORY_CARE_PROVIDER_SITE_OTHER): Payer: Self-pay | Admitting: Obstetrics and Gynecology

## 2012-01-11 DIAGNOSIS — Z3042 Encounter for surveillance of injectable contraceptive: Secondary | ICD-10-CM

## 2012-01-11 DIAGNOSIS — Z3049 Encounter for surveillance of other contraceptives: Secondary | ICD-10-CM

## 2012-01-11 MED ORDER — MEDROXYPROGESTERONE ACETATE 150 MG/ML IM SUSP: 150.0000 mg | INTRAMUSCULAR | Status: DC

## 2012-01-11 MED ORDER — MEDROXYPROGESTERONE ACETATE 150 MG/ML IM SUSP
150.0000 mg | Freq: Once | INTRAMUSCULAR | Status: AC
  Administered 2012-01-11: 150 mg via INTRAMUSCULAR

## 2012-01-11 NOTE — Progress Notes (Signed)
  Subjective:     Taylor Roman is a 32 y.o. female who presents for a postpartum visit. She is 4 weeks postpartum following a spontaneous vaginal delivery. I have fully reviewed the prenatal and intrapartum course. The delivery was at 40 gestational weeks. Outcome: spontaneous vaginal delivery. Anesthesia: epidural. Postpartum course has been uncomplicated. Baby's course has been uncomplicated . Baby is feeding by breast. Bleeding no bleeding. Bowel function is normal. Bladder function is normal. Patient is not sexually active. Contraception method is abstinence. Postpartum depression screening: negative. Sleeping well. Has help with baby. Desires Depo which she has used in the past without problems.  The following portions of the patient's history were reviewed and updated as appropriate: allergies, current medications, past family history, past medical history, past social history, past surgical history and problem list.  Review of Systems Pertinent items are noted in HPI.   Objective:    BP 108/64  Pulse 60  Temp 98.1 F (36.7 C) (Oral)  Ht 5\' 6"  (1.676 m)  Wt 215 lb 1.6 oz (97.569 kg)  BMI 34.72 kg/m2  Breastfeeding? Yes  General:  alert, cooperative and no distress   Breasts:  inspection negative, no nipple discharge or bleeding, no masses or nodularity palpable  Lungs: clear to auscultation bilaterally  Heart:  regular rate and rhythm, S1, S2 normal, no murmur, click, rub or gallop  Abdomen: soft, non-tender; bowel sounds normal; no masses,  no organomegaly   Vulva:  normal  Vagina: normal vagina  Cervix:  retroverted  Corpus: normal size, contour, position, consistency, mobility, non-tender  Adnexa:  no mass, fullness, tenderness  Rectal Exam: Not performed.        Assessment:    4 wks postpartum exam. Pap smear not done at today's visit.   Plan:    1. Contraception: Depo-Provera injections Start today 2. Continue breastfeeding at least 1 yr 3. Follow up in: 3 months or  as needed.

## 2012-01-11 NOTE — Addendum Note (Signed)
Addended by: Gerome Apley on: 01/11/2012 02:26 PM   Modules accepted: Orders

## 2012-01-11 NOTE — Progress Notes (Signed)
Gave depoprovera per order Deidre Poe, CNM- states do not do upt because patient is her for postpartum and has not had intercourse. Verified with patient she has not had intercouse since delivery. Shot given

## 2012-01-11 NOTE — Patient Instructions (Signed)
Postpartum Care After Vaginal Delivery  After you deliver your baby, you will stay in the hospital for 24 to 72 hours, unless there were problems with the labor or delivery, or you have medical problems. While you are in the hospital, you will receive help and instructions on how to care for yourself and your baby.  Your doctor will order pain medicine, in case you need it. You will have a small amount of bleeding from your vagina and should change your sanitary pad frequently. Wash your hands thoroughly with soap and water for at least 20 seconds after changing pads and using the toilet. Let the nurses know if you begin to pass blood clots or your bleeding increases. Do not flush blood clots down the toilet before having the nurse look at them, to make sure there is no placental tissue with them.  If you had an intravenous (IV), it will be removed within 24 hours, if there are no problems. The first time you get out of bed or take a shower, call the nurse to help you because you may get weak, lightheaded, or even faint. If you are breastfeeding, you may feel painful contractions of your uterus for a couple of weeks. This is normal. The contractions help your uterus get back to normal size. If you are not breastfeeding, wear a supportive bra and handle your breasts as little as possible until your milk has dried up. Hormones should not be given to dry up the breasts, because they can cause blood clots. You will be given your normal diet, unless you have diabetes or other medical problems.   The nurses may put an ice pack on your episiotomy (surgically enlarged opening), if you have one, to reduce the pain and swelling. On rare occasions, you may not be able to urinate and the nurse will need to empty your bladder with a catheter. If you had a postpartum tubal ligation ("tying tubes," female sterilization), it should not make your stay in the hospital longer.  You may have your baby in your room with you as much as  you like, unless you or the baby has a problem. Use the bassinet (basket) for the baby when going to and from the nursery. Do not carry the baby. Do not leave the postpartum area. If the mother is Rh negative (lacks a protein on the red blood cells) and the baby is Rh positive, the mother should get a Rho-gam shot to prevent Rh problems with future pregnancies.  You may be given written instructions for you and your baby, and necessary medicines, when you are discharged from the hospital. Be sure you understand and follow the instructions as advised.  HOME CARE INSTRUCTIONS    Follow instructions and take the medicines given to you.   Only take over-the-counter or prescription medicines for pain, discomfort, or fever as directed by your caregiver.   Do not take aspirin, because it can cause bleeding.   Increase your activities a little bit every day to build up your strength and endurance.   Do not drink alcohol, especially if you are breastfeeding or taking pain medicine.   Take your temperature twice a day and record it.   You may have a small amount of bleeding or spotting for 2 to 4 weeks. This is normal.   Do not use tampons or douche. Use sanitary pads.   Try to have someone stay and help you for a few days when you go home.     Try to rest or take a nap when the baby is sleeping.   If you are breastfeeding, wear a good support bra. If you are not breastfeeding, wear a supportive bra and do not stimulate your nipples.   Eat a healthy, nutritious diet and continue to take your prenatal vitamins.   Do not drive, do any heavy activities, or travel until your caregiver tells you it is okay.   Do not have intercourse until your caregiver gives you permission to do so.   Ask your caregiver when you can begin to exercise and what type of exercises to do.   Call your caregiver if you think you are having a problem from your delivery.   Call your pediatrician if you are having a problem with the  baby.   Schedule your postpartum visit and keep it.  SEEK MEDICAL CARE IF:    You have a temperature of 100 F (37.8 C) or higher.   You have increased vaginal bleeding or are passing clots. Save any clots to show your caregiver.   You have bloody urine or pain when you urinate.   You have a bad smelling vaginal discharge.   You have increasing pain or swelling on your episiotomy.   You develop a severe headache.   You feel depressed.   The episiotomy is separating.   You become dizzy or lightheaded.   You develop a rash.   You have a reaction or problems with your medicine.   You have pain, redness, or swelling at the intravenous site.  SEEK IMMEDIATE MEDICAL CARE IF:    You have chest pain.   You develop shortness of breath.   You pass out.   You develop pain, with or without swelling or redness in your leg.   You develop heavy vaginal bleeding, with or without blood clots.   You develop stomach pain.   You develop a bad smelling vaginal discharge.  MAKE SURE YOU:    Understand these instructions.   Will watch your condition.   Will get help right away if you are not doing well or get worse.  Document Released: 10/17/2006 Document Revised: 03/14/2011 Document Reviewed: 10/29/2008  ExitCare Patient Information 2013 [employer].

## 2012-11-08 ENCOUNTER — Other Ambulatory Visit: Payer: Self-pay

## 2012-12-22 ENCOUNTER — Encounter (HOSPITAL_COMMUNITY): Payer: Self-pay | Admitting: Emergency Medicine

## 2012-12-22 ENCOUNTER — Emergency Department (HOSPITAL_COMMUNITY)
Admission: EM | Admit: 2012-12-22 | Discharge: 2012-12-22 | Disposition: A | Discharge status: Home / Self Care | Payer: PRIVATE HEALTH INSURANCE | Source: Home / Self Care | Attending: Family Medicine | Admitting: Family Medicine

## 2012-12-22 DIAGNOSIS — R002 Palpitations: Secondary | ICD-10-CM

## 2012-12-22 NOTE — ED Provider Notes (Signed)
Aleeya Veitch is a 32 y.o. female who presents to Urgent Care today for palpitations or shortness of breath for the last 6 months. Patient has intermittent episodes lasting one to 2 minutes of moderate shortness of breath. She notes that she feels a flutter in her chest wall this is ongoing. She denies any significant chest pain weakness or syncope. She has never passed out. She has not had this evaluated. This typically happens 1-2 times a month. It happened yesterday and she is here today for evaluation. Currently she is completely asymptomatic.   Past Medical History  Diagnosis Date  . Anemia   . Sickle cell trait    History  Substance Use Topics  . Smoking status: Never Smoker   . Smokeless tobacco: Never Used  . Alcohol Use: No   ROS as above Medications reviewed. No current facility-administered medications for this encounter.   Current Outpatient Prescriptions  Medication Sig Dispense Refill  . ferrous sulfate 325 (65 FE) MG tablet Take 1 tablet (325 mg total) by mouth 3 (three) times daily with meals.  90 tablet  11  . ibuprofen (ADVIL,MOTRIN) 600 MG tablet Take 1 tablet (600 mg total) by mouth every 6 (six) hours.  30 tablet  1  . medroxyPROGESTERone (DEPO-PROVERA) 150 MG/ML injection Inject 1 mL (150 mg total) into the muscle every 3 (three) months.  1 mL  0  . Prenatal Vit-Fe Fumarate-FA (PRENATAL MULTIVITAMIN) TABS Take 1 tablet by mouth daily.        Exam:  BP 123/74  Pulse 85  Temp(Src) 98.6 F (37 C) (Oral)  Resp 16  SpO2 100% Gen: Well NAD HEENT: EOMI,  MMM Lungs: Normal work of breathing. CTABL Heart: RRR no MRG Abd: NABS, Soft. NT, ND Exts: Non edematous BL  LE, warm and well perfused.    Twelve-lead EKG shows normal sinus rhythm at 60 beats per minute. QTC 412 otherwise normal.   Assessment and Plan: 32 y.o. female with probable palpitations. EKG normal today. Patient has the orange card. Followup with [facility]. Handout  provided. Discussed warning signs or symptoms. Please see discharge instructions. Patient expresses understanding.      Rodolph Bong, MD 12/22/12 [phone]

## 2012-12-22 NOTE — ED Notes (Signed)
reportedly SOB x ~ 6 months; delivery early 2014, and does not relate this to any SOB syx; no change in symptoms today; NAD

## 2013-10-28 ENCOUNTER — Inpatient Hospital Stay (HOSPITAL_COMMUNITY)
Admission: AD | Admit: 2013-10-28 | Discharge: 2013-10-28 | Disposition: A | Discharge status: Home / Self Care | Payer: Self-pay | Source: Ambulatory Visit | Attending: Obstetrics and Gynecology | Admitting: Obstetrics and Gynecology

## 2013-10-28 ENCOUNTER — Telehealth: Payer: Self-pay | Admitting: Obstetrics and Gynecology

## 2013-10-28 ENCOUNTER — Encounter (HOSPITAL_COMMUNITY): Payer: Self-pay

## 2013-10-28 DIAGNOSIS — R5381 Other malaise: Secondary | ICD-10-CM | POA: Insufficient documentation

## 2013-10-28 DIAGNOSIS — R6889 Other general symptoms and signs: Secondary | ICD-10-CM

## 2013-10-28 DIAGNOSIS — J029 Acute pharyngitis, unspecified: Secondary | ICD-10-CM | POA: Insufficient documentation

## 2013-10-28 DIAGNOSIS — R51 Headache: Secondary | ICD-10-CM | POA: Insufficient documentation

## 2013-10-28 LAB — INFLUENZA PANEL BY PCR (TYPE A & B)
H1N1 flu by pcr: NOT DETECTED
Influenza A By PCR: NEGATIVE
Influenza B By PCR: NEGATIVE

## 2013-10-28 LAB — URINALYSIS, ROUTINE W REFLEX MICROSCOPIC
Bilirubin Urine: NEGATIVE
Glucose, UA: NEGATIVE mg/dL
HGB URINE DIPSTICK: NEGATIVE
Ketones, ur: NEGATIVE mg/dL
LEUKOCYTES UA: NEGATIVE
NITRITE: NEGATIVE
PROTEIN: NEGATIVE mg/dL
SPECIFIC GRAVITY, URINE: 1.01 (ref 1.005–1.030)
UROBILINOGEN UA: 0.2 mg/dL (ref 0.0–1.0)
pH: 6 (ref 5.0–8.0)

## 2013-10-28 LAB — RAPID STREP SCREEN (MED CTR MEBANE ONLY): Streptococcus, Group A Screen (Direct): NEGATIVE

## 2013-10-28 LAB — POCT PREGNANCY, URINE: Preg Test, Ur: NEGATIVE

## 2013-10-28 NOTE — MAU Note (Signed)
Also has had congestion and sore throat.

## 2013-10-28 NOTE — Telephone Encounter (Signed)
Informed patient of influenza and strep results. encouraged rest and fluids.

## 2013-10-28 NOTE — MAU Provider Note (Signed)
Attestation of Attending Supervision of Advanced Practitioner (CNM/NP): Evaluation and management procedures were performed by the Advanced Practitioner under my supervision and collaboration.  I have reviewed the Advanced Practitioner's note and chart, and I agree with the management and plan.  Makenley Shimp 10/28/2013 8:25 PM

## 2013-10-28 NOTE — MAU Note (Signed)
Pt states here for n/v/d, body aches. Began feeling sick for past week then yesterday began feeling worse. Also has headache. Denies gynecologic issues.

## 2013-10-28 NOTE — MAU Provider Note (Signed)
History     CSN: [id number]  Arrival date and time: 10/28/13 [id number]   None     Chief Complaint  Patient presents with  . Emesis  . Diarrhea   HPI  Ms Earlie Ravelingmina Westerhoff is a 33 y.o. non-pregnant female [id number] who presents with various cold like symptoms. She feels like the symptoms have worsened over the past few days. Symptoms include headache, sore throat and overall malaise for 1 week.   OB History   Grav Para Term Preterm Abortions TAB SAB Ect Mult Living   3 3 3   0  0   2      Past Medical History  Diagnosis Date  . Anemia   . Sickle cell trait     Past Surgical History  Procedure Laterality Date  . No past surgeries      Family History  Problem Relation Age of Onset  . Anesthesia problems Neg Hx     History  Substance Use Topics  . Smoking status: Never Smoker   . Smokeless tobacco: Never Used  . Alcohol Use: No    Allergies: No Known Allergies  Prescriptions prior to admission  Medication Sig Dispense Refill  . ferrous sulfate 325 (65 FE) MG tablet Take 1 tablet (325 mg total) by mouth 3 (three) times daily with meals.  90 tablet  11  . ibuprofen (ADVIL,MOTRIN) 600 MG tablet Take 1 tablet (600 mg total) by mouth every 6 (six) hours.  30 tablet  1  . medroxyPROGESTERone (DEPO-PROVERA) 150 MG/ML injection Inject 1 mL (150 mg total) into the muscle every 3 (three) months.  1 mL  0  . Prenatal Vit-Fe Fumarate-FA (PRENATAL MULTIVITAMIN) TABS Take 1 tablet by mouth daily.       Results for orders placed during the hospital encounter of 10/28/13 (from the past 48 hour(s))  URINALYSIS, ROUTINE W REFLEX MICROSCOPIC     Status: None   Collection Time    10/28/13 10:10 AM      Result Value Ref Range   Color, Urine YELLOW  YELLOW   APPearance CLEAR  CLEAR   Specific Gravity, Urine 1.010  1.005 - 1.030   pH 6.0  5.0 - 8.0   Glucose, UA NEGATIVE  NEGATIVE mg/dL   Hgb urine dipstick NEGATIVE  NEGATIVE   Bilirubin Urine NEGATIVE  NEGATIVE   Ketones, ur NEGATIVE   NEGATIVE mg/dL   Protein, ur NEGATIVE  NEGATIVE mg/dL   Urobilinogen, UA 0.2  0.0 - 1.0 mg/dL   Nitrite NEGATIVE  NEGATIVE   Leukocytes, UA NEGATIVE  NEGATIVE   Comment: MICROSCOPIC NOT DONE ON URINES WITH NEGATIVE PROTEIN, BLOOD, LEUKOCYTES, NITRITE, OR GLUCOSE <1000 mg/dL.  POCT PREGNANCY, URINE     Status: None   Collection Time    10/28/13 10:18 AM      Result Value Ref Range   Preg Test, Ur NEGATIVE  NEGATIVE   Comment:            THE SENSITIVITY OF THIS     METHODOLOGY IS >24 mIU/mL  INFLUENZA PANEL BY PCR (TYPE A & B, H1N1)     Status: None   Collection Time    10/28/13 10:40 AM      Result Value Ref Range   Influenza A By PCR NEGATIVE  NEGATIVE   Influenza B By PCR NEGATIVE  NEGATIVE   H1N1 flu by pcr NOT DETECTED  NOT DETECTED   Comment:  The Xpert Flu assay (FDA approved for     nasal aspirates or washes and     nasopharyngeal swab specimens), is     intended as an aid in the diagnosis of     influenza and should not be used as     a sole basis for treatment.     Per[facility]  RAPID STREP SCREEN     Status: None   Collection Time    10/28/13 10:47 AM      Result Value Ref Range   Streptococcus, Group A Screen (Direct) NEGATIVE  NEGATIVE   Comment: (NOTE)     A Rapid Antigen test may result negative if the antigen level in the     sample is below the detection level of this test. The FDA has not     cleared this test as a stand-alone test therefore the rapid antigen     negative result has reflexed to a Group A Strep culture.     Performed at [facility]    Review of Systems  Constitutional: Positive for fever and malaise/fatigue.  HENT: Positive for sore throat. Negative for congestion.   Respiratory: Positive for sputum production.   Neurological: Positive for weakness and headaches.   Physical Exam   Blood pressure 123/78, pulse 71, temperature 98.7 F (37.1 C), temperature source Oral, resp. rate 16, height 5\' 7"   (1.702 m), weight 92.534 kg (204 lb), last menstrual period 09/27/2013, not currently breastfeeding.  Physical Exam  Nursing note and vitals reviewed. Constitutional: She is oriented to person, place, and time. Vital signs are normal. She appears well-developed and well-nourished.  Non-toxic appearance. She has a sickly appearance. No distress.  HENT:  Head: Normocephalic.  Mouth/Throat: Uvula is midline and mucous membranes are normal. No oropharyngeal exudate, posterior oropharyngeal edema, posterior oropharyngeal erythema or tonsillar abscesses.  Eyes: Pupils are equal, round, and reactive to light.  Neck: Neck supple.  Cardiovascular: Normal rate and normal heart sounds.   Respiratory: Effort normal and breath sounds normal. No respiratory distress. She has no wheezes. She has no rales.  Neurological: She is alert and oriented to person, place, and time.  Skin: Skin is warm. She is not diaphoretic.  Psychiatric: Her behavior is normal.    MAU Course  Procedures none  MDM UA UPT  Flu influenza swab Strep swab  Examined patient in triage; I will call the patient once results are back. I will call in an RX if patient needs treatment.   Assessment and Plan   A: 1. Flu-like symptoms    P: Discharge home in stable condition  Patient called and informed of her results Encouraged rest, fluid and over the counter cold medicine as directed on the bottle If symptoms worsen, patient is encouraged to [facility].   Iona HansenJennifer Irene Rasch, NP 10/28/2013 1:40 PM

## 2013-10-30 LAB — CULTURE, GROUP A STREP

## 2013-11-04 ENCOUNTER — Encounter (HOSPITAL_COMMUNITY): Payer: Self-pay

## 2013-12-26 ENCOUNTER — Inpatient Hospital Stay (HOSPITAL_COMMUNITY): Payer: Self-pay

## 2013-12-26 ENCOUNTER — Inpatient Hospital Stay (HOSPITAL_COMMUNITY)
Admission: AD | Admit: 2013-12-26 | Discharge: 2013-12-27 | Disposition: A | Discharge status: Home / Self Care | Payer: Self-pay | Source: Ambulatory Visit | Attending: Obstetrics & Gynecology | Admitting: Obstetrics & Gynecology

## 2013-12-26 ENCOUNTER — Encounter (HOSPITAL_COMMUNITY): Payer: Self-pay | Admitting: *Deleted

## 2013-12-26 DIAGNOSIS — N949 Unspecified condition associated with female genital organs and menstrual cycle: Secondary | ICD-10-CM | POA: Insufficient documentation

## 2013-12-26 DIAGNOSIS — R102 Pelvic and perineal pain: Secondary | ICD-10-CM

## 2013-12-26 LAB — URINALYSIS, ROUTINE W REFLEX MICROSCOPIC
Bilirubin Urine: NEGATIVE
GLUCOSE, UA: NEGATIVE mg/dL
HGB URINE DIPSTICK: NEGATIVE
Ketones, ur: NEGATIVE mg/dL
Leukocytes, UA: NEGATIVE
Nitrite: NEGATIVE
Protein, ur: NEGATIVE mg/dL
Specific Gravity, Urine: 1.015 (ref 1.005–1.030)
UROBILINOGEN UA: 1 mg/dL (ref 0.0–1.0)
pH: 5.5 (ref 5.0–8.0)

## 2013-12-26 LAB — POCT PREGNANCY, URINE: PREG TEST UR: NEGATIVE

## 2013-12-26 MED ORDER — IBUPROFEN 800 MG PO TABS
800.0000 mg | ORAL_TABLET | Freq: Four times a day (QID) | ORAL | Status: DC | PRN
  Administered 2013-12-26: 800 mg via ORAL
  Filled 2013-12-26: qty 1
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
## 2013-12-26 NOTE — MAU Note (Signed)
Pt states she has "the big pain when she has sex with her partner" when asked if that happened tonight pt denies

## 2013-12-26 NOTE — MAU Note (Signed)
Pt states she has pain in her underbelly. Pt states she fells some palpitations in her chest sometimes but not now.

## 2013-12-27 DIAGNOSIS — N8320 Unspecified ovarian cysts: Secondary | ICD-10-CM

## 2013-12-27 LAB — WET PREP, GENITAL
CLUE CELLS WET PREP: NONE SEEN
Trich, Wet Prep: NONE SEEN
Yeast Wet Prep HPF POC: NONE SEEN

## 2013-12-27 MED ORDER — IBUPROFEN 600 MG PO TABS
600.0000 mg | ORAL_TABLET | Freq: Four times a day (QID) | ORAL | Status: DC | PRN
Start: 2013-12-27 — End: 2014-11-04

## 2013-12-27 NOTE — Discharge Instructions (Signed)
Ovarian Cyst  An ovarian cyst is a fluid-filled sac that forms on an ovary. The ovaries are small organs that produce eggs in women. Various types of cysts can form on the ovaries. Most are not cancerous. Many do not cause problems, and they often go away on their own. Some may cause symptoms and require treatment. Common types of ovarian cysts include:   Functional cysts--These cysts may occur every month during the menstrual cycle. This is normal. The cysts usually go away with the next menstrual cycle if the woman does not get pregnant. Usually, there are no symptoms with a functional cyst.   Endometrioma cysts--These cysts form from the tissue that lines the uterus. They are also called "chocolate cysts" because they become filled with blood that turns brown. This type of cyst can cause pain in the lower abdomen during intercourse and with your menstrual period.   Cystadenoma cysts--This type develops from the cells on the outside of the ovary. These cysts can get very big and cause lower abdomen pain and pain with intercourse. This type of cyst can twist on itself, cut off its blood supply, and cause severe pain. It can also easily rupture and cause a lot of pain.   Dermoid cysts--This type of cyst is sometimes found in both ovaries. These cysts may contain different kinds of body tissue, such as skin, teeth, hair, or cartilage. They usually do not cause symptoms unless they get very big.   Theca lutein cysts--These cysts occur when too much of a certain hormone (human chorionic gonadotropin) is produced and overstimulates the ovaries to produce an egg. This is most common after procedures used to assist with the conception of a baby (in vitro fertilization).  CAUSES    Fertility drugs can cause a condition in which multiple large cysts are formed on the ovaries. This is called ovarian hyperstimulation syndrome.   A condition called polycystic ovary syndrome can cause hormonal imbalances that can lead to  nonfunctional ovarian cysts.  SIGNS AND SYMPTOMS   Many ovarian cysts do not cause symptoms. If symptoms are present, they may include:   Pelvic pain or pressure.   Pain in the lower abdomen.   Pain during sexual intercourse.   Increasing girth (swelling) of the abdomen.   Abnormal menstrual periods.   Increasing pain with menstrual periods.   Stopping having menstrual periods without being pregnant.  DIAGNOSIS   These cysts are commonly found during a routine or annual pelvic exam. Tests may be ordered to find out more about the cyst. These tests may include:   Ultrasound.   X-ray of the pelvis.   CT scan.   MRI.   Blood tests.  TREATMENT   Many ovarian cysts go away on their own without treatment. Your health care provider may want to check your cyst regularly for 2-3 months to see if it changes. For women in menopause, it is particularly important to monitor a cyst closely because of the higher rate of ovarian cancer in menopausal women. When treatment is needed, it may include any of the following:   A procedure to drain the cyst (aspiration). This may be done using a long needle and ultrasound. It can also be done through a laparoscopic procedure. This involves using a thin, lighted tube with a tiny camera on the end (laparoscope) inserted through a small incision.   Surgery to remove the whole cyst. This may be done using laparoscopic surgery or an open surgery involving a larger incision   in the lower abdomen.   Hormone treatment or birth control pills. These methods are sometimes used to help dissolve a cyst.  HOME CARE INSTRUCTIONS    Only take over-the-counter or prescription medicines as directed by your health care provider.   Follow up with your health care provider as directed.   Get regular pelvic exams and Pap tests.  SEEK MEDICAL CARE IF:    Your periods are late, irregular, or painful, or they stop.   Your pelvic pain or abdominal pain does not go away.   Your abdomen becomes  larger or swollen.   You have pressure on your bladder or trouble emptying your bladder completely.   You have pain during sexual intercourse.   You have feelings of fullness, pressure, or discomfort in your stomach.   You lose weight for no apparent reason.   You feel generally ill.   You become constipated.   You lose your appetite.   You develop acne.   You have an increase in body and facial hair.   You are gaining weight, without changing your exercise and eating habits.   You think you are pregnant.  SEEK IMMEDIATE MEDICAL CARE IF:    You have increasing abdominal pain.   You feel sick to your stomach (nauseous), and you throw up (vomit).   You develop a fever that comes on suddenly.   You have abdominal pain during a bowel movement.   Your menstrual periods become heavier than usual.  MAKE SURE YOU:   Understand these instructions.   Will watch your condition.   Will get help right away if you are not doing well or get worse.  Document Released: 12/20/2004 Document Revised: 12/25/2012 Document Reviewed: 08/27/2012  ExitCare Patient Information 2015 [employer]. This information is not intended to replace advice given to you by your health care provider. Make sure you discuss any questions you have with your health care provider.      Pelvic Pain  Female pelvic pain can be caused by many different things and start from a variety of places. Pelvic pain refers to pain that is located in the lower half of the abdomen and between your hips. The pain may occur over a short period of time (acute) or may be reoccurring (chronic). The cause of pelvic pain may be related to disorders affecting the female reproductive organs (gynecologic), but it may also be related to the bladder, kidney stones, an intestinal complication, or muscle or skeletal problems. Getting help right away for pelvic pain is important, especially if there has been severe, sharp, or a sudden onset of unusual pain. It is also  important to get help right away because some types of pelvic pain can be life threatening.   CAUSES   Below are only some of the causes of pelvic pain. The causes of pelvic pain can be in one of several categories.    Gynecologic.   Pelvic inflammatory disease.   Sexually transmitted infection.   Ovarian cyst or a twisted ovarian ligament (ovarian torsion).   Uterine lining that grows outside the uterus (endometriosis).   Fibroids, cysts, or tumors.   Ovulation.   Pregnancy.   Pregnancy that occurs outside the uterus (ectopic pregnancy).   Miscarriage.   Labor.   Abruption of the placenta or ruptured uterus.   Infection.   Uterine infection (endometritis).   Bladder infection.   Diverticulitis.   Miscarriage related to a uterine infection (septic abortion).   Bladder.   Inflammation   of the bladder (cystitis).   Kidney stone(s).   Gastrointestinal.   Constipation.   Diverticulitis.   Neurologic.   Trauma.   Feeling pelvic pain because of mental or emotional causes (psychosomatic).   Cancers of the bowel or pelvis.  EVALUATION   Your caregiver will want to take a careful history of your concerns. This includes recent changes in your health, a careful gynecologic history of your periods (menses), and a sexual history. Obtaining your family history and medical history is also important. Your caregiver may suggest a pelvic exam. A pelvic exam will help identify the location and severity of the pain. It also helps in the evaluation of which organ system may be involved. In order to identify the cause of the pelvic pain and be properly treated, your caregiver may order tests. These tests may include:    A pregnancy test.   Pelvic ultrasonography.   An X-ray exam of the abdomen.   A urinalysis or evaluation of vaginal discharge.   Blood tests.  HOME CARE INSTRUCTIONS    Only take over-the-counter or prescription medicines for pain, discomfort, or fever as directed by your caregiver.     Rest as directed by your caregiver.    Eat a balanced diet.    Drink enough fluids to make your urine clear or pale yellow, or as directed.    Avoid sexual intercourse if it causes pain.    Apply warm or cold compresses to the lower abdomen depending on which one helps the pain.    Avoid stressful situations.    Keep a journal of your pelvic pain. Write down when it started, where the pain is located, and if there are things that seem to be associated with the pain, such as food or your menstrual cycle.   Follow up with your caregiver as directed.   SEEK MEDICAL CARE IF:   Your medicine does not help your pain.   You have abnormal vaginal discharge.  SEEK IMMEDIATE MEDICAL CARE IF:    You have heavy bleeding from the vagina.    Your pelvic pain increases.    You feel light-headed or faint.    You have chills.    You have pain with urination or blood in your urine.    You have uncontrolled diarrhea or vomiting.    You have a fever or persistent symptoms for more than 3 days.   You have a fever and your symptoms suddenly get worse.    You are being physically or sexually abused.   MAKE SURE YOU:   Understand these instructions.   Will watch your condition.   Will get help if you are not doing well or get worse.  Document Released: 11/17/2003 Document Revised: 05/06/2013 Document Reviewed: 04/11/2011  ExitCare Patient Information 2015 [employer]. This information is not intended to replace advice given to you by your health care provider. Make sure you discuss any questions you have with your health care provider.

## 2013-12-30 LAB — GC/CHLAMYDIA PROBE AMP
CT PROBE, AMP APTIMA: NEGATIVE
GC Probe RNA: NEGATIVE

## 2014-05-05 ENCOUNTER — Inpatient Hospital Stay (HOSPITAL_COMMUNITY)
Admission: AD | Admit: 2014-05-05 | Discharge: 2014-05-05 | Disposition: A | Discharge status: Home / Self Care | Payer: Self-pay | Source: Ambulatory Visit | Attending: Obstetrics & Gynecology | Admitting: Obstetrics & Gynecology

## 2014-05-05 ENCOUNTER — Encounter (HOSPITAL_COMMUNITY): Payer: Self-pay | Admitting: *Deleted

## 2014-05-05 DIAGNOSIS — R51 Headache: Secondary | ICD-10-CM

## 2014-05-05 DIAGNOSIS — K5900 Constipation, unspecified: Secondary | ICD-10-CM

## 2014-05-05 HISTORY — DX: Headache: R51

## 2014-05-05 HISTORY — DX: Headache, unspecified: R51.9

## 2014-05-05 LAB — URINALYSIS, ROUTINE W REFLEX MICROSCOPIC
Bilirubin Urine: NEGATIVE
Glucose, UA: NEGATIVE mg/dL
HGB URINE DIPSTICK: NEGATIVE
KETONES UR: NEGATIVE mg/dL
LEUKOCYTES UA: NEGATIVE
NITRITE: NEGATIVE
PH: 5.5 (ref 5.0–8.0)
Protein, ur: NEGATIVE mg/dL
Specific Gravity, Urine: 1.02 (ref 1.005–1.030)
Urobilinogen, UA: 0.2 mg/dL (ref 0.0–1.0)

## 2014-05-05 LAB — POCT PREGNANCY, URINE: Preg Test, Ur: NEGATIVE

## 2014-05-05 MED ORDER — BUTALBITAL-APAP-CAFFEINE 50-325-40 MG PO TABS: 1.0000 | ORAL_TABLET | Freq: Four times a day (QID) | ORAL | Status: DC | PRN

## 2014-05-05 NOTE — MAU Note (Signed)
Pt presents to MAU with complaints of dizziness, constipation and headache. Reports her last menstrual cycle was April 21st but are irregular since her last depo shot last June.

## 2014-05-05 NOTE — MAU Provider Note (Signed)
  History   34 yo female in with c/o headache unrelieved by motrin, constipation, and one bout of dizziness. LMP 2 wks ago.  CSN: [id number]  Arrival date and time: 05/05/14 1728   First Provider Initiated Contact with Patient 05/05/14 1811      No chief complaint on file.  HPI  OB History    Gravida Para Term Preterm AB TAB SAB Ectopic Multiple Living   3 3 3   0  0   2      Past Medical History  Diagnosis Date  . Anemia   . Sickle cell trait     Past Surgical History  Procedure Laterality Date  . No past surgeries      Family History  Problem Relation Age of Onset  . Anesthesia problems Neg Hx     History  Substance Use Topics  . Smoking status: Never Smoker   . Smokeless tobacco: Never Used  . Alcohol Use: No    Allergies: No Known Allergies  Prescriptions prior to admission  Medication Sig Dispense Refill Last Dose  . ibuprofen (ADVIL,MOTRIN) 600 MG tablet Take 1 tablet (600 mg total) by mouth every 6 (six) hours as needed. (Patient taking differently: Take 600 mg by mouth every 6 (six) hours as needed for headache or moderate pain. ) 30 tablet 1 05/05/2014 at Unknown time  . Prenatal Vit-Fe Fumarate-FA (PRENATAL MULTIVITAMIN) TABS Take 1 tablet by mouth daily.   05/04/2014 at Unknown time    Review of Systems  Constitutional: Negative.   Eyes: Negative.   Respiratory: Negative.   Cardiovascular: Negative.   Gastrointestinal: Positive for constipation.  Genitourinary: Negative.   Musculoskeletal: Negative.   Skin: Negative.   Neurological: Positive for headaches.  Endo/Heme/Allergies: Negative.   Psychiatric/Behavioral: Negative.    Physical Exam   Blood pressure 127/82, pulse 83, temperature 98.3 F (36.8 C), resp. rate 18, height 5\' 7"  (1.702 m), weight 215 lb (97.523 kg), last menstrual period 04/24/2014.  Physical Exam  Constitutional: She is oriented to person, place, and time. She appears well-developed and well-nourished.  HENT:  Head:  Normocephalic.  Eyes: Pupils are equal, round, and reactive to light.  Neck: Normal range of motion.  Cardiovascular: Normal rate, regular rhythm, normal heart sounds and intact distal pulses.   Respiratory: Effort normal and breath sounds normal.  GI: Soft. Bowel sounds are normal.  Musculoskeletal: Normal range of motion.  Neurological: She is alert and oriented to person, place, and time. She has normal reflexes.  Skin: Skin is warm and dry.  Psychiatric: She has a normal mood and affect. Her behavior is normal. Judgment and thought content normal.    MAU Course  Procedures  MDM Constipation, headache  Assessment and Plan  VSS, encouraged increased fluids, increased fiber in diet and to start colace BID. wull d/c home.  Wyvonnia DuskyLAWSON, MARIE DARLENE 05/05/2014, 6:12 PM

## 2014-05-05 NOTE — Discharge Instructions (Signed)
General Headache Without Cause A headache is pain or discomfort felt around the head or neck area. The specific cause of a headache may not be found. There are many causes and types of headaches. A few common ones are:  Tension headaches.  Migraine headaches.  Cluster headaches.  Chronic daily headaches. HOME CARE INSTRUCTIONS   Keep all follow-up appointments with your caregiver or any specialist referral.  Only take over-the-counter or prescription medicines for pain or discomfort as directed by your caregiver.  Lie down in a dark, quiet room when you have a headache.  Keep a headache journal to find out what may trigger your migraine headaches. For example, write down:  What you eat and drink.  How much sleep you get.  Any change to your diet or medicines.  Try massage or other relaxation techniques.  Put ice packs or heat on the head and neck. Use these 3 to 4 times per day for 15 to 20 minutes each time, or as needed.  Limit stress.  Sit up straight, and do not tense your muscles.  Quit smoking if you smoke.  Limit alcohol use.  Decrease the amount of caffeine you drink, or stop drinking caffeine.  Eat and sleep on a regular schedule.  Get 7 to 9 hours of sleep, or as recommended by your caregiver.  Keep lights dim if bright lights bother you and make your headaches worse. SEEK MEDICAL CARE IF:   You have problems with the medicines you were prescribed.  Your medicines are not working.  You have a change from the usual headache.  You have nausea or vomiting. SEEK IMMEDIATE MEDICAL CARE IF:   Your headache becomes severe.  You have a fever.  You have a stiff neck.  You have loss of vision.  You have muscular weakness or loss of muscle control.  You start losing your balance or have trouble walking.  You feel faint or pass out.  You have severe symptoms that are different from your first symptoms. MAKE SURE YOU:   Understand these  instructions.  Will watch your condition.  Will get help right away if you are not doing well or get worse. Document Released: 12/20/2004 Document Revised: 03/14/2011 Document Reviewed: 01/05/2011 [facility] Patient Information 2015 [place], [place]. This information is not intended to replace advice given to you by your health care provider. Make sure you discuss any questions you have with your health care provider. Constipation Constipation is when a person has fewer than three bowel movements a week, has difficulty having a bowel movement, or has stools that are dry, hard, or larger than normal. As people grow older, constipation is more common. If you try to fix constipation with medicines that make you have a bowel movement (laxatives), the problem may get worse. Long-term laxative use may cause the muscles of the colon to become weak. A low-fiber diet, not taking in enough fluids, and taking certain medicines may make constipation worse.  CAUSES   Certain medicines, such as antidepressants, pain medicine, iron supplements, antacids, and water pills.   Certain diseases, such as diabetes, irritable bowel syndrome (IBS), thyroid disease, or depression.   Not drinking enough water.   Not eating enough fiber-rich foods.   Stress or travel.   Lack of physical activity or exercise.   Ignoring the urge to have a bowel movement.   Using laxatives too much.  SIGNS AND SYMPTOMS   Having fewer than three bowel movements a week.   Straining to  have a bowel movement.   Having stools that are hard, dry, or larger than normal.   Feeling full or bloated.   Pain in the lower abdomen.   Not feeling relief after having a bowel movement.  DIAGNOSIS  Your health care provider will take a medical history and perform a physical exam. Further testing may be done for severe constipation. Some tests may include:  A barium enema X-ray to examine your rectum, colon, and, sometimes,  your small intestine.   A sigmoidoscopy to examine your lower colon.   A colonoscopy to examine your entire colon. TREATMENT  Treatment will depend on the severity of your constipation and what is causing it. Some dietary treatments include drinking more fluids and eating more fiber-rich foods. Lifestyle treatments may include regular exercise. If these diet and lifestyle recommendations do not help, your health care provider may recommend taking over-the-counter laxative medicines to help you have bowel movements. Prescription medicines may be prescribed if over-the-counter medicines do not work.  HOME CARE INSTRUCTIONS   Eat foods that have a lot of fiber, such as fruits, vegetables, whole grains, and beans.  Limit foods high in fat and processed sugars, such as french fries, hamburgers, cookies, candies, and soda.   A fiber supplement may be added to your diet if you cannot get enough fiber from foods.   Drink enough fluids to keep your urine clear or pale yellow.   Exercise regularly or as directed by your health care provider.   Go to the restroom when you have the urge to go. Do not hold it.   Only take over-the-counter or prescription medicines as directed by your health care provider. Do not take other medicines for constipation without talking to your health care provider first.  SEEK IMMEDIATE MEDICAL CARE IF:   You have bright red blood in your stool.   Your constipation lasts for more than 4 days or gets worse.   You have abdominal or rectal pain.   You have thin, pencil-like stools.   You have unexplained weight loss. MAKE SURE YOU:   Understand these instructions.  Will watch your condition.  Will get help right away if you are not doing well or get worse. Document Released: 09/18/2003 Document Revised: 12/25/2012 Document Reviewed: 10/01/2012 [facility] Patient Information 2015 [place]. This information is not intended to replace advice given  to you by your health care provider. Make sure you discuss any questions you have with your health care provider.

## 2014-09-03 ENCOUNTER — Telehealth (HOSPITAL_COMMUNITY): Payer: Self-pay | Admitting: *Deleted

## 2014-09-03 NOTE — Telephone Encounter (Signed)
Telephoned patient at home # and left message  

## 2014-09-09 ENCOUNTER — Encounter (HOSPITAL_COMMUNITY): Payer: Self-pay | Admitting: *Deleted

## 2014-09-15 ENCOUNTER — Ambulatory Visit (HOSPITAL_COMMUNITY)
Admission: RE | Admit: 2014-09-15 | Discharge: 2014-09-15 | Disposition: A | Discharge status: Home / Self Care | Payer: Self-pay | Source: Ambulatory Visit | Attending: Obstetrics and Gynecology | Admitting: Obstetrics and Gynecology

## 2014-09-15 ENCOUNTER — Encounter (HOSPITAL_COMMUNITY): Payer: Self-pay

## 2014-09-15 VITALS — BP 112/72 | Temp 98.7°F | Ht 66.0 in | Wt 214.0 lb

## 2014-09-15 DIAGNOSIS — R87612 Low grade squamous intraepithelial lesion on cytologic smear of cervix (LGSIL): Secondary | ICD-10-CM

## 2014-09-15 NOTE — Progress Notes (Signed)
CLINIC:   [facility] [employer]) [facility]  REASON FOR VISIT: Well-woman exam   HISTORY OF PRESENT ILLNESS:   Taylor Roman is a 34 y.o. female who presents to the [facility] today for clinical breast exam. No history of a mammogram.  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 This was her first abnormal pap.   REVIEW OF SYSTEMS:   Denies breast pain, nodularity, skin changes, nipple inversion, or nipple discharge bilaterally.  Denies any pelvic pain, pressure, or abnormal vaginal bleeding.   ALLERGIES: No Known Allergies  MEDICATIONS:  Current outpatient prescriptions:  .  ibuprofen (ADVIL,MOTRIN) 600 MG tablet, Take 1 tablet (600 mg total) by mouth every 6 (six) hours as needed. (Patient taking differently: Take 600 mg by mouth every 6 (six) hours as needed for headache or moderate pain. ), Disp: 30 tablet, Rfl: 1 .  butalbital-acetaminophen-caffeine (FIORICET) 50-325-40 MG per tablet, Take 1-2 tablets by mouth every 6 (six) hours as needed for headache. (Patient not taking: Reported on 09/15/2014), Disp: 20 tablet, Rfl: 0 .  Prenatal Vit-Fe Fumarate-FA (PRENATAL MULTIVITAMIN) TABS, Take 1 tablet by mouth daily., Disp: , Rfl:   PHYSICAL EXAM:   BP 112/72 mmHg  Temp(Src) 98.7 F (37.1 C) (Oral)  Ht  (1.676 m)  Wt 214 lb (97.07 kg)  BMI 34.56 kg/m2  LMP 09/06/2014 (Exact Date)  General: Well-nourished, well-appearing female in no acute distress.  She is unaccompanied in clinic today.  Taylor Bang, LPN was present during physical exam for this patient.   Breasts: Bilateral breasts exposed and observed with patient standing (arms at side, arms on hips, arms on hips flexed forward, and arms over head).  No gross abnormalities including breast skin puckering or dimpling noted on observation.  Breasts symmetrical without evidence of skin redness, thickening, or peau d'orange appearance. No nipple retraction or nipple discharge noted  bilaterally.  No breast nodularity palpated in bilateral breasts. Axillary lymph nodes: No axillary lymphadenopathy bilaterally.     ASSESSMENT & PLAN:  1. Breast cancer screening: Taylor Roman has no palpable breast abnormalities on her clinical breast exam today. Mammogram not indicated due to age. She was given instructions and educational materials regarding breast self-awareness. Taylor Roman is aware of this plan and agrees with it.   2. Cervical cancer screening: Taylor Roman has had a recent abnormal pap. She will have a colposcopy as scheduled at the [facility]. Results of the colposcopy will be given to the patient by the clinic.     Taylor Roman was encouraged to ask questions and all questions were answered to her satisfaction.      Clenton Pare, DNP, AGPCNP-BC, [facility] [phone]

## 2014-09-29 ENCOUNTER — Encounter: Payer: Self-pay | Admitting: Obstetrics & Gynecology

## 2014-10-17 ENCOUNTER — Encounter: Payer: Self-pay | Admitting: Medical

## 2014-10-17 ENCOUNTER — Ambulatory Visit (INDEPENDENT_AMBULATORY_CARE_PROVIDER_SITE_OTHER): Payer: Self-pay | Admitting: Medical

## 2014-10-17 VITALS — BP 112/72 | HR 78 | Temp 98.3°F | Resp 20 | Ht 68.0 in | Wt 216.2 lb

## 2014-10-17 DIAGNOSIS — R896 Abnormal cytological findings in specimens from other organs, systems and tissues: Secondary | ICD-10-CM

## 2014-10-17 DIAGNOSIS — IMO0002 Reserved for concepts with insufficient information to code with codable children: Secondary | ICD-10-CM

## 2014-10-17 LAB — POCT PREGNANCY, URINE: Preg Test, Ur: POSITIVE — AB

## 2014-10-17 NOTE — Progress Notes (Signed)
Patient ID: Taylor Roman, female   DOB: 1980-03-11, 34 y.o.   MRN: [id number]     GYNECOLOGY CLINIC COLPOSCOPY PROCEDURE NOTE  Ms. Taylor Ravelingmina Lapid is a 34 y.o. [id number] here for colposcopy for low-grade squamous intraepithelial neoplasia (LGSIL - encompassing HPV,mild dysplasia,CIN I) pap smear on 07/04/14. Discussed role for HPV in cervical dysplasia, need for surveillance. Patient also with +UPT today. LMP 09/15/14.   Patient given informed consent, signed copy in the chart, time out was performed.  Placed in lithotomy position. Cervix viewed with speculum and colposcope after application of acetic acid.   Colposcopy adequate? Yes  No visible lesions, no biopsies obtained. ECC deferred due to early pregnancy  Patient was given post procedure instructions.  Patient advised to start prenatal care ASAP. Pregnancy confirmation letter given.   Marny LowensteinJulie N Wenzel, PA-C 10/17/2014 12:00 PM

## 2014-10-17 NOTE — Patient Instructions (Signed)
Colposcopy  Care After  Colposcopy is a procedure in which a special tool is used to magnify the surface of the cervix. A tissue sample (biopsy) may also be taken. This sample will be looked at for cervical cancer or other problems. After the test:  · You may have some cramping.  · Lie down for a few minutes if you feel lightheaded.  ·  You may have some bleeding which should stop in a few days.  HOME CARE  · Do not have sex or use tampons for 2 to 3 days or as told.  · Only take medicine as told by your doctor.  · Continue to take your birth control pills as usual.  Finding out the results of your test  Ask when your test results will be ready. Make sure you get your test results.  GET HELP RIGHT AWAY IF:  · You are bleeding a lot or are passing blood clots.  · You develop a fever of 102° F (38.9° C) or higher.  · You have abnormal vaginal discharge.  · You have cramps that do not go away with medicine.  · You feel lightheaded, dizzy, or pass out (faint).  MAKE SURE YOU:   · Understand these instructions.  · Will watch your condition.  · Will get help right away if you are not doing well or get worse.     This information is not intended to replace advice given to you by your health care provider. Make sure you discuss any questions you have with your health care provider.     Document Released: 06/08/2007 Document Revised: 03/14/2011 Document Reviewed: 07/19/2012  Elsevier Interactive Patient Education ©2016 [employer]

## 2014-10-17 NOTE — Progress Notes (Signed)
Pt had +UPT today, repeated and confirmed +.  She reports having unprotected sex once since LMP.  She has questions about her abnormal Pap and what will happen if she does not have Colpo today.

## 2014-10-31 LAB — GLUCOSE, POCT (MANUAL RESULT ENTRY): POC GLUCOSE: 72 mg/dL (ref 70–99)

## 2014-11-04 ENCOUNTER — Inpatient Hospital Stay (HOSPITAL_COMMUNITY): Payer: Self-pay

## 2014-11-04 ENCOUNTER — Encounter (HOSPITAL_COMMUNITY): Payer: Self-pay | Admitting: *Deleted

## 2014-11-04 ENCOUNTER — Inpatient Hospital Stay (HOSPITAL_COMMUNITY)
Admission: AD | Admit: 2014-11-04 | Discharge: 2014-11-04 | Disposition: A | Discharge status: Home / Self Care | Payer: Self-pay | Source: Ambulatory Visit | Attending: Obstetrics & Gynecology | Admitting: Obstetrics & Gynecology

## 2014-11-04 DIAGNOSIS — O99011 Anemia complicating pregnancy, first trimester: Secondary | ICD-10-CM | POA: Insufficient documentation

## 2014-11-04 DIAGNOSIS — Y92009 Unspecified place in unspecified non-institutional (private) residence as the place of occurrence of the external cause: Secondary | ICD-10-CM

## 2014-11-04 DIAGNOSIS — R109 Unspecified abdominal pain: Secondary | ICD-10-CM | POA: Insufficient documentation

## 2014-11-04 DIAGNOSIS — O26891 Other specified pregnancy related conditions, first trimester: Secondary | ICD-10-CM | POA: Insufficient documentation

## 2014-11-04 DIAGNOSIS — R102 Pelvic and perineal pain: Secondary | ICD-10-CM

## 2014-11-04 DIAGNOSIS — Z3A01 Less than 8 weeks gestation of pregnancy: Secondary | ICD-10-CM | POA: Insufficient documentation

## 2014-11-04 DIAGNOSIS — D573 Sickle-cell trait: Secondary | ICD-10-CM | POA: Insufficient documentation

## 2014-11-04 DIAGNOSIS — W19XXXA Unspecified fall, initial encounter: Secondary | ICD-10-CM

## 2014-11-04 LAB — WET PREP, GENITAL
Clue Cells Wet Prep HPF POC: NONE SEEN
Trich, Wet Prep: NONE SEEN
Yeast Wet Prep HPF POC: NONE SEEN

## 2014-11-04 LAB — OB RESULTS CONSOLE GC/CHLAMYDIA: GC PROBE AMP, GENITAL: NEGATIVE

## 2014-11-04 LAB — CBC
HCT: 26.4 % — ABNORMAL LOW (ref 36.0–46.0)
Hemoglobin: 9.3 g/dL — ABNORMAL LOW (ref 12.0–15.0)
MCH: 25.7 pg — ABNORMAL LOW (ref 26.0–34.0)
MCHC: 35.2 g/dL (ref 30.0–36.0)
MCV: 72.9 fL — AB (ref 78.0–100.0)
PLATELETS: 245 10*3/uL (ref 150–400)
RBC: 3.62 MIL/uL — ABNORMAL LOW (ref 3.87–5.11)
RDW: 19.1 % — AB (ref 11.5–15.5)
WBC: 7.1 10*3/uL (ref 4.0–10.5)

## 2014-11-04 LAB — URINALYSIS, ROUTINE W REFLEX MICROSCOPIC
BILIRUBIN URINE: NEGATIVE
Glucose, UA: NEGATIVE mg/dL
Hgb urine dipstick: NEGATIVE
Ketones, ur: NEGATIVE mg/dL
Leukocytes, UA: NEGATIVE
Nitrite: NEGATIVE
PH: 6 (ref 5.0–8.0)
Protein, ur: NEGATIVE mg/dL
SPECIFIC GRAVITY, URINE: 1.02 (ref 1.005–1.030)
UROBILINOGEN UA: 1 mg/dL (ref 0.0–1.0)

## 2014-11-04 LAB — HCG, QUANTITATIVE, PREGNANCY: hCG, Beta Chain, Quant, S: 9229 m[IU]/mL — ABNORMAL HIGH (ref <5)

## 2014-11-04 MED ORDER — ACETAMINOPHEN 325 MG PO TABS
650.0000 mg | ORAL_TABLET | Freq: Once | ORAL | Status: AC
  Administered 2014-11-04: 650 mg via ORAL
  Filled 2014-11-04: qty 2

## 2014-11-04 NOTE — MAU Provider Note (Signed)
History     CSN: [id number]  Arrival date and time: 11/04/14 1106   First Provider Initiated Contact with Patient 11/04/14 1357      Chief Complaint  Patient presents with  . Fall   HPI Pt is 100w1d pregnant [phone] who presents with right sided pain after falling yesterday running to get son. Pt c/o of right back/hip pain radiating laterally to mid right abdomen, which hurts more when she moves.  Pt has not taken anything for the pain. Pt denies cramping, spotting, UTI sx, constipation or diarrhea.  Pt gets OB care in [facility]- appointment on 11/17. Pt recently had Colposcopy. RN note:      Expand All Collapse All   Fell yesterday. Was running to get son and fell. Landed on rt side.       Past Medical History  Diagnosis Date  . Anemia   . Sickle cell trait (HCC)   . Headache     Past Surgical History  Procedure Laterality Date  . No past surgeries      Family History  Problem Relation Age of Onset  . Anesthesia problems Neg Hx     Social History  Substance Use Topics  . Smoking status: Never Smoker   . Smokeless tobacco: Never Used  . Alcohol Use: No    Allergies: No Known Allergies  Prescriptions prior to admission  Medication Sig Dispense Refill Last Dose  . butalbital-acetaminophen-caffeine (FIORICET) 50-325-40 MG per tablet Take 1-2 tablets by mouth every 6 (six) hours as needed for headache. (Patient not taking: Reported on 09/15/2014) 20 tablet 0 Not Taking  . folic acid (FOLVITE) 1 MG tablet Take 1 mg by mouth daily.     Marland Kitchen ibuprofen (ADVIL,MOTRIN) 600 MG tablet Take 1 tablet (600 mg total) by mouth every 6 (six) hours as needed. (Patient taking differently: Take 600 mg by mouth every 6 (six) hours as needed for headache or moderate pain. ) 30 tablet 1 Taking  . Prenatal Vit-Fe Fumarate-FA (PRENATAL MULTIVITAMIN) TABS Take 1 tablet by mouth daily.   Taking    Review of Systems  Constitutional: Negative for fever and chills.  Gastrointestinal: Positive  for abdominal pain. Negative for nausea, vomiting, diarrhea and constipation.  Genitourinary: Negative for dysuria.  Musculoskeletal: Positive for back pain.  Neurological: Negative for dizziness and headaches.   Physical Exam   Blood pressure 117/79, pulse 70, resp. rate 18, height  (1.727 m), weight 219 lb 2 oz (99.394 kg), last menstrual period 09/15/2014.  Physical Exam  Nursing note and vitals reviewed. Constitutional: She is oriented to person, place, and time. She appears well-developed and well-nourished. No distress.  HENT:  Head: Normocephalic.  Eyes: Pupils are equal, round, and reactive to light.  Neck: Normal range of motion. Neck supple.  Cardiovascular: Normal rate.   Respiratory: Effort normal.  GI: Soft. She exhibits no distension. There is tenderness. There is no rebound and no guarding.  Mild tenderness right lower mid abd with palpation  Genitourinary:  Small amount of white discharge in vault; cervix clean, NT, uterus 6-8 weeks size, NT; adnexa without palpable enlargement or tenderness  Musculoskeletal: Normal range of motion.  Mild tenderness over right hip  Neurological: She is alert and oriented to person, place, and time.  Skin: Skin is warm and dry.  Psychiatric: She has a normal mood and affect.    MAU Course  Procedures US Ob Comp Less 14 Wks  11/04/2014  CLINICAL DATA:  Pelvic pain in first trimester  pregnancy. Fall at home. Initial encounter. Sickle cell trait. EXAM: OBSTETRIC <14 WK US AND TRANSVAGINAL OB US TECHNIQUE: Both transabdominal and transvaginal ultrasound examinations were performed for complete evaluation of the gestation as well as the maternal uterus, adnexal regions, and pelvic cul-de-sac. Transvaginal technique was performed to assess early pregnancy. COMPARISON:  None. FINDINGS: Intrauterine gestational sac: Visualized/normal in shape. Yolk sac:  Visualized Embryo:  Visualized Cardiac Activity: Visualized Heart Rate: 141  bpm CRL:   10  mm   7 w   1 d                  US EDC: 06/22/2015 Maternal uterus/adnexae: Small to moderate subchorionic hemorrhage noted. Right ovarian corpus luteum cyst noted. A simple appearing left ovarian cyst is also seen measuring 3.4 cm. No suspicious adnexal masses identified. Trace amount of free fluid noted in cul-de-sac. IMPRESSION: Single living IUP measuring 7 weeks 1 day with US EDC of 06/22/2015. This is concordant with LMP. Small to moderate subchorionic hemorrhage. Electronically Signed   By: Myles RosenthalJohn  Stahl M.D.   On: 11/04/2014 14:56   [place] Ob Transvaginal  11/04/2014  CLINICAL DATA:  Pelvic pain in first trimester pregnancy. Fall at home. Initial encounter. Sickle cell trait. EXAM: OBSTETRIC <14 WK US AND TRANSVAGINAL OB US TECHNIQUE: Both transabdominal and transvaginal ultrasound examinations were performed for complete evaluation of the gestation as well as the maternal uterus, adnexal regions, and pelvic cul-de-sac. Transvaginal technique was performed to assess early pregnancy. COMPARISON:  None. FINDINGS: Intrauterine gestational sac: Visualized/normal in shape. Yolk sac:  Visualized Embryo:  Visualized Cardiac Activity: Visualized Heart Rate: 141  bpm CRL:  10  mm   7 w   1 d                  US EDC: 06/22/2015 Maternal uterus/adnexae: Small to moderate subchorionic hemorrhage noted. Right ovarian corpus luteum cyst noted. A simple appearing left ovarian cyst is also seen measuring 3.4 cm. No suspicious adnexal masses identified. Trace amount of free fluid noted in cul-de-sac. IMPRESSION: Single living IUP measuring 7 weeks 1 day with US EDC of 06/22/2015. This is concordant with LMP. Small to moderate subchorionic hemorrhage. Electronically Signed   By: Myles RosenthalJohn  Stahl M.D.   On: 11/04/2014 14:56   Results for orders placed or performed during the hospital encounter of 11/04/14 (from the past 24 hour(s))  Urinalysis, Routine w reflex microscopic (not [facility])     Status: None   Collection Time:  11/04/14 11:16 AM  Result Value Ref Range   Color, Urine YELLOW YELLOW   APPearance CLEAR CLEAR   Specific Gravity, Urine 1.020 1.005 - 1.030   pH 6.0 5.0 - 8.0   Glucose, UA NEGATIVE NEGATIVE mg/dL   Hgb urine dipstick NEGATIVE NEGATIVE   Bilirubin Urine NEGATIVE NEGATIVE   Ketones, ur NEGATIVE NEGATIVE mg/dL   Protein, ur NEGATIVE NEGATIVE mg/dL   Urobilinogen, UA 1.0 0.0 - 1.0 mg/dL   Nitrite NEGATIVE NEGATIVE   Leukocytes, UA NEGATIVE NEGATIVE  CBC     Status: Abnormal   Collection Time: 11/04/14  1:00 PM  Result Value Ref Range   WBC 7.1 4.0 - 10.5 K/uL   RBC 3.62 (L) 3.87 - 5.11 MIL/uL   Hemoglobin 9.3 (L) 12.0 - 15.0 g/dL   HCT 16.126.4 (L) 09.636.0 - 04.546.0 %   MCV 72.9 (L) 78.0 - 100.0 fL   MCH 25.7 (L) 26.0 - 34.0 pg   MCHC 35.2 30.0 - 36.0  g/dL   RDW 16.1 (H) 09.6 - 04.5 %   Platelets 245 150 - 400 K/uL  hCG, quantitative, pregnancy     Status: Abnormal   Collection Time: 11/04/14  1:00 PM  Result Value Ref Range   hCG, Beta Chain, Quant, S 9229 (H) <5 mIU/mL  Wet prep, genital     Status: Abnormal   Collection Time: 11/04/14  3:00 PM  Result Value Ref Range   Yeast Wet Prep HPF POC NONE SEEN NONE SEEN   Trich, Wet Prep NONE SEEN NONE SEEN   Clue Cells Wet Prep HPF POC NONE SEEN NONE SEEN   WBC, Wet Prep HPF POC FEW (A) NONE SEEN  Tylenol  PO given  Assessment and Plan  Muscular skeletal pain- tylenol and comfort measures Abdominal pain in pregnancy- first trimester- viable SLIUP [redacted]w[redacted]d List of safe meds in pregnancy given to pt F/u with OB appointment- return sooner if increase in pain  Lucylle Foulkes 11/04/2014, 1:58 PM

## 2014-11-04 NOTE — Discharge Instructions (Signed)

## 2014-11-04 NOTE — MAU Note (Signed)
Fell yesterday.  Was running to get son and fell.   Landed on rt side.

## 2014-11-04 NOTE — MAU Note (Signed)
Fall oct. 31 about 11am, feel pain in lower back on the right side. No bleeding or spotting. Had sex last night starting having pain so we stopped. Some pain on right side of pevic. Did not call doctor but wanted to make sure baby is ok

## 2014-11-05 LAB — HIV ANTIBODY (ROUTINE TESTING W REFLEX): HIV SCREEN 4TH GENERATION: NONREACTIVE

## 2014-11-05 LAB — GC/CHLAMYDIA PROBE AMP (~~LOC~~) NOT AT ARMC
Chlamydia: NEGATIVE
Neisseria Gonorrhea: NEGATIVE

## 2014-11-12 ENCOUNTER — Encounter (HOSPITAL_COMMUNITY): Payer: Self-pay | Admitting: *Deleted

## 2014-11-12 ENCOUNTER — Inpatient Hospital Stay (HOSPITAL_COMMUNITY)
Admission: AD | Admit: 2014-11-12 | Discharge: 2014-11-12 | Disposition: A | Discharge status: Home / Self Care | Payer: Self-pay | Source: Ambulatory Visit | Attending: Family Medicine | Admitting: Family Medicine

## 2014-11-12 DIAGNOSIS — J069 Acute upper respiratory infection, unspecified: Secondary | ICD-10-CM

## 2014-11-12 DIAGNOSIS — O99511 Diseases of the respiratory system complicating pregnancy, first trimester: Secondary | ICD-10-CM | POA: Insufficient documentation

## 2014-11-12 DIAGNOSIS — Z3A08 8 weeks gestation of pregnancy: Secondary | ICD-10-CM | POA: Insufficient documentation

## 2014-11-12 MED ORDER — ACETAMINOPHEN 500 MG PO TABS
1000.0000 mg | ORAL_TABLET | Freq: Once | ORAL | Status: AC
  Administered 2014-11-12: 1000 mg via ORAL
  Filled 2014-11-12: qty 2

## 2014-11-12 MED ORDER — PSEUDOEPHEDRINE HCL 30 MG PO TABS
60.0000 mg | ORAL_TABLET | Freq: Once | ORAL | Status: AC
  Administered 2014-11-12: 60 mg via ORAL
  Filled 2014-11-12: qty 2

## 2014-11-12 NOTE — MAU Provider Note (Signed)
  History     CSN: [id number]  Arrival date and time: 11/12/14 1945   First Provider Initiated Contact with Patient 11/12/14 2206      Chief Complaint  Patient presents with  . Nasal Congestion   URI  This is a new problem. The current episode started in the past 7 days. The problem has been unchanged. There has been no fever. Associated symptoms include congestion and headaches. Pertinent negatives include no coughing, sneezing or sore throat. Treatments tried: zyrtec. The treatment provided no relief.     Past Medical History  Diagnosis Date  . Anemia   . Sickle cell trait (HCC)   . Headache     Past Surgical History  Procedure Laterality Date  . No past surgeries      Family History  Problem Relation Age of Onset  . Anesthesia problems Neg Hx     Social History  Substance Use Topics  . Smoking status: Never Smoker   . Smokeless tobacco: Never Used  . Alcohol Use: No    Allergies: No Known Allergies  Prescriptions prior to admission  Medication Sig Dispense Refill Last Dose  . butalbital-acetaminophen-caffeine (FIORICET) 50-325-40 MG per tablet Take 1-2 tablets by mouth every 6 (six) hours as needed for headache. (Patient not taking: Reported on 09/15/2014) 20 tablet 0 Not Taking at Unknown time  . Prenatal Vit-Fe Fumarate-FA (PRENATAL MULTIVITAMIN) TABS Take 1 tablet by mouth daily.   11/04/2014 at Unknown time    Review of Systems  Constitutional: Negative for fever.  HENT: Positive for congestion. Negative for sneezing and sore throat.   Respiratory: Negative for cough.   Neurological: Positive for headaches.   Physical Exam   Blood pressure 118/62, pulse 73, temperature 98.8 F (37.1 C), temperature source Oral, resp. rate 20, height 5\' 8"  (1.727 m), weight 98.884 kg (218 lb), last menstrual period 09/15/2014, SpO2 100 %.  Physical Exam  Nursing note and vitals reviewed. Constitutional: She is oriented to person, place, and time. She appears  well-developed and well-nourished. No distress.  HENT:  Head: Normocephalic.  Cardiovascular: Normal rate.   Respiratory: Effort normal.  GI: Soft.  Neurological: She is alert and oriented to person, place, and time.  Skin: Skin is warm and dry.  Psychiatric: She has a normal mood and affect.    MAU Course  Procedures  MDM   Assessment and Plan   1. Viral URI    DC home Comfort measures reviewed  1st Trimester precautions  RX: none, OTC recommendations reviewed  Return to MAU as needed   Follow-up Information    [facility].   Specialty:  Obstetrics and Gynecology   Why:  As scheduled   Contact information:   [address] [place] [id number] [phone]        Tawnya CrookHogan, Khalila Buechner Donovan 11/12/2014, 10:09 PM

## 2014-11-12 NOTE — MAU Note (Signed)
Pt reports she has nasal congestion and is taking Zyrtec. Today she states she feels like she can't breathe well due to the congestion.

## 2014-11-12 NOTE — Discharge Instructions (Signed)

## 2014-11-13 ENCOUNTER — Encounter (HOSPITAL_COMMUNITY): Payer: Self-pay | Admitting: Emergency Medicine

## 2014-11-13 ENCOUNTER — Emergency Department (HOSPITAL_COMMUNITY)
Admission: EM | Admit: 2014-11-13 | Discharge: 2014-11-13 | Disposition: A | Discharge status: Home / Self Care | Payer: Self-pay | Attending: Emergency Medicine | Admitting: Emergency Medicine

## 2014-11-13 DIAGNOSIS — O99350 Diseases of the nervous system complicating pregnancy, unspecified trimester: Secondary | ICD-10-CM | POA: Insufficient documentation

## 2014-11-13 DIAGNOSIS — Z3A00 Weeks of gestation of pregnancy not specified: Secondary | ICD-10-CM | POA: Insufficient documentation

## 2014-11-13 DIAGNOSIS — M542 Cervicalgia: Secondary | ICD-10-CM | POA: Insufficient documentation

## 2014-11-13 DIAGNOSIS — J069 Acute upper respiratory infection, unspecified: Secondary | ICD-10-CM | POA: Insufficient documentation

## 2014-11-13 DIAGNOSIS — O9989 Other specified diseases and conditions complicating pregnancy, childbirth and the puerperium: Secondary | ICD-10-CM | POA: Insufficient documentation

## 2014-11-13 DIAGNOSIS — O99519 Diseases of the respiratory system complicating pregnancy, unspecified trimester: Secondary | ICD-10-CM | POA: Insufficient documentation

## 2014-11-13 DIAGNOSIS — Z862 Personal history of diseases of the blood and blood-forming organs and certain disorders involving the immune mechanism: Secondary | ICD-10-CM | POA: Insufficient documentation

## 2014-11-13 DIAGNOSIS — H9201 Otalgia, right ear: Secondary | ICD-10-CM | POA: Insufficient documentation

## 2014-11-13 MED ORDER — DIPHENHYDRAMINE HCL 25 MG PO CAPS: 25.0000 mg | ORAL_CAPSULE | Freq: Every evening | ORAL | Status: DC | PRN

## 2014-11-13 NOTE — ED Provider Notes (Signed)
CSN: [id number]     Arrival date & time 11/13/14  1743 History  By signing my name below, I, Soijett Blue, attest that this documentation has been prepared under the direction and in the presence of Cheri Fowler, PA-C Electronically Signed: Soijett Blue, ED Scribe. 11/13/2014. 7:03 PM.   Chief Complaint  Patient presents with  . Nasal Congestion      The history is provided by the patient. No language interpreter was used.    Taylor Roman is a 34 y.o. female who presents to the Emergency Department complaining of moderate, constant, nasal congestion onset 5 days ago. She reports that she has had to do mouth breathing due to her moderate nasal congestion. She notes that she was seen at a clinic yesterday and was given tylenol and was informed to buy tylenol and another medicine for her symptoms. She states that bending forward worsens her symptoms. She states that she is having associated symptoms of right sided ear pain, neck pain, HA, and rhinorrhea. She states that she has tried tylenol, zyrtec, and nasal spray with no relief for her symptoms. She denies neck stiffness, sore throat, cough, difficulty breathing, body aches, and any other symptoms.  She is in her first trimester of pregnancy.   Past Medical History  Diagnosis Date  . Anemia   . Sickle cell trait (HCC)   . Headache    Past Surgical History  Procedure Laterality Date  . No past surgeries     Family History  Problem Relation Age of Onset  . Anesthesia problems Neg Hx    Social History  Substance Use Topics  . Smoking status: Never Smoker   . Smokeless tobacco: Never Used  . Alcohol Use: No   OB History    Gravida Para Term Preterm AB TAB SAB Ectopic Multiple Living   0  0   2     Review of Systems 10 Systems reviewed and all are negative for acute change except as noted in the HPI.   Allergies  Review of patient's allergies indicates no known allergies.  Home Medications   Prior to Admission  medications   Medication Sig Start Date End Date Taking? Authorizing Provider  butalbital-acetaminophen-caffeine (FIORICET) 50-325-40 MG per tablet Take 1-2 tablets by mouth every 6 (six) hours as needed for headache. Patient not taking: Reported on 09/15/2014 05/05/14 05/05/15  Montez Morita, CNM  diphenhydrAMINE (BENADRYL) 25 mg capsule Take 1 capsule (25 mg total) by mouth at bedtime as needed. 11/13/14   Cheri Fowler, PA-C  Prenatal Vit-Fe Fumarate-FA (PRENATAL MULTIVITAMIN) TABS Take 1 tablet by mouth daily.    Historical Provider, MD   BP 123/75 mmHg  Pulse 79  Temp(Src) 98 F (36.7 C) (Oral)  Resp 18  Ht  (1.727 m)  Wt 216 lb 4 oz (98.09 kg)  BMI 32.89 kg/m2  SpO2 100%  LMP 09/15/2014 (Exact Date) Physical Exam  Constitutional: She is oriented to person, place, and time. She appears well-developed and well-nourished. No distress.  HENT:  Head: Normocephalic and atraumatic.  Right Ear: Tympanic membrane, external ear and ear canal normal.  Left Ear: Tympanic membrane, external ear and ear canal normal.  Nose: Nose normal. No rhinorrhea. Right sinus exhibits no maxillary sinus tenderness and no frontal sinus tenderness. Left sinus exhibits no maxillary sinus tenderness and no frontal sinus tenderness.  Mouth/Throat: Uvula is midline, oropharynx is clear and moist and mucous membranes are normal. No oropharyngeal exudate or posterior oropharyngeal  erythema.  Eyes: EOM are normal. Pupils are equal, round, and reactive to light.  Neck: Neck supple.  No nuchal rigidity.  Cardiovascular: Normal rate, regular rhythm and normal heart sounds.  Exam reveals no gallop and no friction rub.   No murmur heard. Pulmonary/Chest: Effort normal and breath sounds normal. No respiratory distress. She has no wheezes. She has no rales.  Musculoskeletal: Normal range of motion.  Lymphadenopathy:    She has no cervical adenopathy.  Neurological: She is alert and oriented to person, place, and time.   Skin: Skin is warm and dry.  Psychiatric: She has a normal mood and affect. Her behavior is normal.  Nursing note and vitals reviewed.   ED Course  Procedures (including critical care time) DIAGNOSTIC STUDIES: Oxygen Saturation is 100% on RA, nl by my interpretation.    COORDINATION OF CARE: 6:55 PM Discussed treatment plan with pt at bedside which includes continue nasal spray and benadryl PRN and pt agreed to plan.   Labs Review Labs Reviewed - No data to display  Imaging Review No results found.    EKG Interpretation None      MDM   Final diagnoses:  Viral URI    Patient presents with URI symptoms x 5 days.  No nuchal rigidity, cough, or sore throat.  VSS, NAD.  HEENT exam unremarkable.  Heart RRR, lungs CTAB.  Suspect viral URI.  Will give tylenol and benadryl.  Evaluation does not show pathology requring ongoing emergent intervention or admission. Pt is hemodynamically stable and mentating appropriately. Discussed findings/results and plan with patient/guardian, who agrees with plan. All questions answered. Return precautions discussed and outpatient follow up given.   I personally performed the services described in this documentation, which was scribed in my presence. The recorded information has been reviewed and is accurate.    Cheri FowlerKayla Tehran Rabenold, PA-C 11/13/14 1917  Nelva Nayobert Beaton, MD 11/13/14 [id number]

## 2014-11-13 NOTE — ED Notes (Signed)
Pt, stated, nasal stuffines started 5 days ago, can't breath through my nose.

## 2014-11-13 NOTE — Discharge Instructions (Signed)
Upper Respiratory Infection, Adult Most upper respiratory infections (URIs) are a viral infection of the air passages leading to the lungs. A URI affects the nose, throat, and upper air passages. The most common type of URI is nasopharyngitis and is typically referred to as "the common cold." URIs run their course and usually go away on their own. Most of the time, a URI does not require medical attention, but sometimes a bacterial infection in the upper airways can follow a viral infection. This is called a secondary infection. Sinus and middle ear infections are common types of secondary upper respiratory infections. Bacterial pneumonia can also complicate a URI. A URI can worsen asthma and chronic obstructive pulmonary disease (COPD). Sometimes, these complications can require emergency medical care and may be life threatening.  CAUSES Almost all URIs are caused by viruses. A virus is a type of germ and can spread from one person to another.  RISKS FACTORS You may be at risk for a URI if:   You smoke.   You have chronic heart or lung disease.  You have a weakened defense (immune) system.   You are very young or very old.   You have nasal allergies or asthma.  You work in crowded or poorly ventilated areas.  You work in health care facilities or schools. SIGNS AND SYMPTOMS  Symptoms typically develop 2-3 days after you come in contact with a cold virus. Most viral URIs last 7-10 days. However, viral URIs from the influenza virus (flu virus) can last 14-18 days and are typically more severe. Symptoms may include:   Runny or stuffy (congested) nose.   Sneezing.   Cough.   Sore throat.   Headache.   Fatigue.   Fever.   Loss of appetite.   Pain in your forehead, behind your eyes, and over your cheekbones (sinus pain).  Muscle aches.  DIAGNOSIS  Your health care provider may diagnose a URI by:  Physical exam.  Tests to check that your symptoms are not due to  another condition such as:  Strep throat.  Sinusitis.  Pneumonia.  Asthma. TREATMENT  A URI goes away on its own with time. It cannot be cured with medicines, but medicines may be prescribed or recommended to relieve symptoms. Medicines may help:  Reduce your fever.  Reduce your cough.  Relieve nasal congestion. HOME CARE INSTRUCTIONS   Take medicines only as directed by your health care provider.   Gargle warm saltwater or take cough drops to comfort your throat as directed by your health care provider.  Use a warm mist humidifier or inhale steam from a shower to increase air moisture. This may make it easier to breathe.  Drink enough fluid to keep your urine clear or pale yellow.   Eat soups and other clear broths and maintain good nutrition.   Rest as needed.   Return to work when your temperature has returned to normal or as your health care provider advises. You may need to stay home longer to avoid infecting others. You can also use a face mask and careful hand washing to prevent spread of the virus.  Increase the usage of your inhaler if you have asthma.   Do not use any tobacco products, including cigarettes, chewing tobacco, or electronic cigarettes. If you need help quitting, ask your health care provider. PREVENTION  The best way to protect yourself from getting a cold is to practice good hygiene.   Avoid oral or hand contact with people with cold   symptoms.   Wash your hands often if contact occurs.  There is no clear evidence that vitamin C, vitamin E, echinacea, or exercise reduces the chance of developing a cold. However, it is always recommended to get plenty of rest, exercise, and practice good nutrition.  SEEK MEDICAL CARE IF:   You are getting worse rather than better.   Your symptoms are not controlled by medicine.   You have chills.  You have worsening shortness of breath.  You have brown or red mucus.  You have yellow or brown nasal  discharge.  You have pain in your face, especially when you bend forward.  You have a fever.  You have swollen neck glands.  You have pain while swallowing.  You have white areas in the back of your throat. SEEK IMMEDIATE MEDICAL CARE IF:   You have severe or persistent:  Headache.  Ear pain.  Sinus pain.  Chest pain.  You have chronic lung disease and any of the following:  Wheezing.  Prolonged cough.  Coughing up blood.  A change in your usual mucus.  You have a stiff neck.  You have changes in your:  Vision.  Hearing.  Thinking.  Mood. MAKE SURE YOU:   Understand these instructions.  Will watch your condition.  Will get help right away if you are not doing well or get worse.   This information is not intended to replace advice given to you by your health care provider. Make sure you discuss any questions you have with your health care provider.   Document Released: 06/15/2000 Document Revised: 05/06/2014 Document Reviewed: 03/27/2013 Elsevier Interactive Patient Education 2016 [employer]  

## 2014-11-14 ENCOUNTER — Telehealth: Payer: Self-pay | Admitting: *Deleted

## 2014-11-14 NOTE — Telephone Encounter (Addendum)
Pt left message yesterday stating that she has a stuffy nose for 5 days and wants to know what medication she can take. Please call back. *Pt has new Ob appt scheduled on 11/17.  Call returned to pt @ 1115 and left message that she may take Sudafed OTC for stuffy nose. Other medications which are permissible are Tylenol, Zyrtec or Allegra, all cough syrups, throat spray and any type of cough drop or sore throat lozenge. Reminder of clinic appt on 11/17 @ 0825 stated.

## 2014-11-20 ENCOUNTER — Ambulatory Visit (INDEPENDENT_AMBULATORY_CARE_PROVIDER_SITE_OTHER): Payer: Self-pay | Admitting: Family

## 2014-11-20 ENCOUNTER — Encounter: Payer: Self-pay | Admitting: Family

## 2014-11-20 VITALS — BP 109/66 | HR 64 | Temp 98.0°F | Wt 217.7 lb

## 2014-11-20 DIAGNOSIS — O0991 Supervision of high risk pregnancy, unspecified, first trimester: Secondary | ICD-10-CM

## 2014-11-20 DIAGNOSIS — O0990 Supervision of high risk pregnancy, unspecified, unspecified trimester: Secondary | ICD-10-CM | POA: Insufficient documentation

## 2014-11-20 DIAGNOSIS — O09891 Supervision of other high risk pregnancies, first trimester: Secondary | ICD-10-CM

## 2014-11-20 DIAGNOSIS — O99011 Anemia complicating pregnancy, first trimester: Secondary | ICD-10-CM

## 2014-11-20 DIAGNOSIS — D571 Sickle-cell disease without crisis: Secondary | ICD-10-CM

## 2014-11-20 LAB — POCT URINALYSIS DIP (DEVICE)
Bilirubin Urine: NEGATIVE
GLUCOSE, UA: NEGATIVE mg/dL
Hgb urine dipstick: NEGATIVE
KETONES UR: NEGATIVE mg/dL
Nitrite: NEGATIVE
PH: 6 (ref 5.0–8.0)
PROTEIN: NEGATIVE mg/dL
Specific Gravity, Urine: 1.015 (ref 1.005–1.030)
UROBILINOGEN UA: 0.2 mg/dL (ref 0.0–1.0)

## 2014-11-20 NOTE — Progress Notes (Signed)
Had pap over the summer with [facility]. Cultures done in [place]. Early 1 hr.

## 2014-11-20 NOTE — Patient Instructions (Signed)
First Trimester of Pregnancy The first trimester of pregnancy is from week 1 until the end of week 12 (months 1 through 3). A week after a sperm fertilizes an egg, the egg will implant on the wall of the uterus. This embryo will begin to develop into a baby. Genes from you and your partner are forming the baby. The female genes determine whether the baby is a boy or a girl. At 6-8 weeks, the eyes and face are formed, and the heartbeat can be seen on ultrasound. At the end of 12 weeks, all the baby's organs are formed.  Now that you are pregnant, you will want to do everything you can to have a healthy baby. Two of the most important things are to get good prenatal care and to follow your health care provider's instructions. Prenatal care is all the medical care you receive before the baby's birth. This care will help prevent, find, and treat any problems during the pregnancy and childbirth. BODY CHANGES Your body goes through many changes during pregnancy. The changes vary from woman to woman.   You may gain or lose a couple of pounds at first.  You may feel sick to your stomach (nauseous) and throw up (vomit). If the vomiting is uncontrollable, call your health care provider.  You may tire easily.  You may develop headaches that can be relieved by medicines approved by your health care provider.  You may urinate more often. Painful urination may mean you have a bladder infection.  You may develop heartburn as a result of your pregnancy.  You may develop constipation because certain hormones are causing the muscles that push waste through your intestines to slow down.  You may develop hemorrhoids or swollen, bulging veins (varicose veins).  Your breasts may begin to grow larger and become tender. Your nipples may stick out more, and the tissue that surrounds them (areola) may become darker.  Your gums may bleed and may be sensitive to brushing and flossing.  Dark spots or blotches (chloasma,  mask of pregnancy) may develop on your face. This will likely fade after the baby is born.  Your menstrual periods will stop.  You may have a loss of appetite.  You may develop cravings for certain kinds of food.  You may have changes in your emotions from day to day, such as being excited to be pregnant or being concerned that something may go wrong with the pregnancy and baby.  You may have more vivid and strange dreams.  You may have changes in your hair. These can include thickening of your hair, rapid growth, and changes in texture. Some women also have hair loss during or after pregnancy, or hair that feels dry or thin. Your hair will most likely return to normal after your baby is born. WHAT TO EXPECT AT YOUR PRENATAL VISITS During a routine prenatal visit:  You will be weighed to make sure you and the baby are growing normally.  Your blood pressure will be taken.  Your abdomen will be measured to track your baby's growth.  The fetal heartbeat will be listened to starting around week 10 or 12 of your pregnancy.  Test results from any previous visits will be discussed. Your health care provider may ask you:  How you are feeling.  If you are feeling the baby move.  If you have had any abnormal symptoms, such as leaking fluid, bleeding, severe headaches, or abdominal cramping.  If you are using any tobacco products,   including cigarettes, chewing tobacco, and electronic cigarettes.  If you have any questions. Other tests that may be performed during your first trimester include:  Blood tests to find your blood type and to check for the presence of any previous infections. They will also be used to check for low iron levels (anemia) and Rh antibodies. Later in the pregnancy, blood tests for diabetes will be done along with other tests if problems develop.  Urine tests to check for infections, diabetes, or protein in the urine.  An ultrasound to confirm the proper growth  and development of the baby.  An amniocentesis to check for possible genetic problems.  Fetal screens for spina bifida and Down syndrome.  You may need other tests to make sure you and the baby are doing well.  HIV (human immunodeficiency virus) testing. Routine prenatal testing includes screening for HIV, unless you choose not to have this test. HOME CARE INSTRUCTIONS  Medicines  Follow your health care provider's instructions regarding medicine use. Specific medicines may be either safe or unsafe to take during pregnancy.  Take your prenatal vitamins as directed.  If you develop constipation, try taking a stool softener if your health care provider approves. Diet  Eat regular, well-balanced meals. Choose a variety of foods, such as meat or vegetable-based protein, fish, milk and low-fat dairy products, vegetables, fruits, and whole grain breads and cereals. Your health care provider will help you determine the amount of weight gain that is right for you.  Avoid raw meat and uncooked cheese. These carry germs that can cause birth defects in the baby.  Eating four or five small meals rather than three large meals a day may help relieve nausea and vomiting. If you start to feel nauseous, eating a few soda crackers can be helpful. Drinking liquids between meals instead of during meals also seems to help nausea and vomiting.  If you develop constipation, eat more high-fiber foods, such as fresh vegetables or fruit and whole grains. Drink enough fluids to keep your urine clear or pale yellow. Activity and Exercise  Exercise only as directed by your health care provider. Exercising will help you:  Control your weight.  Stay in shape.  Be prepared for labor and delivery.  Experiencing pain or cramping in the lower abdomen or low back is a good sign that you should stop exercising. Check with your health care provider before continuing normal exercises.  Try to avoid standing for long  periods of time. Move your legs often if you must stand in one place for a long time.  Avoid heavy lifting.  Wear low-heeled shoes, and practice good posture.  You may continue to have sex unless your health care provider directs you otherwise. Relief of Pain or Discomfort  Wear a good support bra for breast tenderness.   Take warm sitz baths to soothe any pain or discomfort caused by hemorrhoids. Use hemorrhoid cream if your health care provider approves.   Rest with your legs elevated if you have leg cramps or low back pain.  If you develop varicose veins in your legs, wear support hose. Elevate your feet for 15 minutes, 3-4 times a day. Limit salt in your diet. Prenatal Care  Schedule your prenatal visits by the twelfth week of pregnancy. They are usually scheduled monthly at first, then more often in the last 2 months before delivery.  Write down your questions. Take them to your prenatal visits.  Keep all your prenatal visits as directed by your   health care provider. Safety  Wear your seat belt at all times when driving.  Make a list of emergency phone numbers, including numbers for family, friends, the hospital, and police and fire departments. General Tips  Ask your health care provider for a referral to a local prenatal education class. Begin classes no later than at the beginning of month 6 of your pregnancy.  Ask for help if you have counseling or nutritional needs during pregnancy. Your health care provider can offer advice or refer you to specialists for help with various needs.  Do not use hot tubs, steam rooms, or saunas.  Do not douche or use tampons or scented sanitary pads.  Do not cross your legs for long periods of time.  Avoid cat litter boxes and soil used by cats. These carry germs that can cause birth defects in the baby and possibly loss of the fetus by miscarriage or stillbirth.  Avoid all smoking, herbs, alcohol, and medicines not prescribed by  your health care provider. Chemicals in these affect the formation and growth of the baby.  Do not use any tobacco products, including cigarettes, chewing tobacco, and electronic cigarettes. If you need help quitting, ask your health care provider. You may receive counseling support and other resources to help you quit.  Schedule a dentist appointment. At home, brush your teeth with a soft toothbrush and be gentle when you floss. SEEK MEDICAL CARE IF:   You have dizziness.  You have mild pelvic cramps, pelvic pressure, or nagging pain in the abdominal area.  You have persistent nausea, vomiting, or diarrhea.  You have a bad smelling vaginal discharge.  You have pain with urination.  You notice increased swelling in your face, hands, legs, or ankles. SEEK IMMEDIATE MEDICAL CARE IF:   You have a fever.  You are leaking fluid from your vagina.  You have spotting or bleeding from your vagina.  You have severe abdominal cramping or pain.  You have rapid weight gain or loss.  You vomit blood or material that looks like coffee grounds.  You are exposed to German measles and have never had them.  You are exposed to fifth disease or chickenpox.  You develop a severe headache.  You have shortness of breath.  You have any kind of trauma, such as from a fall or a car accident.   This information is not intended to replace advice given to you by your health care provider. Make sure you discuss any questions you have with your health care provider.   Document Released: 12/14/2000 Document Revised: 01/10/2014 Document Reviewed: 10/30/2012 Elsevier Interactive Patient Education 2016 [employer]  

## 2014-11-20 NOTE — Progress Notes (Signed)
First screen 12/16/14 @ 830a with [facility].
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
## 2014-11-21 LAB — PRESCRIPTION MONITORING PROFILE (19 PANEL)
Amphetamine/Meth: NEGATIVE ng/mL
BUPRENORPHINE, URINE: NEGATIVE ng/mL
Barbiturate Screen, Urine: NEGATIVE ng/mL
Benzodiazepine Screen, Urine: NEGATIVE ng/mL
Cannabinoid Scrn, Ur: NEGATIVE ng/mL
Carisoprodol, Urine: NEGATIVE ng/mL
Cocaine Metabolites: NEGATIVE ng/mL
Creatinine, Urine: 82.22 mg/dL (ref >20.0)
ECSTASY: NEGATIVE ng/mL
FENTANYL URINE: NEGATIVE ng/mL
MEPERIDINE UR: NEGATIVE ng/mL
METHADONE SCREEN, URINE: NEGATIVE ng/mL
Methaqualone: NEGATIVE ng/mL
NITRITES URINE, INITIAL: NEGATIVE ug/mL
Opiate Screen, Urine: NEGATIVE ng/mL
Oxycodone Screen, Ur: NEGATIVE ng/mL
PROPOXYPHENE: NEGATIVE ng/mL
Phencyclidine, Ur: NEGATIVE ng/mL
TRAMADOL UR: NEGATIVE ng/mL
Tapentadol, urine: NEGATIVE ng/mL
Zolpidem, Urine: NEGATIVE ng/mL
pH, Initial: 5.7 pH (ref 4.5–8.9)

## 2014-11-21 LAB — ANTIBODY SCREEN: Antibody Screen: NEGATIVE

## 2014-11-21 LAB — ABO AND RH: Rh Type: POSITIVE

## 2014-11-21 LAB — HEPATITIS B SURFACE ANTIGEN: HEP B S AG: NEGATIVE

## 2014-11-21 LAB — CULTURE, OB URINE
Colony Count: NO GROWTH
Organism ID, Bacteria: NO GROWTH

## 2014-11-21 LAB — GLUCOSE TOLERANCE, 1 HOUR (50G) W/O FASTING: GLUCOSE 1 HOUR GTT: 130 mg/dL (ref 70–140)

## 2014-11-21 LAB — RPR

## 2014-12-16 ENCOUNTER — Encounter (HOSPITAL_COMMUNITY): Payer: Self-pay

## 2014-12-16 ENCOUNTER — Ambulatory Visit (HOSPITAL_COMMUNITY)
Admission: RE | Admit: 2014-12-16 | Discharge: 2014-12-16 | Disposition: A | Discharge status: Home / Self Care | Payer: Self-pay | Source: Ambulatory Visit | Attending: Family | Admitting: Family

## 2014-12-16 ENCOUNTER — Other Ambulatory Visit: Payer: Self-pay | Admitting: Family

## 2014-12-16 ENCOUNTER — Ambulatory Visit (HOSPITAL_COMMUNITY): Admission: RE | Admit: 2014-12-16 | Payer: Self-pay | Source: Ambulatory Visit

## 2014-12-16 VITALS — BP 138/78 | HR 82 | Wt 221.4 lb

## 2014-12-16 DIAGNOSIS — Z369 Encounter for antenatal screening, unspecified: Secondary | ICD-10-CM

## 2014-12-16 DIAGNOSIS — D5700 Hb-SS disease with crisis, unspecified: Secondary | ICD-10-CM

## 2014-12-16 DIAGNOSIS — Z3A13 13 weeks gestation of pregnancy: Secondary | ICD-10-CM

## 2014-12-16 DIAGNOSIS — O09521 Supervision of elderly multigravida, first trimester: Secondary | ICD-10-CM

## 2014-12-16 DIAGNOSIS — Z315 Encounter for genetic counseling: Secondary | ICD-10-CM | POA: Insufficient documentation

## 2014-12-16 DIAGNOSIS — O99011 Anemia complicating pregnancy, first trimester: Secondary | ICD-10-CM

## 2014-12-16 DIAGNOSIS — O09529 Supervision of elderly multigravida, unspecified trimester: Secondary | ICD-10-CM | POA: Insufficient documentation

## 2014-12-16 DIAGNOSIS — O0991 Supervision of high risk pregnancy, unspecified, first trimester: Secondary | ICD-10-CM

## 2014-12-16 NOTE — Progress Notes (Addendum)
Genetic Counseling  High-Risk Gestation Note  Appointment Date:  12/16/2014 Referred By: [place] Roman, CNM Date of Birth:  10-Dec-1980   Pregnancy History: [id number] Estimated Date of Delivery: 06/22/15 Estimated Gestational Age: [redacted]w[redacted]d Attending: Particia Nearing, MD   [place] Roman was seen for genetic counseling because of a maternal age of 34 y.o..    In Summary:  Reviewed approximate 1 in 86 risk for fetal aneuploidy related to maternal age at delivery  NT ultrasound performed today and within normal limits  Patient declined amniocentesis  Detailed ultrasound scheduled 01/23/15  Patient interested in screening for chromosome conditions but concerned about cost of tests since she is self-pay  Discussed variable sensitivity and specificity for first screen, NIPS, and Quad screen  Also discussed self-pay prices for these screens  Patient prefers to have Quad screen in second trimester through [place], which would be offered at no charge for [facility] patients  Patient has sickle cell disease (hemoglobin [place] disease). Maternal fetal medicine consult scheduled for 01/23/15; Father of the pregnancy reportedly has normal screen.   She was counseled regarding maternal age and the association with risk for chromosome conditions due to nondisjunction with aging of the ova.   We reviewed chromosomes, nondisjunction, and the associated 1 in 98 risk for fetal aneuploidy related to a maternal age of 34 y.o. at [redacted]w[redacted]d gestation.  She was counseled that the risk for aneuploidy decreases as gestational age increases, accounting for those pregnancies which spontaneously abort.  We specifically discussed Down syndrome (trisomy 29), trisomies 81 and 66, and sex chromosome aneuploidies (47,XXX and 47,XXY) including the common features and prognoses of each.   We reviewed available screening options including First Screen, Quad screen, noninvasive prenatal screening (NIPS)/cell  free DNA (cfDNA) testing, and detailed ultrasound.  She was counseled that screening tests are used to modify a patient's a priori risk for aneuploidy, typically based on age. This estimate provides a pregnancy specific risk assessment. We reviewed the benefits and limitations of each option. Specifically, we discussed the conditions for which each test screens, the detection rates, and false positive rates of each. She was also counseled regarding diagnostic testing via amniocentesis. We reviewed the approximate 1 in 300-500 risk for complications for amniocentesis, including spontaneous pregnancy loss.  Taylor Roman declined amniocentesis. She was interested in screening but concerned about the financial aspects given that she is self-pay. We discussed the approximate out of pocket expenses for the various screens including first screen (~$160 for blood work), NIPS (~$145 for those who qualify for compassionate care price), and Quad screen (offered at no charge through [facility] for [facility] patients).  After consideration of all the options, she elected to proceed with Quad screen in the second trimester through [facility].   Nuchal translucency ultrasound was performed today.  The report will be documented separately.  Detailed ultrasound was scheduled for 01/23/15. She understands that screening tests cannot rule out all birth defects or genetic syndromes. The patient was advised of this limitation and states she still does not want additional testing at this time.   Both family histories were reviewed and found to be contributory for sickle cell anemia (hemoglobin [place] disease) for the patient, her sister, her brother, and her sister's son. The patient reported that she required hospitalization in [place] but has not required hospitalization since she has been here. She reported that she is not currently followed by a physician locally to manage her  [place]  disease. The patient reported that she occasionally has bone pain, for which she takes tylenol. The father of the pregnancy reportedly had a normal screen for sickle cell trait. It is not known if he was screened for all hemoglobin variants or only hemoglobin S (sickle cell trait).   Sickle cell anemia affects the shape and function of the red blood cell by producing abnormal hemoglobin. Hemoglobin is a protein in the RBCs that carries oxygen to the body's organs. Individuals who have SCA have a changes within the genes that codes for hemoglobin. This couple was counseled that SCA is inherited in an autosomal recessive manner, and occurs when both copies of the hemoglobin gene are changed and produce an abnormal hemoglobin S. Typically, one abnormal gene for the production of hemoglobin S is inherited from each parent. A carrier of SCA has one altered copy of the gene for hemoglobin and one typical working copy. Carriers of recessive conditions typically do not have symptoms related to the condition because they still have one functioning copy of the gene, and thus some production of the typical protein coded for by that gene.   We reviewed that given the recessive inheritance of [place] disease, all of Taylor Roman's children will either have sickle cell trait (Hb S) or hemoglobin C trait. If the father of the pregnancy has normal hemoglobin, then the pregnancy would not be at increased risk for a hemoglobinopathy. However, if he carries a hemoglobin variant, recurrence risk for a pregnancy together would be 50%. Hemoglobin electrophoresis is available to the father of the pregnancy, if not previously performed, to screen for other hemoglobin variants, such as hemoglobin C, in addition to hemoglobin S in order to accurately assess recurrence risk. We also reviewed the availability of newborn screening in [place] for hemoglobinopathies.   The patient reported consanguinity between herself and the father of the  pregnancy; Their paternal grandfathers are paternal half-siblings. Thus, the patient and her partner are sixth degree relatives. We discussed that children born to a consanguineous couple are at increased risk for genetic health problems.  This increase in risk is related to the possibility of passing on recessive genes. Every person carries approximately 7-10 non-working genes that when received in a double dose results in recessive genetic conditions.  In general, unrelated couples have a relatively low risk of having a child with a recessive condition because the likelihood of both parents carrying the same non-working recessive gene is very low.  However, when a couple is related, they have inherited some of their genetic information from the same family member, which leads to an increased chance that they may carry the same recessive gene and have a child with a recessive condition. Given the degree of relation, the chance for birth defects, multifactorial conditions, and genetic conditions would be slightly above the general population risk.  Without further information regarding the provided family history, an accurate genetic risk cannot be calculated. Further genetic counseling is warranted if more information is obtained.  The father of the pregnancy is 36 years old. Advanced paternal age (APA) is defined as paternal age greater than or equal to age 36.  Recent large-scale sequencing studies have shown that approximately 80% of de novo point mutations are of paternal origin.  Many studies have demonstrated a strong correlation between increased paternal age and de novo point mutations.  Although no specific data is available regarding fetal risks for fathers 6945+ years old at conception, it is apparent that  the overall risk for single gene conditions is increased.  To estimate the relative increase in risk of a genetic disorder with APA, the heritability of the disease must be considered.  Assuming an  approximate 2x increase in risk for conditions that are exclusively paternal in origin, the risk for each individual condition is still relatively low.  It is estimated that the overall chance for a de novo mutation is ~0.5%.  There is a wide range of conditions which can be caused by new dominant gene mutations (achondroplasia, neurofibromatosis, Marfan syndrome etc.).  Genetic testing for each individual single gene condition is not warranted or available unless ultrasound or family history concerns lend suspicion to a specific condition. A follow up ultrasound at ~28 weeks would be available to monitor fetal growth.   Ms. [place] Roman denied exposure to environmental toxins or chemical agents. She denied the use of alcohol, tobacco or street drugs. She denied significant viral illnesses during the course of her pregnancy. Her medical and surgical histories were noncontributory.   I counseled Ms. [place] Roman regarding the above risks and available options.  The approximate face-to-face time with the genetic counselor was 40 minutes.  Quinn Plowman, MS,  Certified Genetic Counselor 12/16/2014

## 2014-12-18 ENCOUNTER — Ambulatory Visit (INDEPENDENT_AMBULATORY_CARE_PROVIDER_SITE_OTHER): Payer: Self-pay | Admitting: Certified Nurse Midwife

## 2014-12-18 VITALS — BP 129/65 | HR 62 | Temp 98.4°F | Wt 216.6 lb

## 2014-12-18 DIAGNOSIS — O09521 Supervision of elderly multigravida, first trimester: Secondary | ICD-10-CM

## 2014-12-18 DIAGNOSIS — D571 Sickle-cell disease without crisis: Secondary | ICD-10-CM

## 2014-12-18 DIAGNOSIS — O0992 Supervision of high risk pregnancy, unspecified, second trimester: Secondary | ICD-10-CM

## 2014-12-18 DIAGNOSIS — O09522 Supervision of elderly multigravida, second trimester: Secondary | ICD-10-CM

## 2014-12-18 DIAGNOSIS — O99011 Anemia complicating pregnancy, first trimester: Secondary | ICD-10-CM

## 2014-12-18 LAB — POCT URINALYSIS DIP (DEVICE)
Bilirubin Urine: NEGATIVE
GLUCOSE, UA: NEGATIVE mg/dL
Hgb urine dipstick: NEGATIVE
Ketones, ur: NEGATIVE mg/dL
LEUKOCYTES UA: NEGATIVE
NITRITE: NEGATIVE
Protein, ur: NEGATIVE mg/dL
SPECIFIC GRAVITY, URINE: 1.015 (ref 1.005–1.030)
UROBILINOGEN UA: 0.2 mg/dL (ref 0.0–1.0)
pH: 6.5 (ref 5.0–8.0)

## 2014-12-18 NOTE — Patient Instructions (Signed)

## 2014-12-18 NOTE — Progress Notes (Signed)
Educated pt on [place] 

## 2014-12-18 NOTE — Progress Notes (Signed)
Subjective:  Taylor Roman is a [age] y.o. [id number] at 3371w3d being seen today for ongoing prenatal care.  She is currently monitored for the following issues for this high-risk pregnancy and has Sickle cell disease (HCC); High risk HPV infection; LGSIL (low grade squamous intraepithelial dysplasia); Supervision of high risk pregnancy, antepartum; and Advanced maternal age in multigravida on her problem list.  Patient reports not sleeping well. Advised to take benadryl before going to bed..  Contractions: Not present. Vag. Bleeding: None.   . Denies leaking of fluid.   The following portions of the patient's history were reviewed and updated as appropriate: allergies, current medications, past family history, past medical history, past social history, past surgical history and problem list. Problem list updated.  Objective:   Filed Vitals:   12/18/14 0912  BP: 129/65  Pulse: 62  Temp: 98.4 F (36.9 C)  Weight: 216 lb 9.6 oz (98.249 kg)    Fetal Status: Fetal Heart Rate (bpm): 159         General:  Alert, oriented and cooperative. Patient is in no acute distress.  Skin: Skin is warm and dry. No rash noted.   Cardiovascular: Normal heart rate noted  Respiratory: Normal respiratory effort, no problems with respiration noted  Abdomen: Soft, gravid, appropriate for gestational age. Pain/Pressure: Absent     Pelvic: Vag. Bleeding: None Vag D/C Character: White   Cervical exam deferred        Extremities: Normal range of motion.  Edema: None  Mental Status: Normal mood and affect. Normal behavior. Normal judgment and thought content.   Urinalysis: Urine Protein: Negative Urine Glucose: Negative  Assessment and Plan:  Pregnancy: [id number] at 8271w3d  1. Advanced maternal age in multigravida, second trimester   2. Hb-SS disease without crisis (HCC)   3. Supervision of high risk pregnancy, antepartum, second trimester   Preterm labor symptoms and general obstetric precautions including but  not limited to vaginal bleeding, contractions, leaking of fluid and fetal movement were reviewed in detail with the patient. Please refer to After Visit Summary for other counseling recommendations.  No Follow-up on file.   [facility] Roman, CNM

## 2015-01-04 NOTE — L&D Delivery Note (Signed)
Delivery Note At 10:04 PM a viable and healthy female was delivered via Vaginal, Spontaneous Delivery (Presentation: ; Occiput Anterior).  APGAR: 8, 9; weight  pending.   Placenta status: Intact, Spontaneous.  Cord: 3 vessels with the following complications: None.  Cord pH: not sent  Anesthesia: Epidural  Episiotomy: None Lacerations:  2nd degree perineal/vaginal Suture Repair: 3.0 vicryl Est. Blood Loss (mL):    Mom to postpartum.  Baby to Couplet care / Skin to Skin.  [name] 06/22/2015, 10:54 PM

## 2015-01-15 ENCOUNTER — Other Ambulatory Visit (HOSPITAL_COMMUNITY): Payer: Self-pay | Admitting: *Deleted

## 2015-01-15 ENCOUNTER — Encounter: Payer: Self-pay | Admitting: Family Medicine

## 2015-01-15 ENCOUNTER — Ambulatory Visit (INDEPENDENT_AMBULATORY_CARE_PROVIDER_SITE_OTHER): Payer: Self-pay | Admitting: Family Medicine

## 2015-01-15 VITALS — BP 119/82 | HR 75 | Temp 97.5°F | Wt 221.0 lb

## 2015-01-15 DIAGNOSIS — Z23 Encounter for immunization: Secondary | ICD-10-CM

## 2015-01-15 DIAGNOSIS — O99012 Anemia complicating pregnancy, second trimester: Secondary | ICD-10-CM

## 2015-01-15 DIAGNOSIS — D571 Sickle-cell disease without crisis: Secondary | ICD-10-CM

## 2015-01-15 DIAGNOSIS — IMO0002 Reserved for concepts with insufficient information to code with codable children: Secondary | ICD-10-CM

## 2015-01-15 DIAGNOSIS — O0992 Supervision of high risk pregnancy, unspecified, second trimester: Secondary | ICD-10-CM

## 2015-01-15 DIAGNOSIS — O09522 Supervision of elderly multigravida, second trimester: Secondary | ICD-10-CM

## 2015-01-15 LAB — POCT URINALYSIS DIP (DEVICE)
Bilirubin Urine: NEGATIVE
GLUCOSE, UA: NEGATIVE mg/dL
Hgb urine dipstick: NEGATIVE
KETONES UR: NEGATIVE mg/dL
LEUKOCYTES UA: NEGATIVE
Nitrite: NEGATIVE
PROTEIN: NEGATIVE mg/dL
SPECIFIC GRAVITY, URINE: 1.015 (ref 1.005–1.030)
Urobilinogen, UA: 0.2 mg/dL (ref 0.0–1.0)
pH: 5.5 (ref 5.0–8.0)

## 2015-01-15 LAB — QUAD SCREEN FOR MFM

## 2015-01-15 NOTE — Progress Notes (Signed)
Subjective:  Taylor Roman is a 35 y.o. [id number] at 7366w3d being seen today for ongoing prenatal care.  She is currently monitored for the following issues for this high-risk pregnancy and has Sickle cell disease (HCC); High risk HPV infection; LGSIL (low grade squamous intraepithelial dysplasia); Supervision of high risk pregnancy, antepartum; and Advanced maternal age in multigravida on her problem list.  Patient reports no complaints.  Contractions: Not present. Vag. Bleeding: None.  Movement: Present. Denies leaking of fluid.   The following portions of the patient's history were reviewed and updated as appropriate: allergies, current medications, past family history, past medical history, past social history, past surgical history and problem list. Problem list updated.  Objective:   Filed Vitals:   01/15/15 0914  BP: 119/82  Pulse: 75  Temp: 97.5 F (36.4 C)  Weight: 221 lb (100.245 kg)    Fetal Status: Fetal Heart Rate (bpm): 153   Movement: Present     General:  Alert, oriented and cooperative. Patient is in no acute distress.  Skin: Skin is warm and dry. No rash noted.   Cardiovascular: Normal heart rate noted  Respiratory: Normal respiratory effort, no problems with respiration noted  Abdomen: Soft, gravid, appropriate for gestational age. Pain/Pressure: Present     Pelvic: Vag. Bleeding: None     Cervical exam deferred        Extremities: Normal range of motion.  Edema: None  Mental Status: Normal mood and affect. Normal behavior. Normal judgment and thought content.   Urinalysis: Urine Protein: Negative Urine Glucose: Negative  Assessment and Plan:  Pregnancy: [id number] at 5866w3d  1. Supervision of high risk pregnancy, antepartum, second trimester  - Rubella screen - Flu Vaccine QUAD 36+ mos IM; Standing Anatomy scheduled for next week.   2. Hb-SS disease without crisis (HCC) Stable for now  3. Advanced maternal age in multigravida, second trimester - AFP, Quad  Screen   4. LGSIL (low grade squamous intraepithelial dysplasia) Needs f/u pp  General obstetric precautions including but not limited to vaginal bleeding, contractions, leaking of fluid and fetal movement were reviewed in detail with the patient. Please refer to After Visit Summary for other counseling recommendations.  Return in 4 weeks (on 02/12/2015).   Reva Boresanya S Anyae Griffith, MD

## 2015-01-15 NOTE — Progress Notes (Signed)
Reviewed tip of week with patient  

## 2015-01-15 NOTE — Patient Instructions (Signed)
Second Trimester of Pregnancy The second trimester is from week 13 through week 28, months 4 through 6. The second trimester is often a time when you feel your best. Your body has also adjusted to being pregnant, and you begin to feel better physically. Usually, morning sickness has lessened or quit completely, you may have more energy, and you may have an increase in appetite. The second trimester is also a time when the fetus is growing rapidly. At the end of the sixth month, the fetus is about 9 inches long and weighs about 1 pounds. You will likely begin to feel the baby move (quickening) between 18 and 20 weeks of the pregnancy. BODY CHANGES Your body goes through many changes during pregnancy. The changes vary from woman to woman.   Your weight will continue to increase. You will notice your lower abdomen bulging out.  You may begin to get stretch marks on your hips, abdomen, and breasts.  You may develop headaches that can be relieved by medicines approved by your health care provider.  You may urinate more often because the fetus is pressing on your bladder.  You may develop or continue to have heartburn as a result of your pregnancy.  You may develop constipation because certain hormones are causing the muscles that push waste through your intestines to slow down.  You may develop hemorrhoids or swollen, bulging veins (varicose veins).  You may have back pain because of the weight gain and pregnancy hormones relaxing your joints between the bones in your pelvis and as a result of a shift in weight and the muscles that support your balance.  Your breasts will continue to grow and be tender.  Your gums may bleed and may be sensitive to brushing and flossing.  Dark spots or blotches (chloasma, mask of pregnancy) may develop on your face. This will likely fade after the baby is born.  A dark line from your belly button to the pubic area (linea nigra) may appear. This will likely  fade after the baby is born.  You may have changes in your hair. These can include thickening of your hair, rapid growth, and changes in texture. Some women also have hair loss during or after pregnancy, or hair that feels dry or thin. Your hair will most likely return to normal after your baby is born. WHAT TO EXPECT AT YOUR PRENATAL VISITS During a routine prenatal visit:  You will be weighed to make sure you and the fetus are growing normally.  Your blood pressure will be taken.  Your abdomen will be measured to track your baby's growth.  The fetal heartbeat will be listened to.  Any test results from the previous visit will be discussed. Your health care provider may ask you:  How you are feeling.  If you are feeling the baby move.  If you have had any abnormal symptoms, such as leaking fluid, bleeding, severe headaches, or abdominal cramping.  If you are using any tobacco products, including cigarettes, chewing tobacco, and electronic cigarettes.  If you have any questions. Other tests that may be performed during your second trimester include:  Blood tests that check for:  Low iron levels (anemia).  Gestational diabetes (between 24 and 28 weeks).  Rh antibodies.  Urine tests to check for infections, diabetes, or protein in the urine.  An ultrasound to confirm the proper growth and development of the baby.  An amniocentesis to check for possible genetic problems.  Fetal screens for spina bifida   and Down syndrome.  HIV (human immunodeficiency virus) testing. Routine prenatal testing includes screening for HIV, unless you choose not to have this test. HOME CARE INSTRUCTIONS   Avoid all smoking, herbs, alcohol, and unprescribed drugs. These chemicals affect the formation and growth of the baby.  Do not use any tobacco products, including cigarettes, chewing tobacco, and electronic cigarettes. If you need help quitting, ask your health care provider. You may receive  counseling support and other resources to help you quit.  Follow your health care provider's instructions regarding medicine use. There are medicines that are either safe or unsafe to take during pregnancy.  Exercise only as directed by your health care provider. Experiencing uterine cramps is a good sign to stop exercising.  Continue to eat regular, healthy meals.  Wear a good support bra for breast tenderness.  Do not use hot tubs, steam rooms, or saunas.  Wear your seat belt at all times when driving.  Avoid raw meat, uncooked cheese, cat litter boxes, and soil used by cats. These carry germs that can cause birth defects in the baby.  Take your prenatal vitamins.  Take 1500-2000 mg of calcium daily starting at the 20th week of pregnancy until you deliver your baby.  Try taking a stool softener (if your health care provider approves) if you develop constipation. Eat more high-fiber foods, such as fresh vegetables or fruit and whole grains. Drink plenty of fluids to keep your urine clear or pale yellow.  Take warm sitz baths to soothe any pain or discomfort caused by hemorrhoids. Use hemorrhoid cream if your health care provider approves.  If you develop varicose veins, wear support hose. Elevate your feet for 15 minutes, 3-4 times a day. Limit salt in your diet.  Avoid heavy lifting, wear low heel shoes, and practice good posture.  Rest with your legs elevated if you have leg cramps or low back pain.  Visit your dentist if you have not gone yet during your pregnancy. Use a soft toothbrush to brush your teeth and be gentle when you floss.  A sexual relationship may be continued unless your health care provider directs you otherwise.  Continue to go to all your prenatal visits as directed by your health care provider. SEEK MEDICAL CARE IF:   You have dizziness.  You have mild pelvic cramps, pelvic pressure, or nagging pain in the abdominal area.  You have persistent nausea,  vomiting, or diarrhea.  You have a bad smelling vaginal discharge.  You have pain with urination. SEEK IMMEDIATE MEDICAL CARE IF:   You have a fever.  You are leaking fluid from your vagina.  You have spotting or bleeding from your vagina.  You have severe abdominal cramping or pain.  You have rapid weight gain or loss.  You have shortness of breath with chest pain.  You notice sudden or extreme swelling of your face, hands, ankles, feet, or legs.  You have not felt your baby move in over an hour.  You have severe headaches that do not go away with medicine.  You have vision changes.   This information is not intended to replace advice given to you by your health care provider. Make sure you discuss any questions you have with your health care provider.   Document Released: 12/14/2000 Document Revised: 01/10/2014 Document Reviewed: 02/21/2012 Elsevier Interactive Patient Education 2016 [employer]   Breastfeeding Deciding to breastfeed is one of the best choices you can make for you and your baby. A change   in hormones during pregnancy causes your breast tissue to grow and increases the number and size of your milk ducts. These hormones also allow proteins, sugars, and fats from your blood supply to make breast milk in your milk-producing glands. Hormones prevent breast milk from being released before your baby is born as well as prompt milk flow after birth. Once breastfeeding has begun, thoughts of your baby, as well as his or her sucking or crying, can stimulate the release of milk from your milk-producing glands.  BENEFITS OF BREASTFEEDING For Your Baby  Your first milk (colostrum) helps your baby's digestive system function better.  There are antibodies in your milk that help your baby fight off infections.  Your baby has a lower incidence of asthma, allergies, and sudden infant death syndrome.  The nutrients in breast milk are better for your baby than infant  formulas and are designed uniquely for your baby's needs.  Breast milk improves your baby's brain development.  Your baby is less likely to develop other conditions, such as childhood obesity, asthma, or type 2 diabetes mellitus. For You  Breastfeeding helps to create a very special bond between you and your baby.  Breastfeeding is convenient. Breast milk is always available at the correct temperature and costs nothing.  Breastfeeding helps to burn calories and helps you lose the weight gained during pregnancy.  Breastfeeding makes your uterus contract to its prepregnancy size faster and slows bleeding (lochia) after you give birth.   Breastfeeding helps to lower your risk of developing type 2 diabetes mellitus, osteoporosis, and breast or ovarian cancer later in life. SIGNS THAT YOUR BABY IS HUNGRY Early Signs of Hunger  Increased alertness or activity.  Stretching.  Movement of the head from side to side.  Movement of the head and opening of the mouth when the corner of the mouth or cheek is stroked (rooting).  Increased sucking sounds, smacking lips, cooing, sighing, or squeaking.  Hand-to-mouth movements.  Increased sucking of fingers or hands. Late Signs of Hunger  Fussing.  Intermittent crying. Extreme Signs of Hunger Signs of extreme hunger will require calming and consoling before your baby will be able to breastfeed successfully. Do not wait for the following signs of extreme hunger to occur before you initiate breastfeeding:  Restlessness.  A loud, strong cry.  Screaming. BREASTFEEDING BASICS Breastfeeding Initiation  Find a comfortable place to sit or lie down, with your neck and back well supported.  Place a pillow or rolled up blanket under your baby to bring him or her to the level of your breast (if you are seated). Nursing pillows are specially designed to help support your arms and your baby while you breastfeed.  Make sure that your baby's  abdomen is facing your abdomen.  Gently massage your breast. With your fingertips, massage from your chest wall toward your nipple in a circular motion. This encourages milk flow. You may need to continue this action during the feeding if your milk flows slowly.  Support your breast with 4 fingers underneath and your thumb above your nipple. Make sure your fingers are well away from your nipple and your baby's mouth.  Stroke your baby's lips gently with your finger or nipple.  When your baby's mouth is open wide enough, quickly bring your baby to your breast, placing your entire nipple and as much of the colored area around your nipple (areola) as possible into your baby's mouth.  More areola should be visible above your baby's upper lip than   below the lower lip.  Your baby's tongue should be between his or her lower gum and your breast.  Ensure that your baby's mouth is correctly positioned around your nipple (latched). Your baby's lips should create a seal on your breast and be turned out (everted).  It is common for your baby to suck about 2-3 minutes in order to start the flow of breast milk. Latching Teaching your baby how to latch on to your breast properly is very important. An improper latch can cause nipple pain and decreased milk supply for you and poor weight gain in your baby. Also, if your baby is not latched onto your nipple properly, he or she may swallow some air during feeding. This can make your baby fussy. Burping your baby when you switch breasts during the feeding can help to get rid of the air. However, teaching your baby to latch on properly is still the best way to prevent fussiness from swallowing air while breastfeeding. Signs that your baby has successfully latched on to your nipple:  Silent tugging or silent sucking, without causing you pain.  Swallowing heard between every 3-4 sucks.  Muscle movement above and in front of his or her ears while sucking. Signs  that your baby has not successfully latched on to nipple:  Sucking sounds or smacking sounds from your baby while breastfeeding.  Nipple pain. If you think your baby has not latched on correctly, slip your finger into the corner of your baby's mouth to break the suction and place it between your baby's gums. Attempt breastfeeding initiation again. Signs of Successful Breastfeeding Signs from your baby:  A gradual decrease in the number of sucks or complete cessation of sucking.  Falling asleep.  Relaxation of his or her body.  Retention of a small amount of milk in his or her mouth.  Letting go of your breast by himself or herself. Signs from you:  Breasts that have increased in firmness, weight, and size 1-3 hours after feeding.  Breasts that are softer immediately after breastfeeding.  Increased milk volume, as well as a change in milk consistency and color by the fifth day of breastfeeding.  Nipples that are not sore, cracked, or bleeding. Signs That Your Baby is Getting Enough Milk  Wetting at least 3 diapers in a 24-hour period. The urine should be clear and pale yellow by age 5 days.  At least 3 stools in a 24-hour period by age 5 days. The stool should be soft and yellow.  At least 3 stools in a 24-hour period by age 7 days. The stool should be seedy and yellow.  No loss of weight greater than 10% of birth weight during the first 3 days of age.  Average weight gain of 4-7 ounces (113-198 g) per week after age 4 days.  Consistent daily weight gain by age 5 days, without weight loss after the age of 2 weeks. After a feeding, your baby may spit up a small amount. This is common. BREASTFEEDING FREQUENCY AND DURATION Frequent feeding will help you make more milk and can prevent sore nipples and breast engorgement. Breastfeed when you feel the need to reduce the fullness of your breasts or when your baby shows signs of hunger. This is called "breastfeeding on demand." Avoid  introducing a pacifier to your baby while you are working to establish breastfeeding (the first 4-6 weeks after your baby is born). After this time you may choose to use a pacifier. Research has shown that   pacifier use during the first year of a baby's life decreases the risk of sudden infant death syndrome (SIDS). Allow your baby to feed on each breast as long as he or she wants. Breastfeed until your baby is finished feeding. When your baby unlatches or falls asleep while feeding from the first breast, offer the second breast. Because newborns are often sleepy in the first few weeks of life, you may need to awaken your baby to get him or her to feed. Breastfeeding times will vary from baby to baby. However, the following rules can serve as a guide to help you ensure that your baby is properly fed:  Newborns (babies 4 weeks of age or younger) may breastfeed every 1-3 hours.  Newborns should not go longer than 3 hours during the day or 5 hours during the night without breastfeeding.  You should breastfeed your baby a minimum of 8 times in a 24-hour period until you begin to introduce solid foods to your baby at around 6 months of age. BREAST MILK PUMPING Pumping and storing breast milk allows you to ensure that your baby is exclusively fed your breast milk, even at times when you are unable to breastfeed. This is especially important if you are going back to work while you are still breastfeeding or when you are not able to be present during feedings. Your lactation consultant can give you guidelines on how long it is safe to store breast milk. A breast pump is a machine that allows you to pump milk from your breast into a sterile bottle. The pumped breast milk can then be stored in a refrigerator or freezer. Some breast pumps are operated by hand, while others use electricity. Ask your lactation consultant which type will work best for you. Breast pumps can be purchased, but some hospitals and  breastfeeding support groups lease breast pumps on a monthly basis. A lactation consultant can teach you how to hand express breast milk, if you prefer not to use a pump. CARING FOR YOUR BREASTS WHILE YOU BREASTFEED Nipples can become dry, cracked, and sore while breastfeeding. The following recommendations can help keep your breasts moisturized and healthy:  Avoid using soap on your nipples.  Wear a supportive bra. Although not required, special nursing bras and tank tops are designed to allow access to your breasts for breastfeeding without taking off your entire bra or top. Avoid wearing underwire-style bras or extremely tight bras.  Air dry your nipples for 3-4minutes after each feeding.  Use only cotton bra pads to absorb leaked breast milk. Leaking of breast milk between feedings is normal.  Use lanolin on your nipples after breastfeeding. Lanolin helps to maintain your skin's normal moisture barrier. If you use pure lanolin, you do not need to wash it off before feeding your baby again. Pure lanolin is not toxic to your baby. You may also hand express a few drops of breast milk and gently massage that milk into your nipples and allow the milk to air dry. In the first few weeks after giving birth, some women experience extremely full breasts (engorgement). Engorgement can make your breasts feel heavy, warm, and tender to the touch. Engorgement peaks within 3-5 days after you give birth. The following recommendations can help ease engorgement:  Completely empty your breasts while breastfeeding or pumping. You may want to start by applying warm, moist heat (in the shower or with warm water-soaked hand towels) just before feeding or pumping. This increases circulation and helps the milk   flow. If your baby does not completely empty your breasts while breastfeeding, pump any extra milk after he or she is finished.  Wear a snug bra (nursing or regular) or tank top for 1-2 days to signal your body  to slightly decrease milk production.  Apply ice packs to your breasts, unless this is too uncomfortable for you.  Make sure that your baby is latched on and positioned properly while breastfeeding. If engorgement persists after 48 hours of following these recommendations, contact your health care provider or a lactation consultant. OVERALL HEALTH CARE RECOMMENDATIONS WHILE BREASTFEEDING  Eat healthy foods. Alternate between meals and snacks, eating 3 of each per day. Because what you eat affects your breast milk, some of the foods may make your baby more irritable than usual. Avoid eating these foods if you are sure that they are negatively affecting your baby.  Drink milk, fruit juice, and water to satisfy your thirst (about 10 glasses a day).  Rest often, relax, and continue to take your prenatal vitamins to prevent fatigue, stress, and anemia.  Continue breast self-awareness checks.  Avoid chewing and smoking tobacco. Chemicals from cigarettes that pass into breast milk and exposure to secondhand smoke may harm your baby.  Avoid alcohol and drug use, including marijuana. Some medicines that may be harmful to your baby can pass through breast milk. It is important to ask your health care provider before taking any medicine, including all over-the-counter and prescription medicine as well as vitamin and herbal supplements. It is possible to become pregnant while breastfeeding. If birth control is desired, ask your health care provider about options that will be safe for your baby. SEEK MEDICAL CARE IF:  You feel like you want to stop breastfeeding or have become frustrated with breastfeeding.  You have painful breasts or nipples.  Your nipples are cracked or bleeding.  Your breasts are red, tender, or warm.  You have a swollen area on either breast.  You have a fever or chills.  You have nausea or vomiting.  You have drainage other than breast milk from your nipples.  Your  breasts do not become full before feedings by the fifth day after you give birth.  You feel sad and depressed.  Your baby is too sleepy to eat well.  Your baby is having trouble sleeping.   Your baby is wetting less than 3 diapers in a 24-hour period.  Your baby has less than 3 stools in a 24-hour period.  Your baby's skin or the white part of his or her eyes becomes yellow.   Your baby is not gaining weight by 5 days of age. SEEK IMMEDIATE MEDICAL CARE IF:  Your baby is overly tired (lethargic) and does not want to wake up and feed.  Your baby develops an unexplained fever.   This information is not intended to replace advice given to you by your health care provider. Make sure you discuss any questions you have with your health care provider.   Document Released: 12/20/2004 Document Revised: 09/10/2014 Document Reviewed: 06/13/2012 Elsevier Interactive Patient Education 2016 [employer]  

## 2015-01-16 LAB — RUBELLA SCREEN: RUBELLA: 6.11 {index} — AB (ref <0.90)

## 2015-01-23 ENCOUNTER — Ambulatory Visit (HOSPITAL_COMMUNITY)
Admission: RE | Admit: 2015-01-23 | Discharge: 2015-01-23 | Disposition: A | Discharge status: Home / Self Care | Payer: Self-pay | Source: Ambulatory Visit | Attending: Family | Admitting: Family

## 2015-01-23 ENCOUNTER — Encounter (HOSPITAL_COMMUNITY): Payer: Self-pay

## 2015-01-23 VITALS — BP 116/67 | HR 74 | Wt 223.8 lb

## 2015-01-23 DIAGNOSIS — D571 Sickle-cell disease without crisis: Secondary | ICD-10-CM

## 2015-01-23 DIAGNOSIS — O09521 Supervision of elderly multigravida, first trimester: Secondary | ICD-10-CM

## 2015-01-23 DIAGNOSIS — Z3A18 18 weeks gestation of pregnancy: Secondary | ICD-10-CM | POA: Insufficient documentation

## 2015-01-23 DIAGNOSIS — O283 Abnormal ultrasonic finding on antenatal screening of mother: Secondary | ICD-10-CM

## 2015-01-23 DIAGNOSIS — Z3689 Encounter for other specified antenatal screening: Secondary | ICD-10-CM

## 2015-01-23 DIAGNOSIS — O99012 Anemia complicating pregnancy, second trimester: Secondary | ICD-10-CM | POA: Insufficient documentation

## 2015-01-23 DIAGNOSIS — O09522 Supervision of elderly multigravida, second trimester: Secondary | ICD-10-CM

## 2015-01-23 DIAGNOSIS — O99019 Anemia complicating pregnancy, unspecified trimester: Principal | ICD-10-CM

## 2015-01-23 NOTE — Consult Note (Signed)
MFM Consult, Staff Note:  1. Sickle Cell Disease: I explained to the patient the pathophysiology of sickle cell anemia which results from a single beta chain substitution of glutamic acid by valine. The results are red cells with markedly reduced lifespan and sickling when the RBC becomes deoxygenated and the hemoglobin aggregates. Constant sickling and de-sickling causes membrane damage. These sickling episodes produce ischemia and infarction in the affected organs. These hypoxic episodes in turn produce characteristic pain which is commonly termed a sickle crisis.  The inheritance of sickle cell anemia was explained, as was the fact that the fetus is an obligate carrier. The fetus could be affected with the disease if the father of the pregnancy is a carrier. In that case, prenatal diagnosis would be possible.  Pregnancies with sickle cell anemia are at risk for both fetal and maternal complications. Sickle cell anemia in the affected patient can lead to retarded growth, skeletal changes, and a generally decreased lifespan, while sickle crises cause painful vaso-occlusive episodes in bones, abdomen, chest and back. Cardiovascular manifestations may include cardiomegaly, particularly in patients who are iron-overloaded, systolic murmurs thought to be hyperdynamic flow murmurs and overt heart failure. Pulmonary signs can be associated with increased risk of infection and episodes of vascular occlusion. Abdominal signs may include painful vaso-occlusive episodes, hepatomegaly, hepatitis, cholecystitis and splenic infarctions. Bones and joints may be affected through bone marrow infarction, osteomyelitis and arthritis. Genitourinary signs include hyposthenuria, hematuria and pyelonephritis. The latter poses a particular risk during pregnancy as it may precipitate preterm labor. Neurological manifestations may be due to vascular occlusions and can lead to convulsions and even brain hemorrhage. Visual disturbances  are also common. In the eye, there may be conjunctival vessel changes and vitreous hemorrhage.   Series examining maternal deaths have been too small to determine whether there is an increased risk of death during pregnancy due to sickle cell anemia. However, when maternal death occurred, it was usually in connection with pulmonary embolus or the acute chest syndrome. The incidence of painful crises in women with sickle cell anemia, once thought to be increased, does not necessarily change with pregnancy. The mainstay of management of these crises is hydration, oxygen therapy and pain management.   With respect to fetal complications, the sickling of the RBCs can decrease placental oxygen delivery and place the infant of a mother with sickle cell anemia at increased risk for intrauterine growth restriction as well as fetal demise. Poor placentation in general is more frequently associated with the development of preeclampsia which may in turn provoke medically indicated preterm delivery. In women with sickle cell anemia, the risk for intrauterine growth restriction or preterm delivery is approximately 45% regardless of whether prophylactic transfusions are used during pregnancy. Preterm delivery is also a risk due to asymptomatic bacteruria through an ascending mechanism causing pyelonephritis in relatively hypoxemic kidneys. This then leads to peritoneal irritation and can precipitate preterm labor and delivery.   Because of the accelerated erythropoeisis in these patients, supplemental folic acid should be given. My recommendation would be for 2-4 mg per day. The pregnancy then needs to be watched for bacteriuria, which can lead to pyelonephritis and prematurity. Monthly urine cultures would be appropriate. Fetuses of patients with sickle cell anemia should be evaluated with serial growth ultrasounds, including Doppler studies as clinically indicated. Antenatal testing is appropriate from 32 weeks' of  gestation onward, and fetal well-being should be ascertained every time there is an evaluation for maternal pain crisis. Crises are managed with hydration,  oxygen and pain control. Transfusions are usually reserved for severe symptomatology and anemia below a hemoglobin level of 7 or 8. In recent years, prophylactic transfusions or prophylactic partial exchange transfusions have fallen somewhat out of favor because of concern over iron overload, and the failure of prospective trials to show a significant difference in perinatal outcome. Most observers believe that the prepregnancy course of a woman is a good index of how she will fare during the pregnancy.   2. Echogenic intracardiac foci (EIF, bilateral):  There echogenic intracardiac foci of the left and right cardiac ventricles. I demonstrated the echogenic intracardiac foci (EIF) on the ultrasound screen.  This finding has been implicated as a marker for increased risk of aneuploidy, especially Trisomy 21. However, it is also seen as a normal variant in 2-4% of all pregnancy ultrasound exams.  Although its significance as an isolated finding is not entirely clear, a consensus opinion would be that an isolated EIF (with no other risk factors or suspicious ultrasound findings) does not significantly increase risk of aneuploidy.  With her low risk msQUAD and absence of other sonographic findings,her risk for chromosome abnormality is not increased to the point that amniocentesis is recommended. It is likely that this finding is essentially a "normal variant" and does not change her risk stratification as she remains "low risk."  Furthermore, echogenic focus is not a "heart defect" and no special fetal surveillance or neonatal cardiac evaluation is recommended.   Summary of Recommendations: 1. She will need at least monthly CBC with/without reticulocyte count and electrophoresis to guide hematology with need for transfusion/exchange transfusion.  Please  consider hematology referral simply to establish rapport prior to any potential sickle cell crises. 2. Painful crises should be managed as indicated above with transfusions reserved for symptomatic acute anemia, severe symptomatic chronic anemia, acute chest syndrome with hypoxia. Transfusion may also be useful for protracted pain episodes. 3. Because of the concern for iron overload, additional iron supplementation in patient's with sickle cell anemia is probably best avoided and the patient was encouraged to only take her prenatal vitamin.  4. I recommend that she add supplemental folic acid supplementation at  po daily. 5. Fetal surveillance. added surveillance for sickle cell will include monthly growth ultrasounds beginning at 23-24 weeks and antenatal testing beginning at 32 weeks. 6. Prenatal care elements should include monthly urine cultures and close attention to the possible development of any hypertensive complications during pregnancy (q 2 week BP checks beginning at 24 weeks and then twice weekly beginning at around 32 weeks with NSTs. 7. Delivery mode and timing are by obstetrical indications, recognizing that a fetus of an acutely anemic mother, may be at higher risk for intrapartum hypoxic episodes. Induction may be considered at around 38-39 weeks if not indicated sooner. 8. See prior genetic counseling report (separate). 9. Risk for UTI. Monthly urine culture to detect asymptomatic bacteruria. 10. Risk for preeclampsia/IUGR: Recommend starting low dose aspirin and continued until 2 week prior to scheduled delivery at 38-39 weeks.  Time Spent: I spent in excess of 60 minutes in consultation with this patient to review records, evaluate her case, and provide her with an adequate discussion and education. More than 50% of this time was spent in direct face-to-face counseling.  It was a pleasure seeing your patient in the office today. Thank you for consultation. Please do not  hesitate to contact our service for any further questions.   Thank you,  Louann Sjogren Olene Craven,  MD, MS, FACOG

## 2015-01-23 NOTE — Addendum Note (Signed)
Encounter addended by: Dessie Coma Alioune Hodgkin on: 01/23/2015  9:17 AM<BR>     Documentation filed: Notes Section

## 2015-01-23 NOTE — Progress Notes (Signed)
MFM Consult, Staff Note:  1. Sickle Cell Disease: I explained to the patient the pathophysiology of sickle cell anemia which results from a single beta chain substitution of glutamic acid by valine. The results are red cells with markedly reduced lifespan and sickling when the RBC becomes deoxygenated and the hemoglobin aggregates. Constant sickling and de-sickling causes membrane damage. These sickling episodes produce ischemia and infarction in the affected organs. These hypoxic episodes in turn produce characteristic pain which is commonly termed a sickle crisis.  The inheritance of sickle cell anemia was explained, as was the fact that the fetus is an obligate carrier. The fetus could be affected with the disease if the father of the pregnancy is a carrier. In that case, prenatal diagnosis would be possible.  Pregnancies with sickle cell anemia are at risk for both fetal and maternal complications. Sickle cell anemia in the affected patient can lead to retarded growth, skeletal changes, and a generally decreased lifespan, while sickle crises cause painful vaso-occlusive episodes in bones, abdomen, chest and back. Cardiovascular manifestations may include cardiomegaly, particularly in patients who are iron-overloaded, systolic murmurs thought to be hyperdynamic flow murmurs and overt heart failure. Pulmonary signs can be associated with increased risk of infection and episodes of vascular occlusion. Abdominal signs may include painful vaso-occlusive episodes, hepatomegaly, hepatitis, cholecystitis and splenic infarctions. Bones and joints may be affected through bone marrow infarction, osteomyelitis and arthritis. Genitourinary signs include hyposthenuria, hematuria and pyelonephritis. The latter poses a particular risk during pregnancy as it may precipitate preterm labor. Neurological manifestations may be due to vascular occlusions and can lead to convulsions and even brain hemorrhage. Visual disturbances  are also common. In the eye, there may be conjunctival vessel changes and vitreous hemorrhage.   Series examining maternal deaths have been too small to determine whether there is an increased risk of death during pregnancy due to sickle cell anemia. However, when maternal death occurred, it was usually in connection with pulmonary embolus or the acute chest syndrome. The incidence of painful crises in women with sickle cell anemia, once thought to be increased, does not necessarily change with pregnancy. The mainstay of management of these crises is hydration, oxygen therapy and pain management.   With respect to fetal complications, the sickling of the RBCs can decrease placental oxygen delivery and place the infant of a mother with sickle cell anemia at increased risk for intrauterine growth restriction as well as fetal demise. Poor placentation in general is more frequently associated with the development of preeclampsia which may in turn provoke medically indicated preterm delivery. In women with sickle cell anemia, the risk for intrauterine growth restriction or preterm delivery is approximately 45% regardless of whether prophylactic transfusions are used during pregnancy. Preterm delivery is also a risk due to asymptomatic bacteruria through an ascending mechanism causing pyelonephritis in relatively hypoxemic kidneys. This then leads to peritoneal irritation and can precipitate preterm labor and delivery.   Because of the accelerated erythropoeisis in these patients, supplemental folic acid should be given. My recommendation would be for 2-4 mg per day. The pregnancy then needs to be watched for bacteriuria, which can lead to pyelonephritis and prematurity. Monthly urine cultures would be appropriate. Fetuses of patients with sickle cell anemia should be evaluated with serial growth ultrasounds, including Doppler studies as clinically indicated. Antenatal testing is appropriate from 32 weeks' of  gestation onward, and fetal well-being should be ascertained every time there is an evaluation for maternal pain crisis. Crises are managed with hydration,   oxygen and pain control. Transfusions are usually reserved for severe symptomatology and anemia below a hemoglobin level of 7 or 8. In recent years, prophylactic transfusions or prophylactic partial exchange transfusions have fallen somewhat out of favor because of concern over iron overload, and the failure of prospective trials to show a significant difference in perinatal outcome. Most observers believe that the prepregnancy course of a woman is a good index of how she will fare during the pregnancy.   2. Echogenic intracardiac foci (EIF, bilateral):  There echogenic intracardiac foci of the left and right cardiac ventricles. I demonstrated the echogenic intracardiac foci (EIF) on the ultrasound screen.  This finding has been implicated as a marker for increased risk of aneuploidy, especially Trisomy 21. However, it is also seen as a normal variant in 2-4% of all pregnancy ultrasound exams.  Although its significance as an isolated finding is not entirely clear, a consensus opinion would be that an isolated EIF (with no other risk factors or suspicious ultrasound findings) does not significantly increase risk of aneuploidy.  With her low risk msQUAD and absence of other sonographic findings,her risk for chromosome abnormality is not increased to the point that amniocentesis is recommended. It is likely that this finding is essentially a "normal variant" and does not change her risk stratification as she remains "low risk."  Furthermore, echogenic focus is not a "heart defect" and no special fetal surveillance or neonatal cardiac evaluation is recommended.   Summary of Recommendations: 1. She will need at least monthly CBC with/without reticulocyte count and electrophoresis to guide hematology with need for transfusion/exchange transfusion.  Please  consider hematology referral simply to establish rapport prior to any potential sickle cell crises. 2. Painful crises should be managed as indicated above with transfusions reserved for symptomatic acute anemia, severe symptomatic chronic anemia, acute chest syndrome with hypoxia. Transfusion may also be useful for protracted pain episodes. 3. Because of the concern for iron overload, additional iron supplementation in patient's with sickle cell anemia is probably best avoided and the patient was encouraged to only take her prenatal vitamin.  4. I recommend that she add supplemental folic acid supplementation at 2mg po daily. 5. Fetal surveillance. added surveillance for sickle cell will include monthly growth ultrasounds beginning at 23-24 weeks and antenatal testing beginning at 32 weeks. 6. Prenatal care elements should include monthly urine cultures and close attention to the possible development of any hypertensive complications during pregnancy (q 2 week BP checks beginning at 24 weeks and then twice weekly beginning at around 32 weeks with NSTs. 7. Delivery mode and timing are by obstetrical indications, recognizing that a fetus of an acutely anemic mother, may be at higher risk for intrapartum hypoxic episodes. Induction may be considered at around 38-39 weeks if not indicated sooner. 8. See prior genetic counseling report (separate). 9. Risk for UTI. Monthly urine culture to detect asymptomatic bacteruria. 10. Risk for preeclampsia/IUGR: Recommend starting low dose aspirin and continued until 2 week prior to scheduled delivery at 38-39 weeks.  Time Spent: I spent in excess of 60 minutes in consultation with this patient to review records, evaluate her case, and provide her with an adequate discussion and education. More than 50% of this time was spent in direct face-to-face counseling.  It was a pleasure seeing your patient in the office today. Thank you for consultation. Please do not  hesitate to contact our service for any further questions.   Thank you,  Josepha Barbier Morgan Pericles Carmicheal  Lyncoln Ledgerwood Morgan,   MD, MS, FACOG  

## 2015-01-26 ENCOUNTER — Encounter: Payer: Self-pay | Admitting: *Deleted

## 2015-01-26 DIAGNOSIS — O0990 Supervision of high risk pregnancy, unspecified, unspecified trimester: Secondary | ICD-10-CM

## 2015-02-12 ENCOUNTER — Encounter: Payer: Self-pay | Admitting: Family Medicine

## 2015-02-19 ENCOUNTER — Ambulatory Visit (INDEPENDENT_AMBULATORY_CARE_PROVIDER_SITE_OTHER): Payer: Self-pay | Admitting: Obstetrics & Gynecology

## 2015-02-19 VITALS — BP 131/69 | HR 79 | Temp 98.2°F | Wt 222.6 lb

## 2015-02-19 DIAGNOSIS — O0992 Supervision of high risk pregnancy, unspecified, second trimester: Secondary | ICD-10-CM

## 2015-02-19 DIAGNOSIS — D571 Sickle-cell disease without crisis: Secondary | ICD-10-CM

## 2015-02-19 DIAGNOSIS — O09522 Supervision of elderly multigravida, second trimester: Secondary | ICD-10-CM

## 2015-02-19 DIAGNOSIS — O99019 Anemia complicating pregnancy, unspecified trimester: Secondary | ICD-10-CM

## 2015-02-19 LAB — CBC
HEMATOCRIT: 28.3 % — AB (ref 36.0–46.0)
HEMOGLOBIN: 9 g/dL — AB (ref 12.0–15.0)
MCH: 26.2 pg (ref 26.0–34.0)
MCHC: 31.8 g/dL (ref 30.0–36.0)
MCV: 82.5 fL (ref 78.0–100.0)
MPV: 9.7 fL (ref 8.6–12.4)
Platelets: 249 10*3/uL (ref 150–400)
RBC: 3.43 MIL/uL — AB (ref 3.87–5.11)
RDW: 20.1 % — AB (ref 11.5–15.5)
WBC: 7.9 10*3/uL (ref 4.0–10.5)

## 2015-02-19 LAB — POCT URINALYSIS DIP (DEVICE)
BILIRUBIN URINE: NEGATIVE
GLUCOSE, UA: NEGATIVE mg/dL
Hgb urine dipstick: NEGATIVE
KETONES UR: NEGATIVE mg/dL
Nitrite: NEGATIVE
Protein, ur: NEGATIVE mg/dL
Specific Gravity, Urine: 1.015 (ref 1.005–1.030)
Urobilinogen, UA: 1 mg/dL (ref 0.0–1.0)
pH: 7 (ref 5.0–8.0)

## 2015-02-19 LAB — RETICULOCYTES
ABS Retic: 154.4 10*3/uL (ref 19.0–186.0)
RBC.: 3.43 MIL/uL — ABNORMAL LOW (ref 3.87–5.11)
RETIC CT PCT: 4.5 % — AB (ref 0.4–2.3)

## 2015-02-19 MED ORDER — ASPIRIN EC 81 MG PO TBEC: 81.0000 mg | DELAYED_RELEASE_TABLET | Freq: Every day | ORAL | Status: DC

## 2015-02-19 MED ORDER — FOLIC ACID 1 MG PO TABS: 2.0000 mg | ORAL_TABLET | Freq: Every day | ORAL | Status: DC

## 2015-02-19 MED ORDER — PRENATAL VITAMINS 0.8 MG PO TABS: 1.0000 | ORAL_TABLET | Freq: Every day | ORAL | Status: DC

## 2015-02-19 NOTE — Patient Instructions (Signed)
Return to clinic for any obstetric concerns or go to [facility] for evaluation  

## 2015-02-19 NOTE — Progress Notes (Signed)
Requesting Prenatal Vit

## 2015-02-19 NOTE — Progress Notes (Signed)
Subjective:  Taylor Roman is a 35 y.o. [id number] at [redacted]w[redacted]d being seen today for ongoing prenatal care.  She is currently monitored for the following issues for this high-risk pregnancy and has Sickle cell disease (HCC); High risk HPV infection; LGSIL (low grade squamous intraepithelial dysplasia); Supervision of high risk pregnancy, antepartum; Advanced maternal age in multigravida; and Maternal sickle cell anemia affecting pregnancy, antepartum (HCC) on her problem list.  Patient reports no complaints.  Contractions: Irritability. Vag. Bleeding: None.  Movement: Present. Denies leaking of fluid.   The following portions of the patient's history were reviewed and updated as appropriate: allergies, current medications, past family history, past medical history, past social history, past surgical history and problem list. Problem list updated.  Objective:   Filed Vitals:   02/19/15 0946  BP: 131/69  Pulse: 79  Temp: 98.2 F (36.8 C)  Weight: 222 lb 9.6 oz (100.971 kg)    Fetal Status: Fetal Heart Rate (bpm): 143  Fundal Height: 23 cm Movement: Present     General:  Alert, oriented and cooperative. Patient is in no acute distress.  Skin: Skin is warm and dry. No rash noted.   Cardiovascular: Normal heart rate noted  Respiratory: Normal respiratory effort, no problems with respiration noted  Abdomen: Soft, gravid, appropriate for gestational age. Pain/Pressure: Absent     Pelvic: Vag. Bleeding: None     Cervical exam deferred        Extremities: Normal range of motion.  Edema: None  Mental Status: Normal mood and affect. Normal behavior. Normal judgment and thought content.   Urinalysis: Urine Protein: Negative Urine Glucose: Negative  Assessment and Plan:  Pregnancy: [id number] at [redacted]w[redacted]d  1. Maternal sickle cell anemia affecting pregnancy, antepartum (HCC) MFM Recommendations: 1. She will need at least monthly CBC with/without reticulocyte count and electrophoresis to guide hematology with  need for transfusion/exchange transfusion. Please consider hematology referral simply to establish rapport prior to any potential sickle cell crises. 2. Painful crises should be managed with hydration, oxygen and pain control.as indicated above with transfusions reserved for symptomatic acute anemia, severe symptomatic chronic anemia, acute chest syndrome with hypoxia. Transfusion may also be useful for protracted pain episodes. 3. Because of the concern for iron overload, additional iron supplementation in patient's with sickle cell anemia is probably best avoided and the patient was encouraged to only take her prenatal vitamin.  4. I recommend that she add supplemental folic acid supplementation at  po daily.   Discuss next visit 5. Fetal surveillance. added surveillance for sickle cell will include monthly growth ultrasounds beginning at 23-24 weeks and antenatal testing beginning at 32 weeks. 6. Prenatal care elements should include monthly urine cultures and close attention to the possible development of any hypertensive complications during pregnancy (q 2 week BP checks beginning at 24 weeks and then twice weekly beginning at around 32 weeks with NSTs. 7. Delivery mode and timing are by obstetrical indications, recognizing that a fetus of an acutely anemic mother, may be at higher risk for intrapartum hypoxic episodes. Induction may be considered at around 38-39 weeks if not indicated sooner. 8. See prior genetic counseling report (separate). 9. Risk for UTI. Monthly urine culture to detect asymptomatic bacteruria. 10. Risk for preeclampsia/IUGR: Recommend starting low dose aspirin and continued until 2 week prior to scheduled delivery at 38-39 weeks.  Discuss next visit Orders placed today: - folic acid (FOLVITE) 1 MG tablet; Take 2 tablets (2 mg total) by mouth daily.  Dispense: 60 tablet;  Refill: 10 - aspirin EC 81 MG tablet; Take 1 tablet (81 mg total) by mouth daily. Take after 12  weeks for prevention of preeclampsia later in pregnancy  Dispense: 300 tablet; Refill: 2 - Culture, OB Urine - CBC - Hemoglobinopathy evaluation - Reticulocytes - Prenatal Multivit-Min-Fe-FA (PRENATAL VITAMINS) 0.8 MG tablet; Take 1 tablet by mouth daily.  Dispense: 30 tablet; Refill: 12 Sickle cell pain precautions given.  Stable for now, no recent pain crisis.  2. Advanced maternal age in multigravida, second trimester Normal quad screen and anatomy scan only showed echogenic foci, will follow up subsequent scans  3. Supervision of high risk pregnancy, antepartum, second trimester Preterm labor symptoms and general obstetric precautions including but not limited to vaginal bleeding, contractions, leaking of fluid and fetal movement were reviewed in detail with the patient. Please refer to After Visit Summary for other counseling recommendations.  Return in about 2 weeks (around 03/05/2015) for OB visit.   Tereso Newcomer, MD

## 2015-02-20 ENCOUNTER — Ambulatory Visit (HOSPITAL_COMMUNITY)
Admission: RE | Admit: 2015-02-20 | Discharge: 2015-02-20 | Disposition: A | Discharge status: Home / Self Care | Payer: Self-pay | Source: Ambulatory Visit | Attending: Family | Admitting: Family

## 2015-02-20 ENCOUNTER — Encounter (HOSPITAL_COMMUNITY): Payer: Self-pay

## 2015-02-20 DIAGNOSIS — O99012 Anemia complicating pregnancy, second trimester: Secondary | ICD-10-CM | POA: Insufficient documentation

## 2015-02-20 DIAGNOSIS — Z36 Encounter for antenatal screening of mother: Secondary | ICD-10-CM | POA: Insufficient documentation

## 2015-02-20 DIAGNOSIS — O99019 Anemia complicating pregnancy, unspecified trimester: Secondary | ICD-10-CM

## 2015-02-20 DIAGNOSIS — O09522 Supervision of elderly multigravida, second trimester: Secondary | ICD-10-CM | POA: Insufficient documentation

## 2015-02-20 DIAGNOSIS — D571 Sickle-cell disease without crisis: Secondary | ICD-10-CM | POA: Insufficient documentation

## 2015-02-20 LAB — HEMOGLOBINOPATHY EVALUATION
Hemoglobin Other: 45 % — ABNORMAL HIGH
Hgb A2 Quant: 2.8 % (ref 2.2–3.2)
Hgb A: 0 % — ABNORMAL LOW (ref 96.8–97.8)
Hgb F Quant: 1.9 % (ref 0.0–2.0)
Hgb S Quant: 50.3 % — ABNORMAL HIGH

## 2015-02-20 LAB — CULTURE, OB URINE
Colony Count: NO GROWTH
Organism ID, Bacteria: NO GROWTH

## 2015-03-03 LAB — HGB ELECTROPHORESIS REFLEXED REPORT
Hemoglobin A - HGBRFX: 0 % — ABNORMAL LOW (ref >96.0)
Hemoglobin A2 - HGBRFX: 3.7 % — ABNORMAL HIGH (ref 1.8–3.5)
Hemoglobin Elect C: 45.1 % — ABNORMAL HIGH
Hemoglobin F - HGBRFX: 1.2 % (ref <2.0)
Hemoglobin S - HGBRFX: 50 % — ABNORMAL HIGH
SICKLE SOLUBILITY TEST - HGBRFX: POSITIVE — AB

## 2015-03-04 ENCOUNTER — Telehealth: Payer: Self-pay | Admitting: General Practice

## 2015-03-04 NOTE — Telephone Encounter (Signed)
Patient called and left message stating she is returning our call. Per chart review, call was likely an appt reminder. Called patient, no answer- left message stating we are trying to reach you to return your phone call. It seems the missed call you had was likely reminding you of your upcoming appts. If you have other questions or concerns you may call us back at the clinics

## 2015-03-05 ENCOUNTER — Ambulatory Visit (INDEPENDENT_AMBULATORY_CARE_PROVIDER_SITE_OTHER): Payer: Self-pay | Admitting: Obstetrics and Gynecology

## 2015-03-05 VITALS — BP 126/65 | HR 79 | Temp 98.0°F | Wt 228.8 lb

## 2015-03-05 DIAGNOSIS — O09522 Supervision of elderly multigravida, second trimester: Secondary | ICD-10-CM

## 2015-03-05 DIAGNOSIS — O99012 Anemia complicating pregnancy, second trimester: Secondary | ICD-10-CM

## 2015-03-05 DIAGNOSIS — O99019 Anemia complicating pregnancy, unspecified trimester: Principal | ICD-10-CM

## 2015-03-05 DIAGNOSIS — D571 Sickle-cell disease without crisis: Secondary | ICD-10-CM

## 2015-03-05 LAB — POCT URINALYSIS DIP (DEVICE)
Bilirubin Urine: NEGATIVE
Glucose, UA: NEGATIVE mg/dL
Hgb urine dipstick: NEGATIVE
KETONES UR: NEGATIVE mg/dL
Leukocytes, UA: NEGATIVE
Nitrite: NEGATIVE
PH: 6.5 (ref 5.0–8.0)
PROTEIN: NEGATIVE mg/dL
SPECIFIC GRAVITY, URINE: 1.015 (ref 1.005–1.030)
UROBILINOGEN UA: 1 mg/dL (ref 0.0–1.0)

## 2015-03-05 NOTE — Progress Notes (Signed)
Subjective:  Taylor Roman is a 35 y.o. [id number] at [redacted]w[redacted]d being seen today for ongoing prenatal care.  She is currently monitored for the following issues for this high-risk pregnancy and has Sickle cell disease (HCC); High risk HPV infection; LGSIL (low grade squamous intraepithelial dysplasia); Supervision of high risk pregnancy, antepartum; Advanced maternal age in multigravida; and Maternal sickle cell anemia affecting pregnancy, antepartum (HCC) on her problem list.  Patient reports no complaints.  Contractions: Irregular. Vag. Bleeding: None.  Movement: Present. Denies leaking of fluid.   The following portions of the patient's history were reviewed and updated as appropriate: allergies, current medications, past family history, past medical history, past social history, past surgical history and problem list. Problem list updated.  Objective:   Filed Vitals:   03/05/15 0918  BP: 126/65  Pulse: 79  Temp: 98 F (36.7 C)  Weight: 228 lb 12.8 oz (103.783 kg)    Fetal Status: Fetal Heart Rate (bpm): 150   Movement: Present     General:  Alert, oriented and cooperative. Patient is in no acute distress.  Skin: Skin is warm and dry. No rash noted.   Cardiovascular: Normal heart rate noted  Respiratory: Normal respiratory effort, no problems with respiration noted  Abdomen: Soft, gravid, appropriate for gestational age. Pain/Pressure: Present     Pelvic: Vag. Bleeding: None     Cervical exam deferred        Extremities: Normal range of motion.  Edema: None  Mental Status: Normal mood and affect. Normal behavior. Normal judgment and thought content.   Urinalysis:      Assessment and Plan:  Pregnancy: [id number] at [redacted]w[redacted]d  # SCC - monthly labs due next visit, u/s for growth 3/17  Preterm labor symptoms and general obstetric precautions including but not limited to vaginal bleeding, contractions, leaking of fluid and fetal movement were reviewed in detail with the patient. Please refer to  After Visit Summary for other counseling recommendations.   F/u 4 weeks  Kathrynn Running, MD

## 2015-03-05 NOTE — Progress Notes (Signed)
Pt reports having contractions and some discharge.

## 2015-03-19 ENCOUNTER — Encounter: Payer: Self-pay | Admitting: Family

## 2015-03-20 ENCOUNTER — Other Ambulatory Visit (HOSPITAL_COMMUNITY): Payer: Self-pay | Admitting: Obstetrics and Gynecology

## 2015-03-20 ENCOUNTER — Encounter (HOSPITAL_COMMUNITY): Payer: Self-pay

## 2015-03-20 ENCOUNTER — Ambulatory Visit (HOSPITAL_COMMUNITY)
Admission: RE | Admit: 2015-03-20 | Discharge: 2015-03-20 | Disposition: A | Discharge status: Home / Self Care | Payer: Self-pay | Source: Ambulatory Visit | Attending: Family | Admitting: Family

## 2015-03-20 DIAGNOSIS — Z3A26 26 weeks gestation of pregnancy: Secondary | ICD-10-CM

## 2015-03-20 DIAGNOSIS — O99012 Anemia complicating pregnancy, second trimester: Secondary | ICD-10-CM | POA: Insufficient documentation

## 2015-03-20 DIAGNOSIS — D571 Sickle-cell disease without crisis: Secondary | ICD-10-CM

## 2015-03-20 DIAGNOSIS — O09522 Supervision of elderly multigravida, second trimester: Secondary | ICD-10-CM | POA: Insufficient documentation

## 2015-03-20 DIAGNOSIS — O99019 Anemia complicating pregnancy, unspecified trimester: Secondary | ICD-10-CM

## 2015-03-23 ENCOUNTER — Ambulatory Visit (INDEPENDENT_AMBULATORY_CARE_PROVIDER_SITE_OTHER): Payer: Self-pay | Admitting: Family Medicine

## 2015-03-23 VITALS — BP 109/66 | HR 66 | Temp 98.4°F | Wt 233.8 lb

## 2015-03-23 DIAGNOSIS — Z23 Encounter for immunization: Secondary | ICD-10-CM

## 2015-03-23 DIAGNOSIS — O09522 Supervision of elderly multigravida, second trimester: Secondary | ICD-10-CM

## 2015-03-23 DIAGNOSIS — O99012 Anemia complicating pregnancy, second trimester: Secondary | ICD-10-CM

## 2015-03-23 DIAGNOSIS — D571 Sickle-cell disease without crisis: Secondary | ICD-10-CM

## 2015-03-23 DIAGNOSIS — O0992 Supervision of high risk pregnancy, unspecified, second trimester: Secondary | ICD-10-CM

## 2015-03-23 DIAGNOSIS — O99019 Anemia complicating pregnancy, unspecified trimester: Secondary | ICD-10-CM

## 2015-03-23 LAB — POCT URINALYSIS DIP (DEVICE)
Bilirubin Urine: NEGATIVE
Glucose, UA: NEGATIVE mg/dL
Hgb urine dipstick: NEGATIVE
Ketones, ur: NEGATIVE mg/dL
Leukocytes, UA: NEGATIVE
NITRITE: NEGATIVE
PH: 6 (ref 5.0–8.0)
PROTEIN: NEGATIVE mg/dL
Specific Gravity, Urine: 1.015 (ref 1.005–1.030)
Urobilinogen, UA: 1 mg/dL (ref 0.0–1.0)

## 2015-03-23 NOTE — Progress Notes (Signed)
Subjective:  Taylor Roman is a 35 y.o. [id number] at 5994w0d being seen today for ongoing prenatal care.  She is currently monitored for the following issues for this high-risk pregnancy and has Sickle cell disease (HCC); High risk HPV infection; LGSIL (low grade squamous intraepithelial dysplasia); Supervision of high risk pregnancy, antepartum; Advanced maternal age in multigravida; and Maternal sickle cell anemia affecting pregnancy, antepartum (HCC) on her problem list.  Patient reports no complaints.  Contractions: Irregular. Vag. Bleeding: None.  Movement: Present. Denies leaking of fluid.   The following portions of the patient's history were reviewed and updated as appropriate: allergies, current medications, past family history, past medical history, past social history, past surgical history and problem list. Problem list updated.  Objective:   Filed Vitals:   03/23/15 1029  BP: 109/66  Pulse: 66  Temp: 98.4 F (36.9 C)  Weight: 233 lb 12.8 oz (106.051 kg)    Fetal Status: Fetal Heart Rate (bpm): 150 Fundal Height: 27 cm Movement: Present     General:  Alert, oriented and cooperative. Patient is in no acute distress.  Skin: Skin is warm and dry. No rash noted.   Cardiovascular: Normal heart rate noted  Respiratory: Normal respiratory effort, no problems with respiration noted  Abdomen: Soft, gravid, appropriate for gestational age. Pain/Pressure: Present     Pelvic: Vag. Bleeding: None     Cervical exam deferred        Extremities: Normal range of motion.     Mental Status: Normal mood and affect. Normal behavior. Normal judgment and thought content.   Urinalysis:    Prot neg, gluc neg U/s 3/17 vtx, AFI 17.05, EFW 2 lb 7 oz (76%) Assessment and Plan:  Pregnancy: [id number] at 2594w0d  1. Need for Tdap vaccination given - Tdap vaccine greater than or equal to 7yo IM  2. Supervision of high risk pregnancy, antepartum, second trimester Continue routine prenatal care. Left  without getting 28 wk labs today--please repeat next visit.  3. Advanced maternal age in multigravida, second trimester - U/A NO MICRO (81003)  4. Maternal sickle cell anemia affecting pregnancy, antepartum (HCC) No crises, serial u/s for growth  Preterm labor symptoms and general obstetric precautions including but not limited to vaginal bleeding, contractions, leaking of fluid and fetal movement were reviewed in detail with the patient. Please refer to After Visit Summary for other counseling recommendations.  Return in 2 weeks (on 04/06/2015).   Reva Boresanya S Shannelle Alguire, MD

## 2015-03-23 NOTE — Patient Instructions (Signed)
Breastfeeding Deciding to breastfeed is one of the best choices you can make for you and your baby. A change in hormones during pregnancy causes your breast tissue to grow and increases the number and size of your milk ducts. These hormones also allow proteins, sugars, and fats from your blood supply to make breast milk in your milk-producing glands. Hormones prevent breast milk from being released before your baby is born as well as prompt milk flow after birth. Once breastfeeding has begun, thoughts of your baby, as well as his or her sucking or crying, can stimulate the release of milk from your milk-producing glands.  BENEFITS OF BREASTFEEDING For Your Baby  Your first milk (colostrum) helps your baby's digestive system function better.  There are antibodies in your milk that help your baby fight off infections.  Your baby has a lower incidence of asthma, allergies, and sudden infant death syndrome.  The nutrients in breast milk are better for your baby than infant formulas and are designed uniquely for your baby's needs.  Breast milk improves your baby's brain development.  Your baby is less likely to develop other conditions, such as childhood obesity, asthma, or type 2 diabetes mellitus. For You  Breastfeeding helps to create a very special bond between you and your baby.  Breastfeeding is convenient. Breast milk is always available at the correct temperature and costs nothing.  Breastfeeding helps to burn calories and helps you lose the weight gained during pregnancy.  Breastfeeding makes your uterus contract to its prepregnancy size faster and slows bleeding (lochia) after you give birth.   Breastfeeding helps to lower your risk of developing type 2 diabetes mellitus, osteoporosis, and breast or ovarian cancer later in life. SIGNS THAT YOUR BABY IS HUNGRY Early Signs of Hunger  Increased alertness or activity.  Stretching.  Movement of the head from side to  side.  Movement of the head and opening of the mouth when the corner of the mouth or cheek is stroked (rooting).  Increased sucking sounds, smacking lips, cooing, sighing, or squeaking.  Hand-to-mouth movements.  Increased sucking of fingers or hands. Late Signs of Hunger  Fussing.  Intermittent crying. Extreme Signs of Hunger Signs of extreme hunger will require calming and consoling before your baby will be able to breastfeed successfully. Do not wait for the following signs of extreme hunger to occur before you initiate breastfeeding:  Restlessness.  A loud, strong cry.  Screaming. BREASTFEEDING BASICS Breastfeeding Initiation  Find a comfortable place to sit or lie down, with your neck and back well supported.  Place a pillow or rolled up blanket under your baby to bring him or her to the level of your breast (if you are seated). Nursing pillows are specially designed to help support your arms and your baby while you breastfeed.  Make sure that your baby's abdomen is facing your abdomen.  Gently massage your breast. With your fingertips, massage from your chest wall toward your nipple in a circular motion. This encourages milk flow. You may need to continue this action during the feeding if your milk flows slowly.  Support your breast with 4 fingers underneath and your thumb above your nipple. Make sure your fingers are well away from your nipple and your baby's mouth.  Stroke your baby's lips gently with your finger or nipple.  When your baby's mouth is open wide enough, quickly bring your baby to your breast, placing your entire nipple and as much of the colored area around your nipple (  areola) as possible into your baby's mouth.  More areola should be visible above your baby's upper lip than below the lower lip.  Your baby's tongue should be between his or her lower gum and your breast.  Ensure that your baby's mouth is correctly positioned around your nipple  (latched). Your baby's lips should create a seal on your breast and be turned out (everted).  It is common for your baby to suck about 2-3 minutes in order to start the flow of breast milk. Latching Teaching your baby how to latch on to your breast properly is very important. An improper latch can cause nipple pain and decreased milk supply for you and poor weight gain in your baby. Also, if your baby is not latched onto your nipple properly, he or she may swallow some air during feeding. This can make your baby fussy. Burping your baby when you switch breasts during the feeding can help to get rid of the air. However, teaching your baby to latch on properly is still the best way to prevent fussiness from swallowing air while breastfeeding. Signs that your baby has successfully latched on to your nipple:  Silent tugging or silent sucking, without causing you pain.  Swallowing heard between every 3-4 sucks.  Muscle movement above and in front of his or her ears while sucking. Signs that your baby has not successfully latched on to nipple:  Sucking sounds or smacking sounds from your baby while breastfeeding.  Nipple pain. If you think your baby has not latched on correctly, slip your finger into the corner of your baby's mouth to break the suction and place it between your baby's gums. Attempt breastfeeding initiation again. Signs of Successful Breastfeeding Signs from your baby:  A gradual decrease in the number of sucks or complete cessation of sucking.  Falling asleep.  Relaxation of his or her body.  Retention of a small amount of milk in his or her mouth.  Letting go of your breast by himself or herself. Signs from you:  Breasts that have increased in firmness, weight, and size 1-3 hours after feeding.  Breasts that are softer immediately after breastfeeding.  Increased milk volume, as well as a change in milk consistency and color by the fifth day of breastfeeding.  Nipples  that are not sore, cracked, or bleeding. Signs That Your Baby is Getting Enough Milk  Wetting at least 3 diapers in a 24-hour period. The urine should be clear and pale yellow by age 5 days.  At least 3 stools in a 24-hour period by age 5 days. The stool should be soft and yellow.  At least 3 stools in a 24-hour period by age 7 days. The stool should be seedy and yellow.  No loss of weight greater than 10% of birth weight during the first 3 days of age.  Average weight gain of 4-7 ounces (113-198 g) per week after age 4 days.  Consistent daily weight gain by age 5 days, without weight loss after the age of 2 weeks. After a feeding, your baby may spit up a small amount. This is common. BREASTFEEDING FREQUENCY AND DURATION Frequent feeding will help you make more milk and can prevent sore nipples and breast engorgement. Breastfeed when you feel the need to reduce the fullness of your breasts or when your baby shows signs of hunger. This is called "breastfeeding on demand." Avoid introducing a pacifier to your baby while you are working to establish breastfeeding (the first 4-6 weeks   after your baby is born). After this time you may choose to use a pacifier. Research has shown that pacifier use during the first year of a baby's life decreases the risk of sudden infant death syndrome (SIDS). Allow your baby to feed on each breast as long as he or she wants. Breastfeed until your baby is finished feeding. When your baby unlatches or falls asleep while feeding from the first breast, offer the second breast. Because newborns are often sleepy in the first few weeks of life, you may need to awaken your baby to get him or her to feed. Breastfeeding times will vary from baby to baby. However, the following rules can serve as a guide to help you ensure that your baby is properly fed:  Newborns (babies 4 weeks of age or younger) may breastfeed every 1-3 hours.  Newborns should not go longer than 3 hours  during the day or 5 hours during the night without breastfeeding.  You should breastfeed your baby a minimum of 8 times in a 24-hour period until you begin to introduce solid foods to your baby at around 6 months of age. BREAST MILK PUMPING Pumping and storing breast milk allows you to ensure that your baby is exclusively fed your breast milk, even at times when you are unable to breastfeed. This is especially important if you are going back to work while you are still breastfeeding or when you are not able to be present during feedings. Your lactation consultant can give you guidelines on how long it is safe to store breast milk. A breast pump is a machine that allows you to pump milk from your breast into a sterile bottle. The pumped breast milk can then be stored in a refrigerator or freezer. Some breast pumps are operated by hand, while others use electricity. Ask your lactation consultant which type will work best for you. Breast pumps can be purchased, but some hospitals and breastfeeding support groups lease breast pumps on a monthly basis. A lactation consultant can teach you how to hand express breast milk, if you prefer not to use a pump. CARING FOR YOUR BREASTS WHILE YOU BREASTFEED Nipples can become dry, cracked, and sore while breastfeeding. The following recommendations can help keep your breasts moisturized and healthy:  Avoid using soap on your nipples.  Wear a supportive bra. Although not required, special nursing bras and tank tops are designed to allow access to your breasts for breastfeeding without taking off your entire bra or top. Avoid wearing underwire-style bras or extremely tight bras.  Air dry your nipples for 3-4minutes after each feeding.  Use only cotton bra pads to absorb leaked breast milk. Leaking of breast milk between feedings is normal.  Use lanolin on your nipples after breastfeeding. Lanolin helps to maintain your skin's normal moisture barrier. If you use  pure lanolin, you do not need to wash it off before feeding your baby again. Pure lanolin is not toxic to your baby. You may also hand express a few drops of breast milk and gently massage that milk into your nipples and allow the milk to air dry. In the first few weeks after giving birth, some women experience extremely full breasts (engorgement). Engorgement can make your breasts feel heavy, warm, and tender to the touch. Engorgement peaks within 3-5 days after you give birth. The following recommendations can help ease engorgement:  Completely empty your breasts while breastfeeding or pumping. You may want to start by applying warm, moist heat (in   the shower or with warm water-soaked hand towels) just before feeding or pumping. This increases circulation and helps the milk flow. If your baby does not completely empty your breasts while breastfeeding, pump any extra milk after he or she is finished.  Wear a snug bra (nursing or regular) or tank top for 1-2 days to signal your body to slightly decrease milk production.  Apply ice packs to your breasts, unless this is too uncomfortable for you.  Make sure that your baby is latched on and positioned properly while breastfeeding. If engorgement persists after 48 hours of following these recommendations, contact your health care provider or a lactation consultant. OVERALL HEALTH CARE RECOMMENDATIONS WHILE BREASTFEEDING  Eat healthy foods. Alternate between meals and snacks, eating 3 of each per day. Because what you eat affects your breast milk, some of the foods may make your baby more irritable than usual. Avoid eating these foods if you are sure that they are negatively affecting your baby.  Drink milk, fruit juice, and water to satisfy your thirst (about 10 glasses a day).  Rest often, relax, and continue to take your prenatal vitamins to prevent fatigue, stress, and anemia.  Continue breast self-awareness checks.  Avoid chewing and smoking  tobacco. Chemicals from cigarettes that pass into breast milk and exposure to secondhand smoke may harm your baby.  Avoid alcohol and drug use, including marijuana. Some medicines that may be harmful to your baby can pass through breast milk. It is important to ask your health care provider before taking any medicine, including all over-the-counter and prescription medicine as well as vitamin and herbal supplements. It is possible to become pregnant while breastfeeding. If birth control is desired, ask your health care provider about options that will be safe for your baby. SEEK MEDICAL CARE IF:  You feel like you want to stop breastfeeding or have become frustrated with breastfeeding.  You have painful breasts or nipples.  Your nipples are cracked or bleeding.  Your breasts are red, tender, or warm.  You have a swollen area on either breast.  You have a fever or chills.  You have nausea or vomiting.  You have drainage other than breast milk from your nipples.  Your breasts do not become full before feedings by the fifth day after you give birth.  You feel sad and depressed.  Your baby is too sleepy to eat well.  Your baby is having trouble sleeping.   Your baby is wetting less than 3 diapers in a 24-hour period.  Your baby has less than 3 stools in a 24-hour period.  Your baby's skin or the white part of his or her eyes becomes yellow.   Your baby is not gaining weight by 5 days of age. SEEK IMMEDIATE MEDICAL CARE IF:  Your baby is overly tired (lethargic) and does not want to wake up and feed.  Your baby develops an unexplained fever.   This information is not intended to replace advice given to you by your health care provider. Make sure you discuss any questions you have with your health care provider.   Document Released: 12/20/2004 Document Revised: 09/10/2014 Document Reviewed: 06/13/2012 Elsevier Interactive Patient Education 2016 [employer]  

## 2015-04-06 ENCOUNTER — Ambulatory Visit (INDEPENDENT_AMBULATORY_CARE_PROVIDER_SITE_OTHER): Payer: Self-pay | Admitting: Obstetrics & Gynecology

## 2015-04-06 VITALS — BP 127/69 | HR 83 | Temp 98.3°F | Wt 233.0 lb

## 2015-04-06 DIAGNOSIS — O0993 Supervision of high risk pregnancy, unspecified, third trimester: Secondary | ICD-10-CM

## 2015-04-06 DIAGNOSIS — O99013 Anemia complicating pregnancy, third trimester: Secondary | ICD-10-CM

## 2015-04-06 DIAGNOSIS — D649 Anemia, unspecified: Secondary | ICD-10-CM

## 2015-04-06 DIAGNOSIS — O99019 Anemia complicating pregnancy, unspecified trimester: Principal | ICD-10-CM

## 2015-04-06 DIAGNOSIS — D571 Sickle-cell disease without crisis: Secondary | ICD-10-CM

## 2015-04-06 DIAGNOSIS — O09523 Supervision of elderly multigravida, third trimester: Secondary | ICD-10-CM

## 2015-04-06 LAB — RETICULOCYTES
ABS Retic: 175760 cells/uL — ABNORMAL HIGH (ref 20000–80000)
RBC.: 3.38 MIL/uL — ABNORMAL LOW (ref 3.80–5.10)
Retic Ct Pct: 5.2 %

## 2015-04-06 LAB — CBC
HCT: 28.4 % — ABNORMAL LOW (ref 35.0–45.0)
Hemoglobin: 9.1 g/dL — ABNORMAL LOW (ref 11.7–15.5)
MCH: 26.9 pg — AB (ref 27.0–33.0)
MCHC: 32 g/dL (ref 32.0–36.0)
MCV: 84 fL (ref 80.0–100.0)
MPV: 9.2 fL (ref 7.5–12.5)
PLATELETS: 236 10*3/uL (ref 140–400)
RBC: 3.38 MIL/uL — AB (ref 3.80–5.10)
RDW: 20.4 % — AB (ref 11.0–15.0)
WBC: 7.5 10*3/uL (ref 3.8–10.8)

## 2015-04-06 LAB — POCT URINALYSIS DIP (DEVICE)
Bilirubin Urine: NEGATIVE
GLUCOSE, UA: NEGATIVE mg/dL
Hgb urine dipstick: NEGATIVE
Ketones, ur: NEGATIVE mg/dL
NITRITE: NEGATIVE
PROTEIN: NEGATIVE mg/dL
Specific Gravity, Urine: 1.015 (ref 1.005–1.030)
UROBILINOGEN UA: 1 mg/dL (ref 0.0–1.0)
pH: 6 (ref 5.0–8.0)

## 2015-04-06 NOTE — Progress Notes (Signed)
28 wk labs today 

## 2015-04-06 NOTE — Patient Instructions (Signed)
Return to clinic for any scheduled appointments or obstetric concerns, or go to [facility] for evaluation  

## 2015-04-06 NOTE — Progress Notes (Signed)
Subjective:  Taylor Roman is a 35 y.o. [id number] at 3081w0d being seen today for ongoing prenatal care.  She is currently monitored for the following issues for this high-risk pregnancy and has Sickle cell disease (HCC); High risk HPV infection; LGSIL (low grade squamous intraepithelial dysplasia); Supervision of high risk pregnancy, antepartum; Advanced maternal age in multigravida; and Maternal sickle cell anemia affecting pregnancy, antepartum (HCC) on her problem list.  Patient reports no complaints.  Contractions: Irregular. Vag. Bleeding: None.  Movement: Present. Denies leaking of fluid.   The following portions of the patient's history were reviewed and updated as appropriate: allergies, current medications, past family history, past medical history, past social history, past surgical history and problem list. Problem list updated.  Objective:   Filed Vitals:   04/06/15 1011  BP: 127/69  Pulse: 83  Temp: 98.3 F (36.8 C)  Weight: 233 lb (105.688 kg)    Fetal Status: Fetal Heart Rate (bpm): 157 Fundal Height: 30 cm Movement: Present     General:  Alert, oriented and cooperative. Patient is in no acute distress.  Skin: Skin is warm and dry. No rash noted.   Cardiovascular: Normal heart rate noted  Respiratory: Normal respiratory effort, no problems with respiration noted  Abdomen: Soft, gravid, appropriate for gestational age. Pain/Pressure: Present     Pelvic: Vag. Bleeding: None     Cervical exam deferred        Extremities: Normal range of motion.  Edema: None  Mental Status: Normal mood and affect. Normal behavior. Normal judgment and thought content.   Urinalysis: Urine Protein: Negative Urine Glucose: Negative  Assessment and Plan:  Pregnancy: [id number] at 8981w0d  1. Maternal sickle cell anemia affecting pregnancy, antepartum (HCC) Stable, no crises.  Growth scan on 4/14.  Third trimester labs today. - POCT urinalysis dip (device) - Glucose Tolerance, 1 HR (50g) w/o  Fasting - RPR - HIV antibody (with reflex) - CBC - Retic  2. Supervision of high risk pregnancy, antepartum, third trimester Preterm labor symptoms and general obstetric precautions including but not limited to vaginal bleeding, contractions, leaking of fluid and fetal movement were reviewed in detail with the patient. Please refer to After Visit Summary for other counseling recommendations.  Return in about 2 weeks (around 04/20/2015) for OB Visit.   Tereso NewcomerUgonna A Anyanwu, MD

## 2015-04-07 LAB — GLUCOSE TOLERANCE, 1 HOUR (50G) W/O FASTING: GLUCOSE, 1 HR, GESTATIONAL: 113 mg/dL (ref <140)

## 2015-04-07 LAB — HIV ANTIBODY (ROUTINE TESTING W REFLEX): HIV: NONREACTIVE

## 2015-04-08 LAB — RPR

## 2015-04-17 ENCOUNTER — Encounter (HOSPITAL_COMMUNITY): Payer: Self-pay

## 2015-04-17 ENCOUNTER — Ambulatory Visit (HOSPITAL_COMMUNITY)
Admission: RE | Admit: 2015-04-17 | Discharge: 2015-04-17 | Disposition: A | Discharge status: Home / Self Care | Payer: Self-pay | Source: Ambulatory Visit | Attending: Family | Admitting: Family

## 2015-04-17 ENCOUNTER — Other Ambulatory Visit (HOSPITAL_COMMUNITY): Payer: Self-pay | Admitting: Obstetrics and Gynecology

## 2015-04-17 DIAGNOSIS — O99213 Obesity complicating pregnancy, third trimester: Secondary | ICD-10-CM

## 2015-04-17 DIAGNOSIS — D571 Sickle-cell disease without crisis: Secondary | ICD-10-CM

## 2015-04-17 DIAGNOSIS — O99013 Anemia complicating pregnancy, third trimester: Secondary | ICD-10-CM | POA: Insufficient documentation

## 2015-04-17 DIAGNOSIS — Z3A30 30 weeks gestation of pregnancy: Secondary | ICD-10-CM

## 2015-04-17 DIAGNOSIS — O09523 Supervision of elderly multigravida, third trimester: Secondary | ICD-10-CM

## 2015-04-17 DIAGNOSIS — O99019 Anemia complicating pregnancy, unspecified trimester: Principal | ICD-10-CM

## 2015-04-20 ENCOUNTER — Ambulatory Visit (INDEPENDENT_AMBULATORY_CARE_PROVIDER_SITE_OTHER): Payer: Self-pay | Admitting: Obstetrics & Gynecology

## 2015-04-20 VITALS — BP 121/61 | HR 86 | Temp 98.0°F | Wt 236.0 lb

## 2015-04-20 DIAGNOSIS — D571 Sickle-cell disease without crisis: Secondary | ICD-10-CM

## 2015-04-20 DIAGNOSIS — O99019 Anemia complicating pregnancy, unspecified trimester: Secondary | ICD-10-CM

## 2015-04-20 DIAGNOSIS — O09523 Supervision of elderly multigravida, third trimester: Secondary | ICD-10-CM

## 2015-04-20 LAB — POCT URINALYSIS DIP (DEVICE)
Bilirubin Urine: NEGATIVE
GLUCOSE, UA: NEGATIVE mg/dL
HGB URINE DIPSTICK: NEGATIVE
Ketones, ur: NEGATIVE mg/dL
Leukocytes, UA: NEGATIVE
Nitrite: NEGATIVE
PROTEIN: NEGATIVE mg/dL
SPECIFIC GRAVITY, URINE: 1.015 (ref 1.005–1.030)
UROBILINOGEN UA: 1 mg/dL (ref 0.0–1.0)
pH: 6 (ref 5.0–8.0)

## 2015-04-20 MED ORDER — FERROUS SULFATE 325 (65 FE) MG PO TABS: 325.0000 mg | ORAL_TABLET | Freq: Two times a day (BID) | ORAL | Status: DC

## 2015-04-20 NOTE — Progress Notes (Signed)
Patient c/o heart palpitations off and on, feels like heart is speeding up, feels weak when this happens Breastfeeding tip of the week reviewed

## 2015-04-20 NOTE — Patient Instructions (Signed)

## 2015-04-20 NOTE — Progress Notes (Signed)
Gets rapid heartrate and weakness for a few minutes almost daily Subjective:  Taylor Roman is a 35 y.o. [id number] at 7511w0d being seen today for ongoing prenatal care.  She is currently monitored for the following issues for this high-risk pregnancy and has Sickle cell disease (HCC); High risk HPV infection; LGSIL (low grade squamous intraepithelial dysplasia); Supervision of high risk pregnancy, antepartum; Advanced maternal age in multigravida; and Maternal sickle cell anemia affecting pregnancy, antepartum (HCC) on her problem list.  Patient reports palpitation.  Contractions: Irregular. Vag. Bleeding: None.  Movement: Present. Denies leaking of fluid.   The following portions of the patient's history were reviewed and updated as appropriate: allergies, current medications, past family history, past medical history, past social history, past surgical history and problem list. Problem list updated.  Objective:   Filed Vitals:   04/20/15 0956  BP: 121/61  Pulse: 86  Temp: 98 F (36.7 C)  Weight: 236 lb (107.049 kg)    Fetal Status: Fetal Heart Rate (bpm): 150 Fundal Height: 32 cm Movement: Present     General:  Alert, oriented and cooperative. Patient is in no acute distress.  Skin: Skin is warm and dry. No rash noted.   Cardiovascular: Normal heart rate noted  Respiratory: Normal respiratory effort, no problems with respiration noted  Abdomen: Soft, gravid, appropriate for gestational age. Pain/Pressure: Present     Pelvic: Vag. Bleeding: None     Cervical exam deferred        Extremities: Normal range of motion.  Edema: None  Mental Status: Normal mood and affect. Normal behavior. Normal judgment and thought content.   Urinalysis: Urine Protein: Negative Urine Glucose: Negative  Assessment and Plan:  Pregnancy: [id number] at 4411w0d  1. Advanced maternal age in multigravida, third trimester Needs to be on iron for anemia  2. Maternal sickle cell anemia affecting pregnancy,  antepartum (HCC) Fe BID, sx noted may be from anemia CBC    Component Value Date/Time   WBC 7.5 04/06/2015 1109   RBC 3.38* 04/06/2015 1109   RBC 3.38* 04/06/2015 1109   HGB 9.1* 04/06/2015 1109   HCT 28.4* 04/06/2015 1109   PLT 236 04/06/2015 1109   MCV 84.0 04/06/2015 1109   MCH 26.9* 04/06/2015 1109   MCHC 32.0 04/06/2015 1109   RDW 20.4* 04/06/2015 1109   LYMPHSABS 1.3 07/06/2011 0943   MONOABS 0.2 07/06/2011 0943   EOSABS 0.1 07/06/2011 0943   BASOSABS 0.0 07/06/2011 0943      Preterm labor symptoms and general obstetric precautions including but not limited to vaginal bleeding, contractions, leaking of fluid and fetal movement were reviewed in detail with the patient. Please refer to After Visit Summary for other counseling recommendations.  Return in about 2 weeks (around 05/04/2015).   Adam PhenixJames G Amol Domanski, MD

## 2015-05-04 ENCOUNTER — Ambulatory Visit (INDEPENDENT_AMBULATORY_CARE_PROVIDER_SITE_OTHER): Payer: Self-pay | Admitting: Advanced Practice Midwife

## 2015-05-04 ENCOUNTER — Encounter: Payer: Self-pay | Admitting: Advanced Practice Midwife

## 2015-05-04 VITALS — BP 126/71 | HR 94 | Wt 235.2 lb

## 2015-05-04 DIAGNOSIS — Z9109 Other allergy status, other than to drugs and biological substances: Secondary | ICD-10-CM

## 2015-05-04 DIAGNOSIS — D573 Sickle-cell trait: Secondary | ICD-10-CM

## 2015-05-04 DIAGNOSIS — O99013 Anemia complicating pregnancy, third trimester: Secondary | ICD-10-CM

## 2015-05-04 DIAGNOSIS — O09523 Supervision of elderly multigravida, third trimester: Secondary | ICD-10-CM

## 2015-05-04 LAB — POCT URINALYSIS DIP (DEVICE)
BILIRUBIN URINE: NEGATIVE
Glucose, UA: NEGATIVE mg/dL
Hgb urine dipstick: NEGATIVE
Ketones, ur: NEGATIVE mg/dL
LEUKOCYTES UA: NEGATIVE
NITRITE: NEGATIVE
Protein, ur: NEGATIVE mg/dL
Specific Gravity, Urine: 1.015 (ref 1.005–1.030)
Urobilinogen, UA: 1 mg/dL (ref 0.0–1.0)
pH: 6.5 (ref 5.0–8.0)

## 2015-05-04 LAB — RETICULOCYTES
ABS RETIC: 151340 {cells}/uL — AB (ref 20000–80000)
RBC.: 3.22 MIL/uL — ABNORMAL LOW (ref 3.80–5.10)
RETIC CT PCT: 4.7 %

## 2015-05-04 NOTE — Patient Instructions (Signed)

## 2015-05-04 NOTE — Progress Notes (Signed)
Subjective:  Taylor Roman is a 35 y.o. [id number] at 7349w0d being seen today for ongoing prenatal care.  She is currently monitored for the following issues for this high-risk pregnancy and has Sickle cell disease (HCC); High risk HPV infection; LGSIL (low grade squamous intraepithelial dysplasia); Supervision of high risk pregnancy, antepartum; Advanced maternal age in multigravida; and Maternal sickle cell anemia affecting pregnancy, antepartum (HCC) on her problem list.  Patient reports Eyes itching, congestion..  Contractions: Irregular. Vag. Bleeding: None.  Movement: Present. Denies leaking of fluid.   The following portions of the patient's history were reviewed and updated as appropriate: allergies, current medications, past family history, past medical history, past social history, past surgical history and problem list. Problem list updated.  Objective:   Filed Vitals:   05/04/15 1056  BP: 126/71  Pulse: 94  Weight: 106.686 kg (235 lb 3.2 oz)    Fetal Status: Fetal Heart Rate (bpm): 142   Movement: Present     General:  Alert, oriented and cooperative. Patient is in no acute distress.  Skin: Skin is warm and dry. No rash noted.   Cardiovascular: Normal heart rate noted  Respiratory: Normal respiratory effort, no problems with respiration noted  Abdomen: Soft, gravid, appropriate for gestational age. Pain/Pressure: Present     Pelvic: Vag. Bleeding: None     Cervical exam deferred        Extremities: Normal range of motion.  Edema: None  Mental Status: Normal mood and affect. Normal behavior. Normal judgment and thought content.   Urinalysis: Urine Protein: Negative Urine Glucose: Negative  Assessment and Plan:  Pregnancy: [id number] at 4049w0d  1. Advanced maternal age in multigravida, third trimester     Monthly CBC and Retic - CBC - Reticulocytes  Probable Allergies    Already on Zyrtec     Has saline spray  Preterm labor symptoms and general obstetric precautions  including but not limited to vaginal bleeding, contractions, leaking of fluid and fetal movement were reviewed in detail with the patient. Please refer to After Visit Summary for other counseling recommendations.   [facility]  Aviva SignsMarie L Kaytlynne Neace, [place]

## 2015-05-15 ENCOUNTER — Ambulatory Visit (HOSPITAL_COMMUNITY): Admission: RE | Admit: 2015-05-15 | Payer: Self-pay | Source: Ambulatory Visit

## 2015-05-18 ENCOUNTER — Ambulatory Visit (INDEPENDENT_AMBULATORY_CARE_PROVIDER_SITE_OTHER): Payer: Self-pay | Admitting: Family Medicine

## 2015-05-18 VITALS — BP 141/71 | HR 77 | Temp 98.5°F | Wt 239.2 lb

## 2015-05-18 DIAGNOSIS — D571 Sickle-cell disease without crisis: Secondary | ICD-10-CM

## 2015-05-18 DIAGNOSIS — O99013 Anemia complicating pregnancy, third trimester: Secondary | ICD-10-CM

## 2015-05-18 DIAGNOSIS — O0993 Supervision of high risk pregnancy, unspecified, third trimester: Secondary | ICD-10-CM

## 2015-05-18 DIAGNOSIS — O99019 Anemia complicating pregnancy, unspecified trimester: Secondary | ICD-10-CM

## 2015-05-18 DIAGNOSIS — O09523 Supervision of elderly multigravida, third trimester: Secondary | ICD-10-CM

## 2015-05-18 DIAGNOSIS — Z113 Encounter for screening for infections with a predominantly sexual mode of transmission: Secondary | ICD-10-CM

## 2015-05-18 LAB — POCT URINALYSIS DIP (DEVICE)
BILIRUBIN URINE: NEGATIVE
GLUCOSE, UA: NEGATIVE mg/dL
Hgb urine dipstick: NEGATIVE
KETONES UR: NEGATIVE mg/dL
Nitrite: NEGATIVE
PH: 6.5 (ref 5.0–8.0)
PROTEIN: NEGATIVE mg/dL
SPECIFIC GRAVITY, URINE: 1.015 (ref 1.005–1.030)
Urobilinogen, UA: 1 mg/dL (ref 0.0–1.0)

## 2015-05-18 LAB — OB RESULTS CONSOLE GBS: GBS: NEGATIVE

## 2015-05-18 NOTE — Progress Notes (Signed)
Pt rescheduled for U/S 05/24 @ 330pm.

## 2015-05-18 NOTE — Patient Instructions (Addendum)
Third Trimester of Pregnancy The third trimester is from week 29 through week 42, months 7 through 9. The third trimester is a time when the fetus is growing rapidly. At the end of the ninth month, the fetus is about 20 inches in length and weighs 6-10 pounds.  BODY CHANGES Your body goes through many changes during pregnancy. The changes vary from woman to woman.   Your weight will continue to increase. You can expect to gain 25-35 pounds (11-16 kg) by the end of the pregnancy.  You may begin to get stretch marks on your hips, abdomen, and breasts.  You may urinate more often because the fetus is moving lower into your pelvis and pressing on your bladder.  You may develop or continue to have heartburn as a result of your pregnancy.  You may develop constipation because certain hormones are causing the muscles that push waste through your intestines to slow down.  You may develop hemorrhoids or swollen, bulging veins (varicose veins).  You may have pelvic pain because of the weight gain and pregnancy hormones relaxing your joints between the bones in your pelvis. Backaches may result from overexertion of the muscles supporting your posture.  You may have changes in your hair. These can include thickening of your hair, rapid growth, and changes in texture. Some women also have hair loss during or after pregnancy, or hair that feels dry or thin. Your hair will most likely return to normal after your baby is born.  Your breasts will continue to grow and be tender. A yellow discharge may leak from your breasts called colostrum.  Your belly button may stick out.  You may feel short of breath because of your expanding uterus.  You may notice the fetus "dropping," or moving lower in your abdomen.  You may have a bloody mucus discharge. This usually occurs a few days to a week before labor begins.  Your cervix becomes thin and soft (effaced) near your due date. WHAT TO EXPECT AT YOUR PRENATAL  EXAMS  You will have prenatal exams every 2 weeks until week 36. Then, you will have weekly prenatal exams. During a routine prenatal visit: 1. You will be weighed to make sure you and the fetus are growing normally. 2. Your blood pressure is taken. 3. Your abdomen will be measured to track your baby's growth. 4. The fetal heartbeat will be listened to. 5. Any test results from the previous visit will be discussed. 6. You may have a cervical check near your due date to see if you have effaced. At around 36 weeks, your caregiver will check your cervix. At the same time, your caregiver will also perform a test on the secretions of the vaginal tissue. This test is to determine if a type of bacteria, Group B streptococcus, is present. Your caregiver will explain this further. Your caregiver may ask you:  What your birth plan is.  How you are feeling.  If you are feeling the baby move.  If you have had any abnormal symptoms, such as leaking fluid, bleeding, severe headaches, or abdominal cramping.  If you are using any tobacco products, including cigarettes, chewing tobacco, and electronic cigarettes.  If you have any questions. Other tests or screenings that may be performed during your third trimester include:  Blood tests that check for low iron levels (anemia).  Fetal testing to check the health, activity level, and growth of the fetus. Testing is done if you have certain medical conditions or if  there are problems during the pregnancy.  HIV (human immunodeficiency virus) testing. If you are at high risk, you may be screened for HIV during your third trimester of pregnancy. FALSE LABOR You may feel small, irregular contractions that eventually go away. These are called Braxton Hicks contractions, or false labor. Contractions may last for hours, days, or even weeks before true labor sets in. If contractions come at regular intervals, intensify, or become painful, it is best to be seen by  your caregiver.  SIGNS OF LABOR   Menstrual-like cramps.  Contractions that are 5 minutes apart or less.  Contractions that start on the top of the uterus and spread down to the lower abdomen and back.  A sense of increased pelvic pressure or back pain.  A watery or bloody mucus discharge that comes from the vagina. If you have any of these signs before the 37th week of pregnancy, call your caregiver right away. You need to go to the hospital to get checked immediately. HOME CARE INSTRUCTIONS   Avoid all smoking, herbs, alcohol, and unprescribed drugs. These chemicals affect the formation and growth of the baby.  Do not use any tobacco products, including cigarettes, chewing tobacco, and electronic cigarettes. If you need help quitting, ask your health care provider. You may receive counseling support and other resources to help you quit.  Follow your caregiver's instructions regarding medicine use. There are medicines that are either safe or unsafe to take during pregnancy.  Exercise only as directed by your caregiver. Experiencing uterine cramps is a good sign to stop exercising.  Continue to eat regular, healthy meals.  Wear a good support bra for breast tenderness.  Do not use hot tubs, steam rooms, or saunas.  Wear your seat belt at all times when driving.  Avoid raw meat, uncooked cheese, cat litter boxes, and soil used by cats. These carry germs that can cause birth defects in the baby.  Take your prenatal vitamins.  Take 1500-2000 mg of calcium daily starting at the 20th week of pregnancy until you deliver your baby.  Try taking a stool softener (if your caregiver approves) if you develop constipation. Eat more high-fiber foods, such as fresh vegetables or fruit and whole grains. Drink plenty of fluids to keep your urine clear or pale yellow.  Take warm sitz baths to soothe any pain or discomfort caused by hemorrhoids. Use hemorrhoid cream if your caregiver  approves.  If you develop varicose veins, wear support hose. Elevate your feet for 15 minutes, 3-4 times a day. Limit salt in your diet.  Avoid heavy lifting, wear low heal shoes, and practice good posture.  Rest a lot with your legs elevated if you have leg cramps or low back pain.  Visit your dentist if you have not gone during your pregnancy. Use a soft toothbrush to brush your teeth and be gentle when you floss.  A sexual relationship may be continued unless your caregiver directs you otherwise.  Do not travel far distances unless it is absolutely necessary and only with the approval of your caregiver.  Take prenatal classes to understand, practice, and ask questions about the labor and delivery.  Make a trial run to the hospital.  Pack your hospital bag.  Prepare the baby's nursery.  Continue to go to all your prenatal visits as directed by your caregiver. SEEK MEDICAL CARE IF:  You are unsure if you are in labor or if your water has broken.  You have dizziness.  You have  mild pelvic cramps, pelvic pressure, or nagging pain in your abdominal area.  You have persistent nausea, vomiting, or diarrhea.  You have a bad smelling vaginal discharge.  You have pain with urination. SEEK IMMEDIATE MEDICAL CARE IF:   You have a fever.  You are leaking fluid from your vagina.  You have spotting or bleeding from your vagina.  You have severe abdominal cramping or pain.  You have rapid weight loss or gain.  You have shortness of breath with chest pain.  You notice sudden or extreme swelling of your face, hands, ankles, feet, or legs.  You have not felt your baby move in over an hour.  You have severe headaches that do not go away with medicine.  You have vision changes.   This information is not intended to replace advice given to you by your health care provider. Make sure you discuss any questions you have with your health care provider.   Document Released:  12/14/2000 Document Revised: 01/10/2014 Document Reviewed: 02/21/2012 Elsevier Interactive Patient Education 2016 [employer]  Fetal Movement Counts Patient Name: __________________________________________________ Patient Due Date: ____________________ Performing a fetal movement count is highly recommended in high-risk pregnancies, but it is good for every pregnant woman to do. Your health care provider may ask you to start counting fetal movements at 28 weeks of the pregnancy. Fetal movements often increase:  After eating a full meal.  After physical activity.  After eating or drinking something sweet or cold.  At rest. Pay attention to when you feel the baby is most active. This will help you notice a pattern of your baby's sleep and wake cycles and what factors contribute to an increase in fetal movement. It is important to perform a fetal movement count at the same time each day when your baby is normally most active.  HOW TO COUNT FETAL MOVEMENTS 7. Find a quiet and comfortable area to sit or lie down on your left side. Lying on your left side provides the best blood and oxygen circulation to your baby. 8. Write down the day and time on a sheet of paper or in a journal. 9. Start counting kicks, flutters, swishes, rolls, or jabs in a 2-hour period. You should feel at least 10 movements within 2 hours. 10. If you do not feel 10 movements in 2 hours, wait 2-3 hours and count again. Look for a change in the pattern or not enough counts in 2 hours. SEEK MEDICAL CARE IF:  You feel less than 10 counts in 2 hours, tried twice.  There is no movement in over an hour.  The pattern is changing or taking longer each day to reach 10 counts in 2 hours.  You feel the baby is not moving as he or she usually does. Date: ____________ Movements: ____________ Start time: ____________ Doreatha Martin time: ____________  Date: ____________ Movements: ____________ Start time: ____________ Doreatha Martin time:  ____________ Date: ____________ Movements: ____________ Start time: ____________ Doreatha Martin time: ____________ Date: ____________ Movements: ____________ Start time: ____________ Doreatha Martin time: ____________ Date: ____________ Movements: ____________ Start time: ____________ Doreatha Martin time: ____________ Date: ____________ Movements: ____________ Start time: ____________ Doreatha Martin time: ____________ Date: ____________ Movements: ____________ Start time: ____________ Doreatha Martin time: ____________ Date: ____________ Movements: ____________ Start time: ____________ Doreatha Martin time: ____________  Date: ____________ Movements: ____________ Start time: ____________ Doreatha Martin time: ____________ Date: ____________ Movements: ____________ Start time: ____________ Doreatha Martin time: ____________ Date: ____________ Movements: ____________ Start time: ____________ Doreatha Martin time: ____________ Date: ____________ Movements: ____________ Start time: ____________ Doreatha Martin time: ____________  ____________ Movements: ____________ Start time: ____________ Finish time: ____________ Date: ____________ Movements: ____________ Start time: ____________ Finish time: ____________ Date: ____________ Movements: ____________ Start time: ____________ Finish time: ____________  Date: ____________ Movements: ____________ Start time: ____________ Finish time: ____________ Date: ____________ Movements: ____________ Start time: ____________ Finish time: ____________ Date: ____________ Movements: ____________ Start time: ____________ Finish time: ____________ Date: ____________ Movements: ____________ Start time: ____________ Finish time: ____________ Date: ____________ Movements: ____________ Start time: ____________ Finish time: ____________ Date: ____________ Movements: ____________ Start time: ____________ Finish time: ____________ Date: ____________ Movements: ____________ Start time: ____________ Finish time: ____________  Date: ____________ Movements:  ____________ Start time: ____________ Finish time: ____________ Date: ____________ Movements: ____________ Start time: ____________ Finish time: ____________ Date: ____________ Movements: ____________ Start time: ____________ Finish time: ____________ Date: ____________ Movements: ____________ Start time: ____________ Finish time: ____________ Date: ____________ Movements: ____________ Start time: ____________ Finish time: ____________ Date: ____________ Movements: ____________ Start time: ____________ Finish time: ____________ Date: ____________ Movements: ____________ Start time: ____________ Finish time: ____________  Date: ____________ Movements: ____________ Start time: ____________ Finish time: ____________ Date: ____________ Movements: ____________ Start time: ____________ Finish time: ____________ Date: ____________ Movements: ____________ Start time: ____________ Finish time: ____________ Date: ____________ Movements: ____________ Start time: ____________ Finish time: ____________ Date: ____________ Movements: ____________ Start time: ____________ Finish time: ____________ Date: ____________ Movements: ____________ Start time: ____________ Finish time: ____________ Date: ____________ Movements: ____________ Start time: ____________ Finish time: ____________  Date: ____________ Movements: ____________ Start time: ____________ Finish time: ____________ Date: ____________ Movements: ____________ Start time: ____________ Finish time: ____________ Date: ____________ Movements: ____________ Start time: ____________ Finish time: ____________ Date: ____________ Movements: ____________ Start time: ____________ Finish time: ____________ Date: ____________ Movements: ____________ Start time: ____________ Finish time: ____________ Date: ____________ Movements: ____________ Start time: ____________ Finish time: ____________ Date: ____________ Movements: ____________ Start time: ____________ Finish  time: ____________  Date: ____________ Movements: ____________ Start time: ____________ Finish time: ____________ Date: ____________ Movements: ____________ Start time: ____________ Finish time: ____________ Date: ____________ Movements: ____________ Start time: ____________ Finish time: ____________ Date: ____________ Movements: ____________ Start time: ____________ Finish time: ____________ Date: ____________ Movements: ____________ Start time: ____________ Finish time: ____________ Date: ____________ Movements: ____________ Start time: ____________ Finish time: ____________ Date: ____________ Movements: ____________ Start time: ____________ Finish time: ____________  Date: ____________ Movements: ____________ Start time: ____________ Finish time: ____________ Date: ____________ Movements: ____________ Start time: ____________ Finish time: ____________ Date: ____________ Movements: ____________ Start time: ____________ Finish time: ____________ Date: ____________ Movements: ____________ Start time: ____________ Finish time: ____________ Date: ____________ Movements: ____________ Start time: ____________ Finish time: ____________ Date: ____________ Movements: ____________ Start time: ____________ Finish time: ____________   This information is not intended to replace advice given to you by your health care provider. Make sure you discuss any questions you have with your health care provider.   Document Released: 01/19/2006 Document Revised: 01/10/2014 Document Reviewed: 10/17/2011 Elsevier Interactive Patient Education 2016 [employer] Inc[employer]  

## 2015-05-18 NOTE — Progress Notes (Signed)
Urine: small amt leukocytes Breastfeeding tip reviewed

## 2015-05-19 LAB — GC/CHLAMYDIA PROBE AMP (~~LOC~~) NOT AT ARMC
CHLAMYDIA, DNA PROBE: NEGATIVE
NEISSERIA GONORRHEA: NEGATIVE

## 2015-05-20 NOTE — Addendum Note (Signed)
Encounter addended by: Belem Hintze P Shylah Dossantos, RN on: 05/20/2015 10:07 AM<BR>     Documentation filed: Charges VN

## 2015-05-21 LAB — CULTURE, BETA STREP (GROUP B ONLY)

## 2015-05-25 ENCOUNTER — Encounter: Payer: Self-pay | Admitting: Obstetrics and Gynecology

## 2015-05-25 ENCOUNTER — Ambulatory Visit (INDEPENDENT_AMBULATORY_CARE_PROVIDER_SITE_OTHER): Payer: Self-pay | Admitting: Obstetrics and Gynecology

## 2015-05-25 VITALS — BP 127/70 | HR 73 | Wt 239.1 lb

## 2015-05-25 DIAGNOSIS — IMO0002 Reserved for concepts with insufficient information to code with codable children: Secondary | ICD-10-CM

## 2015-05-25 DIAGNOSIS — O09523 Supervision of elderly multigravida, third trimester: Secondary | ICD-10-CM

## 2015-05-25 DIAGNOSIS — O0993 Supervision of high risk pregnancy, unspecified, third trimester: Secondary | ICD-10-CM

## 2015-05-25 DIAGNOSIS — D571 Sickle-cell disease without crisis: Secondary | ICD-10-CM

## 2015-05-25 DIAGNOSIS — O99013 Anemia complicating pregnancy, third trimester: Secondary | ICD-10-CM

## 2015-05-25 DIAGNOSIS — R896 Abnormal cytological findings in specimens from other organs, systems and tissues: Secondary | ICD-10-CM

## 2015-05-25 DIAGNOSIS — B977 Papillomavirus as the cause of diseases classified elsewhere: Secondary | ICD-10-CM

## 2015-05-25 LAB — POCT URINALYSIS DIP (DEVICE)
Bilirubin Urine: NEGATIVE
Glucose, UA: NEGATIVE mg/dL
Hgb urine dipstick: NEGATIVE
KETONES UR: NEGATIVE mg/dL
Leukocytes, UA: NEGATIVE
Nitrite: NEGATIVE
PH: 6.5 (ref 5.0–8.0)
PROTEIN: NEGATIVE mg/dL
SPECIFIC GRAVITY, URINE: 1.015 (ref 1.005–1.030)
UROBILINOGEN UA: 1 mg/dL (ref 0.0–1.0)

## 2015-05-25 NOTE — Progress Notes (Signed)
Subjective:  Taylor Roman is a 35 y.o. [id number] at 1657w0d being seen today for ongoing prenatal care.  She is currently monitored for the following issues for this high-risk pregnancy and has Sickle cell disease (HCC); High risk HPV infection; LGSIL (low grade squamous intraepithelial dysplasia); Supervision of high risk pregnancy, antepartum; Advanced maternal age in multigravida; and Maternal sickle cell anemia affecting pregnancy, antepartum (HCC) on her problem list.  Patient reports no complaints.  Contractions: Irregular. Vag. Bleeding: None.  Movement: Present. Denies leaking of fluid.   The following portions of the patient's history were reviewed and updated as appropriate: allergies, current medications, past family history, past medical history, past social history, past surgical history and problem list. Problem list updated.  Objective:   Filed Vitals:   05/25/15 0928  BP: 127/70  Pulse: 73  Weight: 239 lb 1.6 oz (108.455 kg)    Fetal Status: Fetal Heart Rate (bpm): 140   Movement: Present     General:  Alert, oriented and cooperative. Patient is in no acute distress.  Skin: Skin is warm and dry. No rash noted.   Cardiovascular: Normal heart rate noted  Respiratory: Normal respiratory effort, no problems with respiration noted  Abdomen: Soft, gravid, appropriate for gestational age. Pain/Pressure: Present     Pelvic: Vag. Bleeding: None     Cervical exam deferred        Extremities: Normal range of motion.  Edema: None  Mental Status: Normal mood and affect. Normal behavior. Normal judgment and thought content.   Urinalysis: Urine Protein: Negative Urine Glucose: Negative  Assessment and Plan:  Pregnancy: [id number] at 8457w0d  1. Advanced maternal age in multigravida, third trimester   2. Supervision of high risk pregnancy, antepartum, third trimester Continue current care Growth ultrasound on 5/24  3. LGSIL (low grade squamous intraepithelial dysplasia) Repeat colpo  pp  4. High risk HPV infection   5. 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 Please refer to After Visit Summary for other counseling recommendations.  Return in about 1 week (around 06/01/2015).   Catalina AntiguaPeggy Allexa Acoff, MD

## 2015-05-27 ENCOUNTER — Encounter (HOSPITAL_COMMUNITY): Payer: Self-pay

## 2015-05-27 ENCOUNTER — Ambulatory Visit (HOSPITAL_COMMUNITY)
Admission: RE | Admit: 2015-05-27 | Discharge: 2015-05-27 | Disposition: A | Discharge status: Home / Self Care | Payer: Self-pay | Source: Ambulatory Visit | Attending: Family | Admitting: Family

## 2015-05-27 DIAGNOSIS — D571 Sickle-cell disease without crisis: Secondary | ICD-10-CM | POA: Insufficient documentation

## 2015-05-27 DIAGNOSIS — O99019 Anemia complicating pregnancy, unspecified trimester: Secondary | ICD-10-CM

## 2015-05-27 DIAGNOSIS — O09523 Supervision of elderly multigravida, third trimester: Secondary | ICD-10-CM | POA: Insufficient documentation

## 2015-05-27 DIAGNOSIS — O99013 Anemia complicating pregnancy, third trimester: Secondary | ICD-10-CM | POA: Insufficient documentation

## 2015-05-27 DIAGNOSIS — O99213 Obesity complicating pregnancy, third trimester: Secondary | ICD-10-CM | POA: Insufficient documentation

## 2015-05-27 DIAGNOSIS — Z3A36 36 weeks gestation of pregnancy: Secondary | ICD-10-CM | POA: Insufficient documentation

## 2015-05-27 NOTE — Progress Notes (Signed)
Subjective:  Taylor Roman is a 35 y.o. [id number] at 5143w2d being seen today for ongoing prenatal care.  She is currently monitored for the following issues for this high-risk pregnancy and has Sickle cell disease (HCC); High risk HPV infection; LGSIL (low grade squamous intraepithelial dysplasia); Supervision of high risk pregnancy, antepartum; Advanced maternal age in multigravida; and Maternal sickle cell anemia affecting pregnancy, antepartum (HCC) on her problem list.  Patient reports no complaints.  Contractions: Irregular. Vag. Bleeding: None.  Movement: Present. Denies leaking of fluid.   The following portions of the patient's history were reviewed and updated as appropriate: allergies, current medications, past family history, past medical history, past social history, past surgical history and problem list. Problem list updated.  Objective:   Filed Vitals:   05/18/15 1114  BP: 141/71  Pulse: 77  Temp: 98.5 F (36.9 C)  Weight: 239 lb 3.2 oz (108.5 kg)    Fetal Status: Fetal Heart Rate (bpm): 158 Fundal Height: 35 cm Movement: Present     General:  Alert, oriented and cooperative. Patient is in no acute distress.  Skin: Skin is warm and dry. No rash noted.   Cardiovascular: Normal heart rate noted  Respiratory: Normal respiratory effort, no problems with respiration noted  Abdomen: Soft, gravid, appropriate for gestational age. Pain/Pressure: Present     Pelvic: Vag. Bleeding: None     Cervical exam deferred        Extremities: Normal range of motion.  Edema: Trace  Mental Status: Normal mood and affect. Normal behavior. Normal judgment and thought content.   Urinalysis: Urine Protein: Negative Urine Glucose: Negative  Assessment and Plan:  Pregnancy: [id number] at 1543w2d  1. Supervision of high risk pregnancy, antepartum, third trimester - GC/Chlamydia probe amp [facility] - Culture, beta strep (group b only)  2. Advanced maternal age in multigravida, third  trimester  3. Hb-SS disease without crisis (HCC)  4. Maternal sickle cell anemia affecting pregnancy, antepartum (HCC)   Preterm labor symptoms and general obstetric precautions including but not limited to vaginal bleeding, contractions, leaking of fluid and fetal movement were reviewed in detail with the patient. Please refer to After Visit Summary for other counseling recommendations.  Return in about 1 week (around 05/25/2015) for Routine prenatal care (please schedule 1.5 weeks from now).   Federico FlakeKimberly Niles Redmond Whittley, MD

## 2015-06-08 ENCOUNTER — Ambulatory Visit (INDEPENDENT_AMBULATORY_CARE_PROVIDER_SITE_OTHER): Payer: Self-pay | Admitting: Family Medicine

## 2015-06-08 VITALS — BP 129/74 | HR 79 | Wt 240.0 lb

## 2015-06-08 DIAGNOSIS — O99013 Anemia complicating pregnancy, third trimester: Secondary | ICD-10-CM

## 2015-06-08 DIAGNOSIS — D571 Sickle-cell disease without crisis: Secondary | ICD-10-CM

## 2015-06-08 DIAGNOSIS — O0993 Supervision of high risk pregnancy, unspecified, third trimester: Secondary | ICD-10-CM

## 2015-06-08 DIAGNOSIS — O99019 Anemia complicating pregnancy, unspecified trimester: Secondary | ICD-10-CM

## 2015-06-08 LAB — POCT URINALYSIS DIP (DEVICE)
BILIRUBIN URINE: NEGATIVE
Glucose, UA: NEGATIVE mg/dL
HGB URINE DIPSTICK: NEGATIVE
KETONES UR: NEGATIVE mg/dL
LEUKOCYTES UA: NEGATIVE
Nitrite: NEGATIVE
PH: 6 (ref 5.0–8.0)
Protein, ur: NEGATIVE mg/dL
SPECIFIC GRAVITY, URINE: 1.015 (ref 1.005–1.030)
Urobilinogen, UA: 1 mg/dL (ref 0.0–1.0)

## 2015-06-08 NOTE — Patient Instructions (Signed)
Third Trimester of Pregnancy The third trimester is from week 29 through week 42, months 7 through 9. The third trimester is a time when the fetus is growing rapidly. At the end of the ninth month, the fetus is about 20 inches in length and weighs 6-10 pounds.  BODY CHANGES Your body goes through many changes during pregnancy. The changes vary from woman to woman.   Your weight will continue to increase. You can expect to gain 25-35 pounds (11-16 kg) by the end of the pregnancy.  You may begin to get stretch marks on your hips, abdomen, and breasts.  You may urinate more often because the fetus is moving lower into your pelvis and pressing on your bladder.  You may develop or continue to have heartburn as a result of your pregnancy.  You may develop constipation because certain hormones are causing the muscles that push waste through your intestines to slow down.  You may develop hemorrhoids or swollen, bulging veins (varicose veins).  You may have pelvic pain because of the weight gain and pregnancy hormones relaxing your joints between the bones in your pelvis. Backaches may result from overexertion of the muscles supporting your posture.  You may have changes in your hair. These can include thickening of your hair, rapid growth, and changes in texture. Some women also have hair loss during or after pregnancy, or hair that feels dry or thin. Your hair will most likely return to normal after your baby is born.  Your breasts will continue to grow and be tender. A yellow discharge may leak from your breasts called colostrum.  Your belly button may stick out.  You may feel short of breath because of your expanding uterus.  You may notice the fetus "dropping," or moving lower in your abdomen.  You may have a bloody mucus discharge. This usually occurs a few days to a week before labor begins.  Your cervix becomes thin and soft (effaced) near your due date. WHAT TO EXPECT AT YOUR  PRENATAL EXAMS  You will have prenatal exams every 2 weeks until week 36. Then, you will have weekly prenatal exams. During a routine prenatal visit:  You will be weighed to make sure you and the fetus are growing normally.  Your blood pressure is taken.  Your abdomen will be measured to track your baby's growth.  The fetal heartbeat will be listened to.  Any test results from the previous visit will be discussed.  You may have a cervical check near your due date to see if you have effaced. At around 36 weeks, your caregiver will check your cervix. At the same time, your caregiver will also perform a test on the secretions of the vaginal tissue. This test is to determine if a type of bacteria, Group B streptococcus, is present. Your caregiver will explain this further. Your caregiver may ask you:  What your birth plan is.  How you are feeling.  If you are feeling the baby move.  If you have had any abnormal symptoms, such as leaking fluid, bleeding, severe headaches, or abdominal cramping.  If you are using any tobacco products, including cigarettes, chewing tobacco, and electronic cigarettes.  If you have any questions. Other tests or screenings that may be performed during your third trimester include:  Blood tests that check for low iron levels (anemia).  Fetal testing to check the health, activity level, and growth of the fetus. Testing is done if you have certain medical conditions or if   there are problems during the pregnancy.  HIV (human immunodeficiency virus) testing. If you are at high risk, you may be screened for HIV during your third trimester of pregnancy. FALSE LABOR You may feel small, irregular contractions that eventually go away. These are called Braxton Hicks contractions, or false labor. Contractions may last for hours, days, or even weeks before true labor sets in. If contractions come at regular intervals, intensify, or become painful, it is best to be seen  by your caregiver.  SIGNS OF LABOR   Menstrual-like cramps.  Contractions that are 5 minutes apart or less.  Contractions that start on the top of the uterus and spread down to the lower abdomen and back.  A sense of increased pelvic pressure or back pain.  A watery or bloody mucus discharge that comes from the vagina. If you have any of these signs before the 37th week of pregnancy, call your caregiver right away. You need to go to the hospital to get checked immediately. HOME CARE INSTRUCTIONS   Avoid all smoking, herbs, alcohol, and unprescribed drugs. These chemicals affect the formation and growth of the baby.  Do not use any tobacco products, including cigarettes, chewing tobacco, and electronic cigarettes. If you need help quitting, ask your health care provider. You may receive counseling support and other resources to help you quit.  Follow your caregiver's instructions regarding medicine use. There are medicines that are either safe or unsafe to take during pregnancy.  Exercise only as directed by your caregiver. Experiencing uterine cramps is a good sign to stop exercising.  Continue to eat regular, healthy meals.  Wear a good support bra for breast tenderness.  Do not use hot tubs, steam rooms, or saunas.  Wear your seat belt at all times when driving.  Avoid raw meat, uncooked cheese, cat litter boxes, and soil used by cats. These carry germs that can cause birth defects in the baby.  Take your prenatal vitamins.  Take 1500-2000 mg of calcium daily starting at the 20th week of pregnancy until you deliver your baby.  Try taking a stool softener (if your caregiver approves) if you develop constipation. Eat more high-fiber foods, such as fresh vegetables or fruit and whole grains. Drink plenty of fluids to keep your urine clear or pale yellow.  Take warm sitz baths to soothe any pain or discomfort caused by hemorrhoids. Use hemorrhoid cream if your caregiver  approves.  If you develop varicose veins, wear support hose. Elevate your feet for 15 minutes, 3-4 times a day. Limit salt in your diet.  Avoid heavy lifting, wear low heal shoes, and practice good posture.  Rest a lot with your legs elevated if you have leg cramps or low back pain.  Visit your dentist if you have not gone during your pregnancy. Use a soft toothbrush to brush your teeth and be gentle when you floss.  A sexual relationship may be continued unless your caregiver directs you otherwise.  Do not travel far distances unless it is absolutely necessary and only with the approval of your caregiver.  Take prenatal classes to understand, practice, and ask questions about the labor and delivery.  Make a trial run to the hospital.  Pack your hospital bag.  Prepare the baby's nursery.  Continue to go to all your prenatal visits as directed by your caregiver. SEEK MEDICAL CARE IF:  You are unsure if you are in labor or if your water has broken.  You have dizziness.  You have   mild pelvic cramps, pelvic pressure, or nagging pain in your abdominal area.  You have persistent nausea, vomiting, or diarrhea.  You have a bad smelling vaginal discharge.  You have pain with urination. SEEK IMMEDIATE MEDICAL CARE IF:   You have a fever.  You are leaking fluid from your vagina.  You have spotting or bleeding from your vagina.  You have severe abdominal cramping or pain.  You have rapid weight loss or gain.  You have shortness of breath with chest pain.  You notice sudden or extreme swelling of your face, hands, ankles, feet, or legs.  You have not felt your baby move in over an hour.  You have severe headaches that do not go away with medicine.  You have vision changes.   This information is not intended to replace advice given to you by your health care provider. Make sure you discuss any questions you have with your health care provider.   Document Released:  12/14/2000 Document Revised: 01/10/2014 Document Reviewed: 02/21/2012 Elsevier Interactive Patient Education 2016 [employer]  Breastfeeding Deciding to breastfeed is one of the best choices you can make for you and your baby. A change in hormones during pregnancy causes your breast tissue to grow and increases the number and size of your milk ducts. These hormones also allow proteins, sugars, and fats from your blood supply to make breast milk in your milk-producing glands. Hormones prevent breast milk from being released before your baby is born as well as prompt milk flow after birth. Once breastfeeding has begun, thoughts of your baby, as well as his or her sucking or crying, can stimulate the release of milk from your milk-producing glands.  BENEFITS OF BREASTFEEDING For Your Baby  Your first milk (colostrum) helps your baby's digestive system function better.  There are antibodies in your milk that help your baby fight off infections.  Your baby has a lower incidence of asthma, allergies, and sudden infant death syndrome.  The nutrients in breast milk are better for your baby than infant formulas and are designed uniquely for your baby's needs.  Breast milk improves your baby's brain development.  Your baby is less likely to develop other conditions, such as childhood obesity, asthma, or type 2 diabetes mellitus. For You  Breastfeeding helps to create a very special bond between you and your baby.  Breastfeeding is convenient. Breast milk is always available at the correct temperature and costs nothing.  Breastfeeding helps to burn calories and helps you lose the weight gained during pregnancy.  Breastfeeding makes your uterus contract to its prepregnancy size faster and slows bleeding (lochia) after you give birth.   Breastfeeding helps to lower your risk of developing type 2 diabetes mellitus, osteoporosis, and breast or ovarian cancer later in life. SIGNS THAT YOUR BABY IS  HUNGRY Early Signs of Hunger  Increased alertness or activity.  Stretching.  Movement of the head from side to side.  Movement of the head and opening of the mouth when the corner of the mouth or cheek is stroked (rooting).  Increased sucking sounds, smacking lips, cooing, sighing, or squeaking.  Hand-to-mouth movements.  Increased sucking of fingers or hands. Late Signs of Hunger  Fussing.  Intermittent crying. Extreme Signs of Hunger Signs of extreme hunger will require calming and consoling before your baby will be able to breastfeed successfully. Do not wait for the following signs of extreme hunger to occur before you initiate breastfeeding:  Restlessness.  A loud, strong cry.  Screaming.   BREASTFEEDING BASICS Breastfeeding Initiation  Find a comfortable place to sit or lie down, with your neck and back well supported.  Place a pillow or rolled up blanket under your baby to bring him or her to the level of your breast (if you are seated). Nursing pillows are specially designed to help support your arms and your baby while you breastfeed.  Make sure that your baby's abdomen is facing your abdomen.  Gently massage your breast. With your fingertips, massage from your chest wall toward your nipple in a circular motion. This encourages milk flow. You may need to continue this action during the feeding if your milk flows slowly.  Support your breast with 4 fingers underneath and your thumb above your nipple. Make sure your fingers are well away from your nipple and your baby's mouth.  Stroke your baby's lips gently with your finger or nipple.  When your baby's mouth is open wide enough, quickly bring your baby to your breast, placing your entire nipple and as much of the colored area around your nipple (areola) as possible into your baby's mouth.  More areola should be visible above your baby's upper lip than below the lower lip.  Your baby's tongue should be between his  or her lower gum and your breast.  Ensure that your baby's mouth is correctly positioned around your nipple (latched). Your baby's lips should create a seal on your breast and be turned out (everted).  It is common for your baby to suck about 2-3 minutes in order to start the flow of breast milk. Latching Teaching your baby how to latch on to your breast properly is very important. An improper latch can cause nipple pain and decreased milk supply for you and poor weight gain in your baby. Also, if your baby is not latched onto your nipple properly, he or she may swallow some air during feeding. This can make your baby fussy. Burping your baby when you switch breasts during the feeding can help to get rid of the air. However, teaching your baby to latch on properly is still the best way to prevent fussiness from swallowing air while breastfeeding. Signs that your baby has successfully latched on to your nipple:  Silent tugging or silent sucking, without causing you pain.  Swallowing heard between every 3-4 sucks.  Muscle movement above and in front of his or her ears while sucking. Signs that your baby has not successfully latched on to nipple:  Sucking sounds or smacking sounds from your baby while breastfeeding.  Nipple pain. If you think your baby has not latched on correctly, slip your finger into the corner of your baby's mouth to break the suction and place it between your baby's gums. Attempt breastfeeding initiation again. Signs of Successful Breastfeeding Signs from your baby:  A gradual decrease in the number of sucks or complete cessation of sucking.  Falling asleep.  Relaxation of his or her body.  Retention of a small amount of milk in his or her mouth.  Letting go of your breast by himself or herself. Signs from you:  Breasts that have increased in firmness, weight, and size 1-3 hours after feeding.  Breasts that are softer immediately after  breastfeeding.  Increased milk volume, as well as a change in milk consistency and color by the fifth day of breastfeeding.  Nipples that are not sore, cracked, or bleeding. Signs That Your Baby is Getting Enough Milk  Wetting at least 3 diapers in a 24-hour period.   The urine should be clear and pale yellow by age 5 days.  At least 3 stools in a 24-hour period by age 5 days. The stool should be soft and yellow.  At least 3 stools in a 24-hour period by age 7 days. The stool should be seedy and yellow.  No loss of weight greater than 10% of birth weight during the first 3 days of age.  Average weight gain of 4-7 ounces (113-198 g) per week after age 4 days.  Consistent daily weight gain by age 5 days, without weight loss after the age of 2 weeks. After a feeding, your baby may spit up a small amount. This is common. BREASTFEEDING FREQUENCY AND DURATION Frequent feeding will help you make more milk and can prevent sore nipples and breast engorgement. Breastfeed when you feel the need to reduce the fullness of your breasts or when your baby shows signs of hunger. This is called "breastfeeding on demand." Avoid introducing a pacifier to your baby while you are working to establish breastfeeding (the first 4-6 weeks after your baby is born). After this time you may choose to use a pacifier. Research has shown that pacifier use during the first year of a baby's life decreases the risk of sudden infant death syndrome (SIDS). Allow your baby to feed on each breast as long as he or she wants. Breastfeed until your baby is finished feeding. When your baby unlatches or falls asleep while feeding from the first breast, offer the second breast. Because newborns are often sleepy in the first few weeks of life, you may need to awaken your baby to get him or her to feed. Breastfeeding times will vary from baby to baby. However, the following rules can serve as a guide to help you ensure that your baby is  properly fed:  Newborns (babies 4 weeks of age or younger) may breastfeed every 1-3 hours.  Newborns should not go longer than 3 hours during the day or 5 hours during the night without breastfeeding.  You should breastfeed your baby a minimum of 8 times in a 24-hour period until you begin to introduce solid foods to your baby at around 6 months of age. BREAST MILK PUMPING Pumping and storing breast milk allows you to ensure that your baby is exclusively fed your breast milk, even at times when you are unable to breastfeed. This is especially important if you are going back to work while you are still breastfeeding or when you are not able to be present during feedings. Your lactation consultant can give you guidelines on how long it is safe to store breast milk. A breast pump is a machine that allows you to pump milk from your breast into a sterile bottle. The pumped breast milk can then be stored in a refrigerator or freezer. Some breast pumps are operated by hand, while others use electricity. Ask your lactation consultant which type will work best for you. Breast pumps can be purchased, but some hospitals and breastfeeding support groups lease breast pumps on a monthly basis. A lactation consultant can teach you how to hand express breast milk, if you prefer not to use a pump. CARING FOR YOUR BREASTS WHILE YOU BREASTFEED Nipples can become dry, cracked, and sore while breastfeeding. The following recommendations can help keep your breasts moisturized and healthy:  Avoid using soap on your nipples.  Wear a supportive bra. Although not required, special nursing bras and tank tops are designed to allow access to your   breasts for breastfeeding without taking off your entire bra or top. Avoid wearing underwire-style bras or extremely tight bras.  Air dry your nipples for 3-4minutes after each feeding.  Use only cotton bra pads to absorb leaked breast milk. Leaking of breast milk between feedings  is normal.  Use lanolin on your nipples after breastfeeding. Lanolin helps to maintain your skin's normal moisture barrier. If you use pure lanolin, you do not need to wash it off before feeding your baby again. Pure lanolin is not toxic to your baby. You may also hand express a few drops of breast milk and gently massage that milk into your nipples and allow the milk to air dry. In the first few weeks after giving birth, some women experience extremely full breasts (engorgement). Engorgement can make your breasts feel heavy, warm, and tender to the touch. Engorgement peaks within 3-5 days after you give birth. The following recommendations can help ease engorgement:  Completely empty your breasts while breastfeeding or pumping. You may want to start by applying warm, moist heat (in the shower or with warm water-soaked hand towels) just before feeding or pumping. This increases circulation and helps the milk flow. If your baby does not completely empty your breasts while breastfeeding, pump any extra milk after he or she is finished.  Wear a snug bra (nursing or regular) or tank top for 1-2 days to signal your body to slightly decrease milk production.  Apply ice packs to your breasts, unless this is too uncomfortable for you.  Make sure that your baby is latched on and positioned properly while breastfeeding. If engorgement persists after 48 hours of following these recommendations, contact your health care provider or a lactation consultant. OVERALL HEALTH CARE RECOMMENDATIONS WHILE BREASTFEEDING  Eat healthy foods. Alternate between meals and snacks, eating 3 of each per day. Because what you eat affects your breast milk, some of the foods may make your baby more irritable than usual. Avoid eating these foods if you are sure that they are negatively affecting your baby.  Drink milk, fruit juice, and water to satisfy your thirst (about 10 glasses a day).  Rest often, relax, and continue to take  your prenatal vitamins to prevent fatigue, stress, and anemia.  Continue breast self-awareness checks.  Avoid chewing and smoking tobacco. Chemicals from cigarettes that pass into breast milk and exposure to secondhand smoke may harm your baby.  Avoid alcohol and drug use, including marijuana. Some medicines that may be harmful to your baby can pass through breast milk. It is important to ask your health care provider before taking any medicine, including all over-the-counter and prescription medicine as well as vitamin and herbal supplements. It is possible to become pregnant while breastfeeding. If birth control is desired, ask your health care provider about options that will be safe for your baby. SEEK MEDICAL CARE IF:  You feel like you want to stop breastfeeding or have become frustrated with breastfeeding.  You have painful breasts or nipples.  Your nipples are cracked or bleeding.  Your breasts are red, tender, or warm.  You have a swollen area on either breast.  You have a fever or chills.  You have nausea or vomiting.  You have drainage other than breast milk from your nipples.  Your breasts do not become full before feedings by the fifth day after you give birth.  You feel sad and depressed.  Your baby is too sleepy to eat well.  Your baby is having trouble sleeping.     Your baby is wetting less than 3 diapers in a 24-hour period.  Your baby has less than 3 stools in a 24-hour period.  Your baby's skin or the white part of his or her eyes becomes yellow.   Your baby is not gaining weight by 5 days of age. SEEK IMMEDIATE MEDICAL CARE IF:  Your baby is overly tired (lethargic) and does not want to wake up and feed.  Your baby develops an unexplained fever.   This information is not intended to replace advice given to you by your health care provider. Make sure you discuss any questions you have with your health care provider.   Document Released: 12/20/2004  Document Revised: 09/10/2014 Document Reviewed: 06/13/2012 Elsevier Interactive Patient Education 2016 [employer]  

## 2015-06-08 NOTE — Progress Notes (Signed)
Subjective:  Taylor Roman is a 35 y.o. [id number] at 5495w0d being seen today for ongoing prenatal care.  She is currently monitored for the following issues for this high-risk pregnancy and has Sickle cell-hemoglobin C disease (HCC); High risk HPV infection; LGSIL (low grade squamous intraepithelial dysplasia); Supervision of high risk pregnancy, antepartum; Advanced maternal age in multigravida; and Maternal sickle cell anemia affecting pregnancy, antepartum (HCC) on her problem list.  Patient reports no complaints.  Contractions: Irregular. Vag. Bleeding: None.  Movement: Present. Denies leaking of fluid.   The following portions of the patient's history were reviewed and updated as appropriate: allergies, current medications, past family history, past medical history, past social history, past surgical history and problem list. Problem list updated.  Objective:   Filed Vitals:   06/08/15 0932  BP: 129/74  Pulse: 79  Weight: 240 lb (108.863 kg)    Fetal Status: Fetal Heart Rate (bpm): 150 Fundal Height: 37 cm Movement: Present     General:  Alert, oriented and cooperative. Patient is in no acute distress.  Skin: Skin is warm and dry. No rash noted.   Cardiovascular: Normal heart rate noted  Respiratory: Normal respiratory effort, no problems with respiration noted  Abdomen: Soft, gravid, appropriate for gestational age. Pain/Pressure: Present     Pelvic: Vag. Bleeding: None     Cervical exam deferred        Extremities: Normal range of motion.  Edema: None  Mental Status: Normal mood and affect. Normal behavior. Normal judgment and thought content.   Urinalysis: Urine Protein: Negative Urine Glucose: Negative  Assessment and Plan:  Pregnancy: [id number] at 6895w0d  1. Supervision of high risk pregnancy, antepartum, third trimester Continue prenatal care.   2. Maternal sickle cell anemia affecting pregnancy, antepartum (HCC) No crises and no issues  Term labor symptoms and general  obstetric precautions including but not limited to vaginal bleeding, contractions, leaking of fluid and fetal movement were reviewed in detail with the patient. Please refer to After Visit Summary for other counseling recommendations.  Return in 1 week (on 06/15/2015).   Reva Boresanya S Yaw Escoto, MD

## 2015-06-15 ENCOUNTER — Ambulatory Visit (INDEPENDENT_AMBULATORY_CARE_PROVIDER_SITE_OTHER): Payer: Self-pay | Admitting: Obstetrics & Gynecology

## 2015-06-15 VITALS — BP 119/72 | HR 71 | Wt 248.3 lb

## 2015-06-15 DIAGNOSIS — O99019 Anemia complicating pregnancy, unspecified trimester: Principal | ICD-10-CM

## 2015-06-15 DIAGNOSIS — O99013 Anemia complicating pregnancy, third trimester: Secondary | ICD-10-CM

## 2015-06-15 DIAGNOSIS — D571 Sickle-cell disease without crisis: Secondary | ICD-10-CM

## 2015-06-15 DIAGNOSIS — O0993 Supervision of high risk pregnancy, unspecified, third trimester: Secondary | ICD-10-CM

## 2015-06-15 LAB — POCT URINALYSIS DIP (DEVICE)
Bilirubin Urine: NEGATIVE
Glucose, UA: NEGATIVE mg/dL
HGB URINE DIPSTICK: NEGATIVE
Ketones, ur: NEGATIVE mg/dL
LEUKOCYTES UA: NEGATIVE
NITRITE: NEGATIVE
Protein, ur: NEGATIVE mg/dL
Specific Gravity, Urine: 1.01 (ref 1.005–1.030)
UROBILINOGEN UA: 1 mg/dL (ref 0.0–1.0)
pH: 6 (ref 5.0–8.0)

## 2015-06-15 NOTE — Progress Notes (Signed)
Subjective:  Taylor Roman is a 35 y.o. [id number] at 1332w0d being seen today for ongoing prenatal care.  She is currently monitored for the following issues for this high-risk pregnancy and has Sickle cell-hemoglobin C disease (HCC); High risk HPV infection; LGSIL (low grade squamous intraepithelial dysplasia); Supervision of high risk pregnancy, antepartum; Advanced maternal age in multigravida; and Maternal sickle cell anemia affecting pregnancy, antepartum (HCC) on her problem list.  Patient reports intermittent headache. Pain that starts in the back of her head and runs down her spine, happens sporadically. No inciting or exacerbating symptoms. No associated symptoms.   Contractions: Irregular. Vag. Bleeding: None.  Movement: Present. Denies leaking of fluid.   The following portions of the patient's history were reviewed and updated as appropriate: allergies, current medications, past family history, past medical history, past social history, past surgical history and problem list. Problem list updated.  Objective:   Filed Vitals:   06/15/15 0856  BP: 119/72  Pulse: 71  Weight: 248 lb 4.8 oz (112.628 kg)    Fetal Status: Fetal Heart Rate (bpm): 152   Movement: Present  Presentation: Vertex  General:  Alert, oriented and cooperative. Patient is in no acute distress.  Skin: Skin is warm and dry. No rash noted.   Cardiovascular: Normal heart rate noted  Respiratory: Normal respiratory effort, no problems with respiration noted  Abdomen: Soft, gravid, appropriate for gestational age. Pain/Pressure: Present     Pelvic: Cervical exam performed Dilation: 1 Effacement (%): 50 Station: Ballotable  Extremities: Normal range of motion.  Edema: Mild pitting, slight indentation  Mental Status: Normal mood and affect. Normal behavior. Normal judgment and thought content.   Urinalysis: Urine Protein: Negative Urine Glucose: Negative  Assessment and Plan:  Pregnancy: [id number] at 7532w0d  1. Maternal  sickle cell anemia affecting pregnancy, antepartum (HCC) IOL to be scheduled at 40 weeks.   2. Supervision of high risk pregnancy, antepartum, third trimester Told to return for worsening headaches, may need further evaluation such as imaging  Term labor symptoms and general obstetric precautions including but not limited to vaginal bleeding, contractions, leaking of fluid and fetal movement were reviewed in detail with the patient. Please refer to After Visit Summary for other counseling recommendations.  Return for Postpartum check.   Tereso NewcomerUgonna A Anyanwu, MD

## 2015-06-15 NOTE — Progress Notes (Signed)
IOL scheduled 06/19 @ 7am.

## 2015-06-15 NOTE — Patient Instructions (Signed)
Return to clinic for any scheduled appointments or obstetric concerns, or go to [facility] for evaluation  

## 2015-06-16 ENCOUNTER — Encounter: Payer: Self-pay | Admitting: *Deleted

## 2015-06-17 ENCOUNTER — Telehealth (HOSPITAL_COMMUNITY): Payer: Self-pay | Admitting: General Practice

## 2015-06-17 ENCOUNTER — Telehealth (HOSPITAL_COMMUNITY): Payer: Self-pay | Admitting: *Deleted

## 2015-06-17 NOTE — Telephone Encounter (Signed)
Preadmission assessment

## 2015-06-17 NOTE — Telephone Encounter (Signed)
Preadmission screen Interpreter number [phone]

## 2015-06-18 ENCOUNTER — Encounter (HOSPITAL_COMMUNITY): Payer: Self-pay | Admitting: *Deleted

## 2015-06-18 ENCOUNTER — Inpatient Hospital Stay (HOSPITAL_COMMUNITY): Payer: Self-pay

## 2015-06-18 ENCOUNTER — Inpatient Hospital Stay (HOSPITAL_COMMUNITY)
Admission: AD | Admit: 2015-06-18 | Discharge: 2015-06-18 | Disposition: A | Discharge status: Home / Self Care | Payer: Self-pay | Source: Ambulatory Visit | Attending: Obstetrics & Gynecology | Admitting: Obstetrics & Gynecology

## 2015-06-18 DIAGNOSIS — D573 Sickle-cell trait: Secondary | ICD-10-CM | POA: Insufficient documentation

## 2015-06-18 DIAGNOSIS — Z3A39 39 weeks gestation of pregnancy: Secondary | ICD-10-CM | POA: Insufficient documentation

## 2015-06-18 DIAGNOSIS — O368130 Decreased fetal movements, third trimester, not applicable or unspecified: Secondary | ICD-10-CM | POA: Insufficient documentation

## 2015-06-18 DIAGNOSIS — O368190 Decreased fetal movements, unspecified trimester, not applicable or unspecified: Secondary | ICD-10-CM

## 2015-06-18 DIAGNOSIS — O99013 Anemia complicating pregnancy, third trimester: Secondary | ICD-10-CM | POA: Insufficient documentation

## 2015-06-18 NOTE — MAU Provider Note (Signed)
MAU HISTORY AND PHYSICAL  Chief Complaint:  Decreased Fetal Movement   Taylor Roman is a 35 y.o.  [id number] with IUP at [redacted]w[redacted]d presenting for Decreased Fetal Movement Prenatal risk factors: Sickle cell dz, LGSIL, AMA, multipara Pt states that today after waking up at around 6AM noted that there was decreased fetal movement.  Yesterday had noted regular contractions but only irregular cramping today.  none vaginal bleeding, intact membranes, with decreased  fetal movement.  Last intercourse 4 days ago.   Denies any HA, changes in vision, RUQ pain, VB, LOF, BLE swelling, urinary symptoms.    Past Medical History  Diagnosis Date  . Anemia   . Sickle cell trait (HCC)   . Headache   . Vaginal Pap smear, abnormal   . Sickle cell anemia (HCC)     Past Surgical History  Procedure Laterality Date  . No past surgeries      Family History  Problem Relation Age of Onset  . Anesthesia problems Neg Hx     Social History  Substance Use Topics  . Smoking status: Never Smoker   . Smokeless tobacco: Never Used  . Alcohol Use: No    No Known Allergies  No prescriptions prior to admission    Review of Systems - Negative except for what is mentioned in HPI.  Physical Exam  Blood pressure 117/73, pulse 69, temperature 98.3 F (36.8 C), resp. rate 18, last menstrual period 09/15/2014, SpO2 100 %. GENERAL: Well-developed, well-nourished female in no acute distress.  LUNGS: Clear to auscultation bilaterally.  HEART: Regular rate and rhythm. ABDOMEN: Soft, nontender, nondistended, gravid.  EXTREMITIES: Nontender, no edema, 2+ distal pulses. Cervical Exam: Deferred Presentation: cephalic FHT:  Baseline 135, mod variability, +acels, no decels-reactive Contractions: Rare, mild to palpation   Labs: No results found for this or any previous visit (from the past 24 hour(s)).  Imaging Studies:  BPP Preliminary Report 8/8   MDM: Initially NST not reactive but became reactive with accels  prior to ultrasound.  With reactive NST and normal BPP, 10/10 score, pt stable for discharge.  Contractions improved from yesterday and are rare on monitor today so not in active labor.  Labor precautions/reasons to return to hospital reviewed with pt.  Assessment: Taylor Ravelingmina Slaubaugh is  35 y.o. [id number] at 466w3d presents with Decreased Fetal Movement Reactive NST  Plan: #Decreased fetal movement -BPP + NST 10/10 -Discussed kick counts and labor precautions -F/u with OB provider as scheduled  Rejeana BrockKelly Aguilar, MD  I have seen this patient and agree with the above resident's note.   LEFTWICH-KIRBY, Misty StanleyLISA 6/15/201711:48 AM

## 2015-06-18 NOTE — Discharge Instructions (Signed)
Fetal Movement Counts °Patient Name: __________________________________________________ Patient Due Date: ____________________ °Performing a fetal movement count is highly recommended in high-risk pregnancies, but it is good for every pregnant woman to do. Your health care provider may ask you to start counting fetal movements at 28 weeks of the pregnancy. Fetal movements often increase: °· After eating a full meal. °· After physical activity. °· After eating or drinking something sweet or cold. °· At rest. °Pay attention to when you feel the baby is most active. This will help you notice a pattern of your baby's sleep and wake cycles and what factors contribute to an increase in fetal movement. It is important to perform a fetal movement count at the same time each day when your baby is normally most active.  °HOW TO COUNT FETAL MOVEMENTS °· Find a quiet and comfortable area to sit or lie down on your left side. Lying on your left side provides the best blood and oxygen circulation to your baby. °· Write down the day and time on a sheet of paper or in a journal. °· Start counting kicks, flutters, swishes, rolls, or jabs in a 2-hour period. You should feel at least 10 movements within 2 hours. °· If you do not feel 10 movements in 2 hours, wait 2-3 hours and count again. Look for a change in the pattern or not enough counts in 2 hours. °SEEK MEDICAL CARE IF: °· You feel less than 10 counts in 2 hours, tried twice. °· There is no movement in over an hour. °· The pattern is changing or taking longer each day to reach 10 counts in 2 hours. °· You feel the baby is not moving as he or she usually does. °Date: ____________ Movements: ____________ Start time: ____________ Finish time: ____________  °Date: ____________ Movements: ____________ Start time: ____________ Finish time: ____________ °Date: ____________ Movements: ____________ Start time: ____________ Finish time: ____________ °Date: ____________ Movements:  ____________ Start time: ____________ Finish time: ____________ °Date: ____________ Movements: ____________ Start time: ____________ Finish time: ____________ °Date: ____________ Movements: ____________ Start time: ____________ Finish time: ____________ °Date: ____________ Movements: ____________ Start time: ____________ Finish time: ____________ °Date: ____________ Movements: ____________ Start time: ____________ Finish time: ____________  °Date: ____________ Movements: ____________ Start time: ____________ Finish time: ____________ °Date: ____________ Movements: ____________ Start time: ____________ Finish time: ____________ °Date: ____________ Movements: ____________ Start time: ____________ Finish time: ____________ °Date: ____________ Movements: ____________ Start time: ____________ Finish time: ____________ °Date: ____________ Movements: ____________ Start time: ____________ Finish time: ____________ °Date: ____________ Movements: ____________ Start time: ____________ Finish time: ____________ °Date: ____________ Movements: ____________ Start time: ____________ Finish time: ____________  °Date: ____________ Movements: ____________ Start time: ____________ Finish time: ____________ °Date: ____________ Movements: ____________ Start time: ____________ Finish time: ____________ °Date: ____________ Movements: ____________ Start time: ____________ Finish time: ____________ °Date: ____________ Movements: ____________ Start time: ____________ Finish time: ____________ °Date: ____________ Movements: ____________ Start time: ____________ Finish time: ____________ °Date: ____________ Movements: ____________ Start time: ____________ Finish time: ____________ °Date: ____________ Movements: ____________ Start time: ____________ Finish time: ____________  °Date: ____________ Movements: ____________ Start time: ____________ Finish time: ____________ °Date: ____________ Movements: ____________ Start time: ____________ Finish  time: ____________ °Date: ____________ Movements: ____________ Start time: ____________ Finish time: ____________ °Date: ____________ Movements: ____________ Start time: ____________ Finish time: ____________ °Date: ____________ Movements: ____________ Start time: ____________ Finish time: ____________ °Date: ____________ Movements: ____________ Start time: ____________ Finish time: ____________ °Date: ____________ Movements: ____________ Start time: ____________ Finish time: ____________  °Date: ____________ Movements: ____________ Start time: ____________ Finish   time: ____________ Date: ____________ Movements: ____________ Start time: ____________ Taylor MartinFinish time: ____________ Date: ____________ Movements: ____________ Start time: ____________ Taylor MartinFinish time: ____________ Date: ____________ Movements: ____________ Start time: ____________ Taylor MartinFinish time: ____________ Date: ____________ Movements: ____________ Start time: ____________ Taylor MartinFinish time: ____________ Date: ____________ Movements: ____________ Start time: ____________ Taylor MartinFinish time: ____________ Date: ____________ Movements: ____________ Start time: ____________ Taylor MartinFinish time: ____________  Date: ____________ Movements: ____________ Start time: ____________ Taylor MartinFinish time: ____________ Date: ____________ Movements: ____________ Start time: ____________ Taylor MartinFinish time: ____________ Date: ____________ Movements: ____________ Start time: ____________ Taylor MartinFinish time: ____________ Date: ____________ Movements: ____________ Start time: ____________ Taylor MartinFinish time: ____________ Date: ____________ Movements: ____________ Start time: ____________ Taylor MartinFinish time: ____________ Date: ____________ Movements: ____________ Start time: ____________ Taylor MartinFinish time: ____________ Date: ____________ Movements: ____________ Start time: ____________ Taylor MartinFinish time: ____________  Date: ____________ Movements: ____________ Start time: ____________ Taylor MartinFinish time: ____________ Date: ____________  Movements: ____________ Start time: ____________ Taylor MartinFinish time: ____________ Date: ____________ Movements: ____________ Start time: ____________ Taylor MartinFinish time: ____________ Date: ____________ Movements: ____________ Start time: ____________ Taylor MartinFinish time: ____________ Date: ____________ Movements: ____________ Start time: ____________ Taylor MartinFinish time: ____________ Date: ____________ Movements: ____________ Start time: ____________ Taylor MartinFinish time: ____________ Date: ____________ Movements: ____________ Start time: ____________ Taylor MartinFinish time: ____________  Date: ____________ Movements: ____________ Start time: ____________ Taylor MartinFinish time: ____________ Date: ____________ Movements: ____________ Start time: ____________ Taylor MartinFinish time: ____________ Date: ____________ Movements: ____________ Start time: ____________ Taylor MartinFinish time: ____________ Date: ____________ Movements: ____________ Start time: ____________ Taylor MartinFinish time: ____________ Date: ____________ Movements: ____________ Start time: ____________ Taylor MartinFinish time: ____________ Date: ____________ Movements: ____________ Start time: ____________ Taylor MartinFinish time: ____________   This information is not intended to replace advice given to you by your health care provider. Make sure you discuss any questions you have with your health care provider.   Document Released: 01/19/2006 Document Revised: 01/10/2014 Document Reviewed: 10/17/2011 Elsevier Interactive Patient Education 2016 [employer]  Preeclampsia and Eclampsia Preeclampsia is a serious condition that develops only during pregnancy. It is also called toxemia of pregnancy. This condition causes high blood pressure along with other symptoms, such as swelling and headaches. These may develop as the condition gets worse. Preeclampsia may occur 20 weeks or later into your pregnancy.  Diagnosing and treating preeclampsia early is very important. If not treated early, it can cause serious problems for you and your baby. One  problem it can lead to is eclampsia, which is a condition that causes muscle jerking or shaking (convulsions) in the mother. Delivering your baby is the best treatment for preeclampsia or eclampsia.  RISK FACTORS The cause of preeclampsia is not known. You may be more likely to develop preeclampsia if you have certain risk factors. These include:   Being pregnant for the first time.  Having preeclampsia in a past pregnancy.  Having a family history of preeclampsia.  Having high blood pressure.  Being pregnant with twins or triplets.  Being 7735 or older.  Being African American.  Having kidney disease or diabetes.  Having medical conditions such as lupus or blood diseases.  Being very overweight (obese). SIGNS AND SYMPTOMS  The earliest signs of preeclampsia are:  High blood pressure.  Increased protein in your urine. Your health care provider will check for this at every prenatal visit. Other symptoms that can develop include:   Severe headaches.  Sudden weight gain.  Swelling of your hands, face, legs, and feet.  Feeling sick to your stomach (nauseous) and throwing up (vomiting).  Vision problems (blurred or double vision).  Numbness in your face, arms, legs, and feet.  Dizziness.  Slurred speech.  Sensitivity to bright lights.  Abdominal pain. DIAGNOSIS  There are no screening tests for preeclampsia. Your health care provider will ask you about symptoms and check for signs of preeclampsia during your prenatal visits. You may also have tests, including:  Urine testing.  Blood testing.  Checking your baby's heart rate.  Checking the health of your baby and your placenta using images created with sound waves (ultrasound). TREATMENT  You can work out the best treatment approach together with your health care provider. It is very important to keep all prenatal appointments. If you have an increased risk of preeclampsia, you may need more frequent prenatal  exams.  Your health care provider may prescribe bed rest.  You may have to eat as little salt as possible.  You may need to take medicine to lower your blood pressure if the condition does not respond to more conservative measures.  You may need to stay in the hospital if your condition is severe. There, treatment will focus on controlling your blood pressure and fluid retention. You may also need to take medicine to prevent seizures.  If the condition gets worse, your baby may need to be delivered early to protect you and the baby. You may have your labor started with medicine (be induced), or you may have a cesarean delivery.  Preeclampsia usually goes away after the baby is born. HOME CARE INSTRUCTIONS   Only take over-the-counter or prescription medicines as directed by your health care provider.  Lie on your left side while resting. This keeps pressure off your baby.  Elevate your feet while resting.  Get regular exercise. Ask your health care provider what type of exercise is safe for you.  Avoid caffeine and alcohol.  Do not smoke.  Drink 6-8 glasses of water every day.  Eat a balanced diet that is low in salt. Do not add salt to your food.  Avoid stressful situations as much as possible.  Get plenty of rest and sleep.  Keep all prenatal appointments and tests as scheduled. SEEK MEDICAL CARE IF:  You are gaining more weight than expected.  You have any headaches, abdominal pain, or nausea.  You are bruising more than usual.  You feel dizzy or light-headed. SEEK IMMEDIATE MEDICAL CARE IF:   You develop sudden or severe swelling anywhere in your body. This usually happens in the legs.  You gain 5 lb (2.3 kg) or more in a week.  You have a severe headache, dizziness, problems with your vision, or confusion.  You have severe abdominal pain.  You have lasting nausea or vomiting.  You have a seizure.  You have trouble moving any part of your body.  You  develop numbness in your body.  You have trouble speaking.  You have any abnormal bleeding.  You develop a stiff neck.  You pass out. MAKE SURE YOU:   Understand these instructions.  Will watch your condition.  Will get help right away if you are not doing well or get worse.   This information is not intended to replace advice given to you by your health care provider. Make sure you discuss any questions you have with your health care provider.   Document Released: 12/18/1999 Document Revised: 12/25/2012 Document Reviewed: 10/12/2012 Elsevier Interactive Patient Education [employer]

## 2015-06-18 NOTE — Progress Notes (Signed)
Assumed care of patient. Patient appears in semifowlers c/o decreased fetal movement. FHR is tracing with baseline at 125 with 10x10 accel. Patient denies contractions, leaking fluid, or vaginal bleeding. The resident has been requested to evaluate. Carmelina DaneERRI L Imre Vecchione, RN

## 2015-06-18 NOTE — MAU Note (Signed)
Pt c/o decrease in FM. States she last felt baby move last night. Tried eating, drinking and rubbing belly. Some irregular contractions. Denies vag bleeding or LOF.

## 2015-06-18 NOTE — MAU Note (Signed)
PT  SAYS   SHE FELT    LAST MOVEMENT  OF  BABY  AT 2300.      SHE USUALLY FEELS  BABY MOVE  WHILE  ASLEEP -  THEN  AWOKE  AT 0500- NO MOVEMENT  -      [facility]   [facility]    [facility] -  1  CM   .    DENIES   HSV AND MRSA.    GBS- NEG

## 2015-06-22 ENCOUNTER — Inpatient Hospital Stay (HOSPITAL_COMMUNITY)
Admission: RE | Admit: 2015-06-22 | Discharge: 2015-06-24 | DRG: 775 | Disposition: A | Discharge status: Home / Self Care | Payer: Medicaid Other | Source: Ambulatory Visit | Attending: Obstetrics & Gynecology | Admitting: Obstetrics & Gynecology

## 2015-06-22 ENCOUNTER — Inpatient Hospital Stay (HOSPITAL_COMMUNITY): Payer: Medicaid Other | Admitting: Anesthesiology

## 2015-06-22 ENCOUNTER — Encounter (HOSPITAL_COMMUNITY): Payer: Self-pay

## 2015-06-22 VITALS — BP 119/68 | HR 64 | Temp 98.3°F | Resp 18 | Ht 68.0 in | Wt 248.0 lb

## 2015-06-22 DIAGNOSIS — O0993 Supervision of high risk pregnancy, unspecified, third trimester: Secondary | ICD-10-CM

## 2015-06-22 DIAGNOSIS — D5720 Sickle-cell/Hb-C disease without crisis: Secondary | ICD-10-CM | POA: Diagnosis present

## 2015-06-22 DIAGNOSIS — Z3A40 40 weeks gestation of pregnancy: Secondary | ICD-10-CM

## 2015-06-22 DIAGNOSIS — O09529 Supervision of elderly multigravida, unspecified trimester: Secondary | ICD-10-CM

## 2015-06-22 DIAGNOSIS — O09523 Supervision of elderly multigravida, third trimester: Secondary | ICD-10-CM

## 2015-06-22 DIAGNOSIS — D571 Sickle-cell disease without crisis: Secondary | ICD-10-CM

## 2015-06-22 DIAGNOSIS — O99019 Anemia complicating pregnancy, unspecified trimester: Secondary | ICD-10-CM

## 2015-06-22 DIAGNOSIS — O9902 Anemia complicating childbirth: Secondary | ICD-10-CM

## 2015-06-22 DIAGNOSIS — D5780 Other sickle-cell disorders without crisis: Secondary | ICD-10-CM

## 2015-06-22 DIAGNOSIS — O480 Post-term pregnancy: Secondary | ICD-10-CM | POA: Diagnosis present

## 2015-06-22 LAB — CBC
HCT: 26.3 % — ABNORMAL LOW (ref 36.0–46.0)
Hemoglobin: 9.4 g/dL — ABNORMAL LOW (ref 12.0–15.0)
MCH: 29.1 pg (ref 26.0–34.0)
MCHC: 35.7 g/dL (ref 30.0–36.0)
MCV: 81.4 fL (ref 78.0–100.0)
PLATELETS: 212 10*3/uL (ref 150–400)
RBC: 3.23 MIL/uL — AB (ref 3.87–5.11)
RDW: 18.2 % — AB (ref 11.5–15.5)
WBC: 8.1 10*3/uL (ref 4.0–10.5)

## 2015-06-22 LAB — RPR: RPR: NONREACTIVE

## 2015-06-22 LAB — TYPE AND SCREEN
ABO/RH(D): O POS
Antibody Screen: NEGATIVE

## 2015-06-22 MED ORDER — MISOPROSTOL 200 MCG PO TABS: 1000.0000 ug | ORAL_TABLET | Freq: Once | ORAL | Status: DC

## 2015-06-22 MED ORDER — TERBUTALINE SULFATE 1 MG/ML IJ SOLN
0.2500 mg | Freq: Once | INTRAMUSCULAR | Status: DC | PRN
  Filled 2015-06-22: qty 1

## 2015-06-22 MED ORDER — LACTATED RINGERS IV SOLN
500.0000 mL | INTRAVENOUS | Status: DC | PRN
  Administered 2015-06-22: 250 mL via INTRAVENOUS

## 2015-06-22 MED ORDER — SOD CITRATE-CITRIC ACID 500-334 MG/5ML PO SOLN: 30.0000 mL | ORAL | Status: DC | PRN

## 2015-06-22 MED ORDER — ACETAMINOPHEN 325 MG PO TABS: 650.0000 mg | ORAL_TABLET | ORAL | Status: DC | PRN

## 2015-06-22 MED ORDER — OXYCODONE-ACETAMINOPHEN 5-325 MG PO TABS: 1.0000 | ORAL_TABLET | ORAL | Status: DC | PRN

## 2015-06-22 MED ORDER — LACTATED RINGERS IV SOLN
INTRAVENOUS | Status: DC
  Administered 2015-06-22 (×2): via INTRAVENOUS

## 2015-06-22 MED ORDER — FENTANYL 2.5 MCG/ML BUPIVACAINE 1/10 % EPIDURAL INFUSION (WH - ANES)
14.0000 mL/h | INTRAMUSCULAR | Status: DC | PRN
  Administered 2015-06-22: 14 mL/h via EPIDURAL
  Filled 2015-06-22: qty 125

## 2015-06-22 MED ORDER — IBUPROFEN 600 MG PO TABS
600.0000 mg | ORAL_TABLET | Freq: Four times a day (QID) | ORAL | Status: DC
  Administered 2015-06-22 – 2015-06-24 (×7): 600 mg via ORAL
  Filled 2015-06-22 (×7): qty 1

## 2015-06-22 MED ORDER — FENTANYL CITRATE (PF) 100 MCG/2ML IJ SOLN
100.0000 ug | INTRAMUSCULAR | Status: DC | PRN
  Administered 2015-06-22: 100 ug via INTRAVENOUS
  Filled 2015-06-22: qty 2

## 2015-06-22 MED ORDER — DIPHENHYDRAMINE HCL 50 MG/ML IJ SOLN: 12.5000 mg | INTRAMUSCULAR | Status: DC | PRN

## 2015-06-22 MED ORDER — EPHEDRINE 5 MG/ML INJ
10.0000 mg | INTRAVENOUS | Status: DC | PRN
  Filled 2015-06-22: qty 2

## 2015-06-22 MED ORDER — MISOPROSTOL 25 MCG QUARTER TABLET
25.0000 ug | ORAL_TABLET | ORAL | Status: DC | PRN
  Administered 2015-06-22 (×2): 25 ug via VAGINAL
  Filled 2015-06-22: qty 0.25
  Filled 2015-06-22: qty 1
  Filled 2015-06-22: qty 0.25

## 2015-06-22 MED ORDER — FLEET ENEMA 7-19 GM/118ML RE ENEM: 1.0000 | ENEMA | RECTAL | Status: DC | PRN

## 2015-06-22 MED ORDER — MISOPROSTOL 200 MCG PO TABS
ORAL_TABLET | ORAL | Status: DC
Start: 2015-06-22 — End: 2015-06-23
  Filled 2015-06-22: qty 5

## 2015-06-22 MED ORDER — ONDANSETRON HCL 4 MG/2ML IJ SOLN: 4.0000 mg | Freq: Four times a day (QID) | INTRAMUSCULAR | Status: DC | PRN

## 2015-06-22 MED ORDER — PHENYLEPHRINE 40 MCG/ML (10ML) SYRINGE FOR IV PUSH (FOR BLOOD PRESSURE SUPPORT)
80.0000 ug | PREFILLED_SYRINGE | INTRAVENOUS | Status: DC | PRN
  Filled 2015-06-22: qty 5
  Filled 2015-06-22: qty 10

## 2015-06-22 MED ORDER — LACTATED RINGERS IV SOLN
500.0000 mL | Freq: Once | INTRAVENOUS | Status: AC
  Administered 2015-06-22: 500 mL via INTRAVENOUS

## 2015-06-22 MED ORDER — LIDOCAINE HCL (PF) 1 % IJ SOLN
30.0000 mL | INTRAMUSCULAR | Status: DC | PRN
  Administered 2015-06-22: 30 mL via SUBCUTANEOUS
  Filled 2015-06-22: qty 30

## 2015-06-22 MED ORDER — OXYTOCIN 40 UNITS IN LACTATED RINGERS INFUSION - SIMPLE MED
2.5000 [IU]/h | INTRAVENOUS | Status: DC
  Filled 2015-06-22: qty 1000

## 2015-06-22 MED ORDER — OXYCODONE-ACETAMINOPHEN 5-325 MG PO TABS: 2.0000 | ORAL_TABLET | ORAL | Status: DC | PRN

## 2015-06-22 MED ORDER — LIDOCAINE HCL (PF) 1 % IJ SOLN
INTRAMUSCULAR | Status: DC | PRN
  Administered 2015-06-22 (×2): 4 mL via EPIDURAL

## 2015-06-22 MED ORDER — OXYTOCIN BOLUS FROM INFUSION
500.0000 mL | INTRAVENOUS | Status: DC
  Administered 2015-06-22: 500 mL via INTRAVENOUS

## 2015-06-22 NOTE — H&P (Signed)
LABOR ADMISSION HISTORY AND PHYSICAL  Taylor Ravelingmina Roman is a 35 y.o. female [phone] with IUP at 9173w0d by 7w US presenting for IOL for [place], AMA. She reports mild irregular contraction. Fetal movement present and at baseline. Denies fluid leak or gush, vaginal bleeding,  headaches, blurry vision, RUQ pain or peripheral edema. She plans on breat and bottle feeding. She request ?Nex for birth control.  [facility]. Pain: wants to try natural.   Dating: By 7w US --->  Estimated Date of Delivery: 06/22/15  Sono:    @[redacted]w[redacted]d , CWD, normal anatomy, cephalic presentation, long lie, 3396 g, 90% EFW   Prenatal History/Complications: [facility] P[facility] Labs  Dating  7 wk ultrasound Blood type: O/POS/-- (11/17 0948) O pos  Genetic Screen Quad: ordered after genetic counseling  negavtive  NIPS: Antibody:NEG (11/17 0948)neg  Anatomic US 18 wks; bilat echogenic foci, otherwise normal Rubella: 6.11 (01/12 1151)immune  GTT Early:  130             Third trimester: 113 RPR: NON REAC (04/03 1109) NR  Flu vaccine 01/15/15 HBsAg: NEGATIVE (11/17 0948) Neg  TDaP vaccine   03/23/15                                            HIV: NONREACTIVE (04/03 1109)   Baby Food  Breast                                             JXB:JYNWGNFAGBS:Negative (05/15 0000)neg  Contraception Depo Provera/Nex Pap: Colpo July 2016 (LSIL)  Circumcision Yes   Pediatrician  [facility]   [facility]    Past Medical History: Past Medical History  Diagnosis Date  . Anemia   . Sickle cell trait (HCC)   . Headache   . Vaginal Pap smear, abnormal   . Sickle cell anemia (HCC)     Past Surgical History: Past Surgical History  Procedure Laterality Date  . No past surgeries      Obstetrical History: OB History    Gravida Para Term Preterm AB TAB SAB Ectopic Multiple Living   4 3 3   0  0   2      Social History: Social History   Social History  . Marital Status: Single     Spouse Name: N/A  . Number of Children: N/A  . Years of Education: N/A   Social History Main Topics  . Smoking status: Never Smoker   . Smokeless tobacco: Never Used  . Alcohol Use: No  . Drug Use: No  . Sexual Activity: Yes    Birth Control/ Protection: None   Other Topics Concern  . None   Social History Narrative    Family History: Family History  Problem Relation Age of Onset  . Anesthesia problems Neg Hx     Allergies: No Known Allergies  Prescriptions prior to admission  Medication Sig Dispense Refill Last Dose  . aspirin EC 81 MG tablet Take 1 tablet (81 mg total) by mouth daily. Take after 12 weeks for prevention of preeclampsia later in pregnancy (Patient not taking: Reported on 06/18/2015) 300 tablet 2 06/15/2015  . ferrous sulfate 325 (65 FE) MG tablet  Take 1 tablet (325 mg total) by mouth 2 (two) times daily with a meal. 60 tablet 3 06/17/2015 at Unknown time  . folic acid (FOLVITE) 1 MG tablet Take 2 tablets (2 mg total) by mouth daily. 60 tablet 10 06/17/2015 at Unknown time  . Prenatal Multivit-Min-Fe-FA (PRENATAL VITAMINS) 0.8 MG tablet Take 1 tablet by mouth daily. 30 tablet 12 06/17/2015 at Unknown time     Review of Systems  Blood pressure 134/74, pulse 67, resp. rate 18, height  (1.727 m), weight 248 lb (112.492 kg), last menstrual period 09/15/2014. GEN: appearance: alert, cooperative and appears stated age RESP: clear to auscultation bilaterally, no increased WOB CVS:: regular rate and rhythm, no murmurs, no sign of DVT, +2 DP GI: soft, non-tender; bowel sounds normal MSK: WWP, Homans sign is negative  Pelvic Exam: Cervical exam: Dilation: Fingertip Effacement (%): Thick Station: -2 Exam by:: Dr. Alanda Slim MD Presentation: cephalic Uterine activityNone  Fetal monitoringBaseline: 140 bpm, Variability: Good {> 6 bpm), Accelerations: Reactive and Decelerations: Absent  Prenatal labs: ABO, Rh: --/--/O POS (06/19 0815) Antibody: NEG (06/19  0815) Rubella: immune RPR: NON REAC (04/03 1109)  HBsAg: NEGATIVE (11/17 0948)  HIV: NONREACTIVE (04/03 1109)  GBS: Negative (05/15 0000)  1 hr Glucola negative Genetic screening  QUAD negative Anatomy US 18 wks [place]; bilat echogenic foci, otherwise normal  Prenatal Transfer Tool  Maternal Diabetes: No Genetic Screening: Normal Maternal Ultrasounds/Referrals: Normal Fetal Ultrasounds or other Referrals:  None Maternal Substance Abuse:  No Significant Maternal Medications:  None Significant Maternal Lab Results: None  Results for orders placed or performed during the hospital encounter of 06/22/15 (from the past 24 hour(s))  CBC   Collection Time: 06/22/15  8:15 AM  Result Value Ref Range   WBC 8.1 4.0 - 10.5 K/uL   RBC 3.23 (L) 3.87 - 5.11 MIL/uL   Hemoglobin 9.4 (L) 12.0 - 15.0 g/dL   HCT 16.1 (L) 09.6 - 04.5 %   MCV 81.4 78.0 - 100.0 fL   MCH 29.1 26.0 - 34.0 pg   MCHC 35.7 30.0 - 36.0 g/dL   RDW 40.9 (H) 81.1 - 91.4 %   Platelets 212 150 - 400 K/uL  Type and screen [facility]   Collection Time: 06/22/15  8:15 AM  Result Value Ref Range   ABO/RH(D) O POS    Antibody Screen NEG    Sample Expiration 06/25/2015     Patient Active Problem List   Diagnosis Date Noted  . Post term pregnancy over 40 weeks 06/22/2015  . Maternal sickle cell anemia affecting pregnancy, antepartum (HCC) 02/19/2015  . Advanced maternal age in multigravida 12/16/2014  . Supervision of high risk pregnancy, antepartum 11/20/2014  . 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 [id number] at [redacted]w[redacted]d here for IOL for [place], AMA   #Labor: IOL with cytotec #Pain: discussed options. Not in significant pain now #FWB:  CAT-1 #ID: GBS negative #MOF: Breast and bottle #MOC: likely Nexplanon. Hasn't decided yet #Circ: outpatient  ([facility])  Almon Hercules 06/22/2015, 11:35 AM   Midwife attestation: I have seen and examined this patient; I agree with above documentation in the resident's note.   Taylor Roman is a 35 y.o. [id number] here for IOL  PE: BP 134/74 mmHg  Pulse 67  Resp 18  Ht  (1.727 m)  Wt 248  lb (112.492 kg)  BMI 37.72 kg/m2  LMP 09/15/2014 (Exact Date) Gen: calm comfortable, NAD Resp: normal effort, no distress Abd: gravid  ROS, labs, PMH reviewed  Plan: Admit to LD Labor: IOL FWB: Cat I ID: GBS neg  Donette Larry, CNM  06/22/2015, 11:42 AM

## 2015-06-22 NOTE — Progress Notes (Signed)
Labor Progress Note Taylor Roman is a 35 y.o. [id number] at 5263w0d presented for IOL for [place] disease and AMA S: reports feeling more contractions but not at a level where she needs pain med now.  O:  BP 128/72 mmHg  Pulse 67  Temp(Src) 98.2 F (36.8 C) (Oral)  Resp 18  Ht 5\' 8"  (1.727 m)  Wt 248 lb (112.492 kg)  BMI 37.72 kg/m2  LMP 09/15/2014 (Exact Date) EFM: 140/mod var/+accels/had a couple of late decels far apart earlier  CVE: Dilation: Fingertip Effacement (%): Thick Cervical Position: Posterior Station: -3 Presentation: Vertex Exam by:: A.Davis,RN   A&P: 35 y.o. [id number] 7063w0d IOL for [place] disease  #Labor: feels contractions now. Continue Cytotec #Pain: bearable now #FWB: CAT-2 Had a couple of decels earlier but not anymore. Had good acels and mod variability. Overall reassuring. #GBS: neg  [name], MD 5:29 PM

## 2015-06-22 NOTE — Anesthesia Procedure Notes (Signed)
Epidural Patient location during procedure: OB Start time: 06/22/2015 9:09 PM  Staffing Anesthesiologist: Mal AmabileFOSTER, Koree Schopf  Preanesthetic Checklist Completed: patient identified, site marked, surgical consent, pre-op evaluation, timeout performed, IV checked, risks and benefits discussed and monitors and equipment checked  Epidural Patient position: sitting Prep: site prepped and draped and DuraPrep Patient monitoring: continuous pulse ox and blood pressure Approach: midline Location: L3-L4 Injection technique: LOR air  Needle:  Needle type: Tuohy  Needle gauge: 17 G Needle length: 9 cm and 9 Needle insertion depth: 6 cm Catheter type: closed end flexible Catheter size: 19 Gauge Catheter at skin depth: 11 cm Test dose: negative and Other  Assessment Events: blood not aspirated, injection not painful, no injection resistance, negative IV test and no paresthesia  Additional Notes Patient identified. Risks and benefits discussed including failed block, incomplete  Pain control, post dural puncture headache, nerve damage, paralysis, blood pressure Changes, nausea, vomiting, reactions to medications-both toxic and allergic and post Partum back pain. All questions were answered. Patient expressed understanding and wished to proceed. Sterile technique was used throughout procedure. Epidural site was Dressed with sterile barrier dressing. No paresthesias, signs of intravascular injection Or signs of intrathecal spread were encountered.  Patient was more comfortable after the epidural was dosed. Please see RN's note for documentation of vital signs and FHR which are stable.

## 2015-06-22 NOTE — Anesthesia Preprocedure Evaluation (Signed)
Anesthesia Evaluation  Patient identified by MRN, date of birth, ID band Patient awake    Reviewed: Allergy & Precautions, NPO status , Patient's Chart, lab work & pertinent test results  Airway Mallampati: III  TM Distance: >3 FB Neck ROM: Full    Dental no notable dental hx. (+) Teeth Intact   Pulmonary neg pulmonary ROS,    Pulmonary exam normal breath sounds clear to auscultation       Cardiovascular negative cardio ROS Normal cardiovascular exam Rhythm:Regular Rate:Normal     Neuro/Psych  Headaches, negative psych ROS   GI/Hepatic Neg liver ROS,   Endo/Other  Obesity   Renal/GU negative Renal ROS  negative genitourinary   Musculoskeletal negative musculoskeletal ROS (+)   Abdominal (+) + obese,   Peds  Hematology  (+) Sickle cell anemia, Sickle cell trait and anemia , HbC disease   Anesthesia Other Findings   Reproductive/Obstetrics (+) Pregnancy                             Anesthesia Physical Anesthesia Plan  ASA: II  Anesthesia Plan: Epidural   Post-op Pain Management:    Induction:   Airway Management Planned: Natural Airway  Additional Equipment:   Intra-op Plan:   Post-operative Plan:   Informed Consent: I have reviewed the patients History and Physical, chart, labs and discussed the procedure including the risks, benefits and alternatives for the proposed anesthesia with the patient or authorized representative who has indicated his/her understanding and acceptance.     Plan Discussed with: Anesthesiologist  Anesthesia Plan Comments:         Anesthesia Quick Evaluation

## 2015-06-22 NOTE — Anesthesia Pain Management Evaluation Note (Signed)
  CR[facility] Pain Management Visit Note  Patient: Taylor Roman, 35 y.o., female  "Hello I am a [facility] of the anesthesia [facility]. We have an anesthesia team available at all times to provide care throughout the hospital, including epidural management and anesthesia for C-section. I don't know your plan for the delivery whether it a natural birth, water birth, IV sedation, nitrous supplementation, doula or epidural, but we want to meet your pain goals."   1.Was your pain managed to your expectations on prior hospitalizations?   Patient has had natural childbirth in the past.  2.What is your expectation for pain management during this hospitalization?     Epidural  3.How can we help you reach that goal? Epidural.  Record the patient's initial score and the patient's pain goal.   Pain: 4  Pain Goal: 6 [facility] wants you to be able to say your pain was always managed very well.  Taylor Roman L 06/22/2015

## 2015-06-23 ENCOUNTER — Encounter (HOSPITAL_COMMUNITY): Payer: Self-pay

## 2015-06-23 LAB — RETICULOCYTES
RBC.: 2.95 MIL/uL — AB (ref 3.87–5.11)
RETIC CT PCT: 7.2 % — AB (ref 0.4–3.1)
Retic Count, Absolute: 212.4 10*3/uL — ABNORMAL HIGH (ref 19.0–186.0)

## 2015-06-23 MED ORDER — OXYCODONE-ACETAMINOPHEN 5-325 MG PO TABS: 2.0000 | ORAL_TABLET | ORAL | Status: DC | PRN

## 2015-06-23 MED ORDER — DIPHENHYDRAMINE HCL 25 MG PO CAPS: 25.0000 mg | ORAL_CAPSULE | Freq: Four times a day (QID) | ORAL | Status: DC | PRN

## 2015-06-23 MED ORDER — PRENATAL MULTIVITAMIN CH
1.0000 | ORAL_TABLET | Freq: Every day | ORAL | Status: DC
Start: 2015-06-23 — End: 2015-06-24
  Administered 2015-06-23 – 2015-06-24 (×2): 1 via ORAL
  Filled 2015-06-23 (×2): qty 1

## 2015-06-23 MED ORDER — BENZOCAINE-MENTHOL 20-0.5 % EX AERO: 1.0000 "application " | INHALATION_SPRAY | CUTANEOUS | Status: DC | PRN

## 2015-06-23 MED ORDER — ACETAMINOPHEN 325 MG PO TABS: 650.0000 mg | ORAL_TABLET | ORAL | Status: DC | PRN

## 2015-06-23 MED ORDER — DIBUCAINE 1 % RE OINT: 1.0000 "application " | TOPICAL_OINTMENT | RECTAL | Status: DC | PRN

## 2015-06-23 MED ORDER — SIMETHICONE 80 MG PO CHEW: 80.0000 mg | CHEWABLE_TABLET | ORAL | Status: DC | PRN

## 2015-06-23 MED ORDER — ONDANSETRON HCL 4 MG PO TABS: 4.0000 mg | ORAL_TABLET | ORAL | Status: DC | PRN

## 2015-06-23 MED ORDER — COCONUT OIL OIL: 1.0000 "application " | TOPICAL_OIL | Status: DC | PRN

## 2015-06-23 MED ORDER — TETANUS-DIPHTH-ACELL PERTUSSIS 5-2.5-18.5 LF-MCG/0.5 IM SUSP: 0.5000 mL | Freq: Once | INTRAMUSCULAR | Status: DC

## 2015-06-23 MED ORDER — WITCH HAZEL-GLYCERIN EX PADS: 1.0000 "application " | MEDICATED_PAD | CUTANEOUS | Status: DC | PRN

## 2015-06-23 MED ORDER — ZOLPIDEM TARTRATE 5 MG PO TABS: 5.0000 mg | ORAL_TABLET | Freq: Every evening | ORAL | Status: DC | PRN

## 2015-06-23 MED ORDER — SENNOSIDES-DOCUSATE SODIUM 8.6-50 MG PO TABS
2.0000 | ORAL_TABLET | ORAL | Status: DC
  Administered 2015-06-23: 2 via ORAL
  Filled 2015-06-23: qty 2

## 2015-06-23 MED ORDER — OXYCODONE-ACETAMINOPHEN 5-325 MG PO TABS: 1.0000 | ORAL_TABLET | ORAL | Status: DC | PRN

## 2015-06-23 MED ORDER — ONDANSETRON HCL 4 MG/2ML IJ SOLN: 4.0000 mg | INTRAMUSCULAR | Status: DC | PRN

## 2015-06-23 NOTE — Progress Notes (Signed)
Post Partum Day 1 Subjective: no complaints, up ad lib, voiding and tolerating PO  Objective: Blood pressure 129/70, pulse 68, temperature 98.9 F (37.2 C), temperature source Oral, resp. rate 18, height 5\' 8"  (1.727 m), weight 248 lb (112.492 kg), last menstrual period 09/15/2014, SpO2 100 %, unknown if currently breastfeeding.  Physical Exam:  General: alert, cooperative, appears stated age and mildly obese Lochia: appropriate Uterine Fundus: firm Incision: NA DVT Evaluation: No evidence of DVT seen on physical exam.   Recent Labs  06/22/15 0815  HGB 9.4*  HCT 26.3*    Assessment/Plan: Plan for discharge tomorrow and Lactation consult   LOS: 1 day   Keygan Dumond 06/23/2015, 9:16 AM

## 2015-06-23 NOTE — Anesthesia Postprocedure Evaluation (Signed)
Anesthesia Post Note  Patient: Taylor Roman  Procedure(s) Performed: * No procedures listed *  Patient location during evaluation: Mother Baby Anesthesia Type: Epidural Level of consciousness: awake Pain management: pain level controlled Vital Signs Assessment: post-procedure vital signs reviewed and stable Respiratory status: spontaneous breathing Cardiovascular status: stable Postop Assessment: no backache, no headache, epidural receding and patient able to bend at knees Anesthetic complications: no     Last Vitals:  Filed Vitals:   06/23/15 0145 06/23/15 0517  BP: 126/64 129/70  Pulse: 65 68  Temp: 37.2 C 37.2 C  Resp: 18 18    Last Pain:  Filed Vitals:   06/23/15 0752  PainSc: 5    Pain Goal: Patients Stated Pain Goal: 5 (06/23/15 0145)               Edison PaceWILKERSON,Panayiota Larkin

## 2015-06-23 NOTE — Progress Notes (Signed)
Post Partum Day 1 Subjective:  Taylor Roman is a 35 y.o. [id number] 5165w0d s/p SVD after IOL for [place] disease.  No acute events overnight.  Pt denies problems with ambulating, voiding or po intake.  She denies nausea or vomiting.  Pain is well controlled.  Lochia Minimal.  Plan for birth control is oral progesterone-only contraceptive, Depo-Provera, vasectomy, bilateral tubal ligation, IUD, Ortho-Evra, natural family planning (NFP), diaphragm, NuvaRing vaginal inserts, cervical cap, or Nexplanon. She is not decided between the two..  Method of Feeding: breast and bottle  Objective: Blood pressure 129/70, pulse 68, temperature 98.9 F (37.2 C), temperature source Oral, resp. rate 18, height 5\' 8"  (1.727 m), weight 248 lb (112.492 kg), last menstrual period 09/15/2014, SpO2 100 %, unknown if currently breastfeeding.  Physical Exam:  General: alert, cooperative and no distress Lochia:normal flow Chest: normal WOB Heart: Regular rate Abdomen: +BS, soft, mild TTP (appropriate) Uterine Fundus: firm DVT Evaluation: No evidence of DVT seen on physical exam. Extremities: no edema   Recent Labs  06/22/15 0815  HGB 9.4*  HCT 26.3*    Assessment/Plan:  ASSESSMENT: Taylor Roman is a 35 y.o. [id number] 1565w0d s/p SVD after IOL for [place] disease. She is doing well  Plan for discharge tomorrow  Birth control talk today Continue routine PP care Breastfeeding support PRN  LOS: 1 day   Burk Hoctor T Mariluz Crespo 06/23/2015, 9:08 AM

## 2015-06-23 NOTE — Lactation Note (Signed)
This note was copied from a baby's chart. Lactation Consultation Note Experienced BF mom Breast/formula fed her 2 other children 5 yr and 173 yr old for 1 1/2 yrs. Mom stated this baby was sleepy and hasn't BF so she asked for formula. Gave formula, then was trying to BF when LC entered rm. Baby was sleeping soundly. Mom had her Lt. Nipple in baby's mouth. Mom has large pendulum breast and VERY LARGE nipples. Encouraged STS, hand expression, BF first. Mom encouraged to feed baby 8-12 times/24 hours and with feeding cues. Referred to Baby and Me Book in Breastfeeding section Pg. 22-23 for position options and Proper latch demonstration. WH/LC brochure given w/resources, support groups and LC services. Mom has WIC. Patient Name: Boy Earlie Ravelingmina Camey ZOXWR'UToday's Date: 06/23/2015 Reason for consult: Initial assessment   Maternal Data Has patient been taught Hand Expression?: Yes Does the patient have breastfeeding experience prior to this delivery?: Yes  Feeding Feeding Type: Formula Nipple Type: Slow - flow  LATCH Score/Interventions       Type of Nipple: Everted at rest and after stimulation  Comfort (Breast/Nipple): Soft / non-tender     Hold (Positioning): No assistance needed to correctly position infant at breast. Intervention(s): Breastfeeding basics reviewed;Support Pillows;Position options;Skin to skin     Lactation Tools Discussed/Used WIC Program: Yes   Consult Status Consult Status: Follow-up Date: 06/24/15 Follow-up type: In-patient    Marquavion Venhuizen, Diamond NickelLAURA G 06/23/2015, 5:50 AM

## 2015-06-24 MED ORDER — IBUPROFEN 600 MG PO TABS: 600.0000 mg | ORAL_TABLET | Freq: Four times a day (QID) | ORAL | Status: DC

## 2015-06-24 MED ORDER — ACETAMINOPHEN 325 MG PO TABS: 650.0000 mg | ORAL_TABLET | ORAL | Status: DC | PRN

## 2015-06-24 NOTE — Discharge Instructions (Signed)

## 2015-06-24 NOTE — Lactation Note (Signed)
This note was copied from a baby's chart. Lactation Consultation Note  Patient Name: Taylor Roman ONGEX'BToday's Date: 06/24/2015 Reason for consult: Follow-up assessment   Follow up with exp BF mom of 35 hour old infant. Infant with 4 BF of 10 minutes, 6 bottle feeds of 12-35 cc, 2 voids and 1 stool in 24 hours preceding this assessment. Infant weight 8 lb 12 oz with 2% weight loss since birth. LATCH scores 8 by bedside Rn and this LC.  Mom reports she has no milk. She had infant bundled and latched to left breast in cradle hold. Infant was intermittently suckling and sleeping. Removed him and unbundled him and he relatched easily with flanged lips and intermittent suckling, a few swallows were noted. Mom with large pendulous compressible breasts with large everted nipples. Colostrum was easily expressible. Mom able to hand express. Reviewed BF basics, awakening techniques and I/O. Reviewed awakening techniques and enc mom to massage breast with feeding. Enc mom to express colostrum and feed to infant instead of formula. Mom voiced understanding.   Reviewed all BF information in Taking Care of Baby and Me Booklet. Reviewed engorgement prevention/treatment with mom. She reports she has been engorged with older children and hand expressed as needed. She does not have a pump and declined a manual pump. [facility] and plans to call and make an appointment. Reviewed LC Brochure, enc mom to call with questions/concerns prn. Mom aware of OP services, BF Support Groups and LC phone #. Infant has f/u Ped appt tomorrow.    Maternal Data Reason for exclusion: Mother's choice to formula and breast feed on admission  Feeding Feeding Type: Breast Fed Nipple Type: Slow - flow  LATCH Score/Interventions Latch: Repeated attempts needed to sustain latch, nipple held in mouth throughout feeding, stimulation needed to elicit sucking reflex. Intervention(s): Skin to skin;Waking techniques Intervention(s):  Breast massage;Breast compression  Audible Swallowing: A few with stimulation  Type of Nipple: Everted at rest and after stimulation  Comfort (Breast/Nipple): Soft / non-tender     Hold (Positioning): No assistance needed to correctly position infant at breast. Intervention(s): Breastfeeding basics reviewed;Support Pillows;Position options;Skin to skin  LATCH Score: 8  Lactation Tools Discussed/Used WIC Program: Yes Pump Review: Milk Storage   Consult Status Consult Status: Complete Follow-up type: Call as needed    Ed BlalockSharon S Taffy Delconte 06/24/2015, 9:46 AM

## 2015-06-24 NOTE — Discharge Summary (Signed)
OB Discharge Summary     Patient Name: Taylor Roman DOB: 08-Aug-1980 MRN: [id number]  Date of admission: 06/22/2015 Delivering MD: Dorathy Kinsman   Date of discharge: 06/24/2015  Admitting diagnosis: INDUCTION Intrauterine pregnancy: [redacted]w[redacted]d     Secondary diagnosis:  Active Problems:   Sickle cell-hemoglobin C disease (HCC)   Advanced maternal age in multigravida   Post term pregnancy over 40 weeks   NSVD (normal spontaneous vaginal delivery)  Additional problems: none     Discharge diagnosis: Term Pregnancy Delivered                                                                                                Post partum procedures:none  Augmentation: Pitocin and Cytotec  Complications: None  Hospital course:  Induction of Labor With Vaginal Delivery   35 y.o. yo G4P4003 at [redacted]w[redacted]d was admitted to the hospital 06/22/2015 for induction of labor.  Indication for induction: [place] disease.  Patient had an uncomplicated labor course as follows: Membrane Rupture Time/Date: 9:28 PM ,06/22/2015   Intrapartum Procedures: Episiotomy: None [1]                                         Lacerations:  2nd degree [3]  Patient had delivery of a Viable infant.  Information for the patient's newborn:  Kianah, Harries [[id number]]  Delivery Method: Vag-Spont   06/22/2015  Details of delivery can be found in separate delivery note.  Patient had a routine postpartum course. Patient is discharged home 06/24/2015.   Physical exam  Filed Vitals:   06/23/15 0517 06/23/15 1330 06/23/15 1843 06/24/15 0533  BP: 129/70 116/61 124/68 119/68  Pulse: 68 66 69 64  Temp: 98.9 F (37.2 C) 98.3 F (36.8 C) 98.6 F (37 C) 98.3 F (36.8 C)  TempSrc: Oral Oral Oral Oral  Resp: Height:      Weight:      SpO2: 100%      General: alert, cooperative and no distress Lochia: appropriate Uterine Fundus: firm Incision: Dressing is clean, dry, and intact DVT Evaluation: No evidence of DVT seen on  physical exam. Labs: Lab Results  Component Value Date   WBC 8.1 06/22/2015   HGB 9.4* 06/22/2015   HCT 26.3* 06/22/2015   MCV 81.4 06/22/2015   PLT 212 06/22/2015   CMP Latest Ref Rng 05/13/2011  Glucose 70 - 99 mg/dL 89  BUN 6 - 23 mg/dL 5(L)  Creatinine 9.62 - 1.10 mg/dL 9.52  Sodium 841 - 324 mEq/L 132(L)  Potassium 3.5 - 5.1 mEq/L 3.8  Chloride 96 - 112 mEq/L 98  CO2 19 - 32 mEq/L 23  Calcium 8.4 - 10.5 mg/dL 9.6  Total Protein 6.0 - 8.3 g/dL 7.9  Total Bilirubin 0.3 - 1.2 mg/dL 0.7  Alkaline Phos 39 - 117 U/L 64  AST 0 - 37 U/L 14  ALT 0 - 35 U/L 10    Discharge instruction: per After Visit Summary and "Baby and Me  Booklet".  After visit meds:    Medication List    TAKE these medications        acetaminophen 325 MG tablet  Commonly known as:  TYLENOL  Take 2 tablets (650 mg total) by mouth every 4 (four) hours as needed (for pain scale < 4).     ferrous sulfate 325 (65 FE) MG tablet  Take 1 tablet (325 mg total) by mouth 2 (two) times daily with a meal.     folic acid 1 MG tablet  Commonly known as:  FOLVITE  Take 2 tablets (2 mg total) by mouth daily.     ibuprofen 600 MG tablet  Commonly known as:  ADVIL,MOTRIN  Take 1 tablet (600 mg total) by mouth every 6 (six) hours.     Prenatal Vitamins 0.8 MG tablet  Take 1 tablet by mouth daily.        Diet: routine diet  Activity: Advance as tolerated. Pelvic rest for 6 weeks.   Outpatient follow up:6 weeks Follow up Appt:Future Appointments Date Time Provider Department Center  07/27/2015 9:40 AM Dorathy KinsmanVirginia Smith, CNM [facility]   Follow-up Information    [facility]   Specialty:  Obstetrics and Gynecology   Why:  Postpartm check   Contact information:   [address] [place] [id number] [phone]      Postpartum contraception: Progesterone only pills  Newborn Data: Live born female  Birth Weight: 8 lb 14.7 oz (4046 g) APGAR: 8,  9  Baby Feeding: Bottle and Breast Disposition:home with mother   06/24/2015 Almon Herculesaye T Gonfa, MD   OB FELLOW DISCHARGE ATTESTATION  I have seen and examined this patient and agree with above documentation in the resident's note.   Silvano BilisNoah B Zeniyah Peaster, MD 11:45 AM

## 2015-07-27 ENCOUNTER — Encounter: Payer: Self-pay | Admitting: Advanced Practice Midwife

## 2015-07-27 ENCOUNTER — Ambulatory Visit (INDEPENDENT_AMBULATORY_CARE_PROVIDER_SITE_OTHER): Payer: Self-pay | Admitting: Advanced Practice Midwife

## 2015-07-27 DIAGNOSIS — Z30011 Encounter for initial prescription of contraceptive pills: Secondary | ICD-10-CM

## 2015-07-27 DIAGNOSIS — M5431 Sciatica, right side: Secondary | ICD-10-CM

## 2015-07-27 MED ORDER — IBUPROFEN 600 MG PO TABS: 600.0000 mg | ORAL_TABLET | Freq: Four times a day (QID) | ORAL | 2 refills | Status: DC

## 2015-07-27 MED ORDER — NORETHINDRONE 0.35 MG PO TABS: 1.0000 | ORAL_TABLET | Freq: Every day | ORAL | 12 refills | Status: DC

## 2015-07-27 NOTE — Progress Notes (Signed)
Subjective:     Taylor Roman is a 35 y.o. female who presents for a postpartum visit. She is 5 weeks postpartum following a spontaneous vaginal delivery. I have fully reviewed the prenatal and intrapartum course. The delivery was at 40 gestational weeks. Outcome: spontaneous vaginal delivery. Anesthesia: epidural. Postpartum course has been complicated by right low back pain radiating down right leg. Baby's course has been Uncomplicated. Baby is feeding by breast and formula Bleeding staining only. Bowel function is normal. Bladder function is normal. Patient is not sexually active. Contraception method is planning oral progesterone-only contraceptive. Postpartum depression screening: negative.  The following portions of the patient's history were reviewed and updated as appropriate: allergies, current medications, past family history, past medical history, past social history, past surgical history and problem list  Hx LSIL Pap. Normal Colpo 2016, but no Bx taken. Previous provider recommended repeat Colpo PP.   Review of Systems Pertinent items are noted in HPI.   Objective:    BP 113/73   Pulse 60   Ht 5\' 7"  (1.702 m)   Wt 219 lb (99.3 kg)   Breastfeeding? Yes   BMI 34.30 kg/m   General:  alert, cooperative, appears stated age and no distress   Breasts:  Declined  Lungs: clear to auscultation bilaterally  Heart:  regular rate and rhythm, S1, S2 normal, no murmur, click, rub or gallop  Abdomen: soft, non-tender; bowel sounds normal; no masses,  no organomegaly   Vulva:  normal and Healing well. Scant fragment of suture still present, partially disolved.   Vagina: normal vagina  Cervix:  no cervical motion tenderness  Corpus: normal size, contour, position, consistency, mobility, non-tender  Adnexa:  no mass, fullness, tenderness  Rectal Exam: Not performed.        Assessment:     Normal postpartum exam. Pap smear not done at today's visit.   Plan:    1. Contraception: oral  progesterone-only contraceptive 2. Colpo scheduled 3. Follow up in: 3 months or as needed.    [place], [place] 07/27/2015 12:24 PM

## 2015-07-27 NOTE — Patient Instructions (Signed)

## 2015-07-29 LAB — POCT PREGNANCY, URINE: Preg Test, Ur: NEGATIVE

## 2015-08-10 ENCOUNTER — Ambulatory Visit: Payer: Self-pay | Admitting: Family Medicine

## 2015-08-10 ENCOUNTER — Encounter: Payer: Self-pay | Admitting: Family Medicine

## 2015-08-10 VITALS — BP 117/75 | HR 67 | Wt 216.2 lb

## 2015-08-10 DIAGNOSIS — Z30019 Encounter for initial prescription of contraceptives, unspecified: Secondary | ICD-10-CM

## 2015-08-10 LAB — POCT PREGNANCY, URINE: Preg Test, Ur: NEGATIVE

## 2015-08-10 NOTE — Progress Notes (Signed)
Patient was not seen by physician today due to only needing BC change to Depo, [facility] due to financial concerns and coverage by insurance.

## 2015-08-10 NOTE — Progress Notes (Signed)
Here to today , on ocp's but wants to switch to depo-provera. She does not have insurance- only has financial assistance thru [facility] - will go to health department to get depo since she can get it cheaper there.

## 2015-08-25 ENCOUNTER — Encounter (HOSPITAL_COMMUNITY): Payer: Self-pay | Admitting: *Deleted

## 2015-09-17 ENCOUNTER — Encounter (HOSPITAL_COMMUNITY): Payer: Self-pay | Admitting: *Deleted

## 2015-09-17 ENCOUNTER — Ambulatory Visit (HOSPITAL_COMMUNITY)
Admission: RE | Admit: 2015-09-17 | Discharge: 2015-09-17 | Disposition: A | Discharge status: Home / Self Care | Payer: Self-pay | Source: Ambulatory Visit | Attending: Obstetrics and Gynecology | Admitting: Obstetrics and Gynecology

## 2015-09-17 VITALS — BP 112/70 | Temp 98.6°F | Ht 67.0 in | Wt 221.4 lb

## 2015-09-17 DIAGNOSIS — R87613 High grade squamous intraepithelial lesion on cytologic smear of cervix (HGSIL): Secondary | ICD-10-CM

## 2015-09-17 DIAGNOSIS — Z1239 Encounter for other screening for malignant neoplasm of breast: Secondary | ICD-10-CM

## 2015-09-17 NOTE — Patient Instructions (Addendum)
Explained breast self awareness to Taylor Roman. Explained to patient the colposcopy the needed follow up for her abnormal Pap smear. Answered patients questions. Referred patient to the Center for [facility] for a [facility]pos[facility]. Appointment scheduled for Wednesday, October 07, 2015 at 1020. Patient aware of appointment and will be there. Informed patient that a screening mammogram is recommended at age 35 unless clinically indicated prior. Taylor Roman verbalized understanding.  Taylor Roman, Taylor Maserhristine Poll, RN 9:20 AM

## 2015-09-17 NOTE — Progress Notes (Signed)
[facility] due to having an abnormal Pap smear 08/18/2015 that a colposcopy is recommended for follow up.  Pap Smear: Pap smear not completed today. [facility] with positive HPV. Referred patient to the [facility] for a colposcopy. Appointment scheduled for Wednesday, October 07, 2015 at 1020. Per patient her most recent Pap smear is the only abnormal Pap smear she has had. Last Pap smear result is in EPIC.  Physical exam: Breasts Breasts symmetrical. No skin abnormalities bilateral breasts. No nipple retraction bilateral breasts. No nipple discharge bilateral breasts. No lymphadenopathy. No lumps palpated bilateral breasts. No complaints of pain or tenderness on exam. Screening mammogram recommended at age 35 unless clinically indicated prior.       Pelvic/Bimanual No Pap smear completed today since last Pap smear was 08/18/2015. Pap smear not indicated per [facility] guidelines.   Smoking History: Patient has never smoked.  Patient Navigation: Patient education provided. Access to services provided for [facility] through [facility].

## 2015-09-21 ENCOUNTER — Encounter (HOSPITAL_COMMUNITY): Payer: Self-pay | Admitting: *Deleted

## 2015-10-06 ENCOUNTER — Encounter (HOSPITAL_COMMUNITY): Payer: Self-pay | Admitting: *Deleted

## 2015-10-07 ENCOUNTER — Encounter: Payer: Self-pay | Admitting: Obstetrics and Gynecology

## 2015-10-07 ENCOUNTER — Ambulatory Visit (INDEPENDENT_AMBULATORY_CARE_PROVIDER_SITE_OTHER): Payer: Self-pay | Admitting: Obstetrics and Gynecology

## 2015-10-07 ENCOUNTER — Other Ambulatory Visit (HOSPITAL_COMMUNITY)
Admission: RE | Admit: 2015-10-07 | Discharge: 2015-10-07 | Disposition: A | Discharge status: Home / Self Care | Payer: No Typology Code available for payment source | Source: Ambulatory Visit | Attending: Obstetrics and Gynecology | Admitting: Obstetrics and Gynecology

## 2015-10-07 VITALS — BP 118/72 | HR 57 | Wt 216.8 lb

## 2015-10-07 DIAGNOSIS — N870 Mild cervical dysplasia: Secondary | ICD-10-CM

## 2015-10-07 LAB — POCT PREGNANCY, URINE: PREG TEST UR: NEGATIVE

## 2015-10-07 NOTE — Procedures (Addendum)
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 No history of cervical surgeries or procedures  Procedure Details  LMP none ([facility] UPT negative. Patient also denies any tobacco use or 2nd hand smoke exposure.   The risks (including infection, bleeding, pain) and benefits of the procedure were explained to the patient and written informed consent was obtained.  The patient was placed in the dorsal lithotomy position. A Graves was speculum inserted in the vagina, and the cervix was visualized.  AA staining done Lugol's with green filter.  Biopsy done from 9-10 o'clock and then single toothed tenaculum applied and ECC in all four quadrants done. No bleeding after procedure after application of Monsel's  Findings: AWE changes were seen diffusely around the TZ and confirmed with Lugol's. Slightly increased vascularity and mosacisim seen at the biopsy site.   Adequate: Yes  Specimens: 10 o'clock biopsy and ECC  Condition: Stable  Complications: None  Plan: The patient was advised to call for any fever or for prolonged or severe pain or bleeding. She was advised to use OTC analgesics as needed for mild to moderate pain. She was advised to avoid vaginal intercourse for 48 hours or until the bleeding has completely stopped.   Cornelia Copaharlie Syeda Prickett, Jr MD Attending Center for [facility](Faculty Practice)

## 2015-10-12 ENCOUNTER — Telehealth: Payer: Self-pay | Admitting: Obstetrics and Gynecology

## 2015-10-12 NOTE — Telephone Encounter (Signed)
GYN Telephone Note 10/12/2015 Patient called @ [phone] and reviewed pathology. I told her that I recommend CKC in the OR given the CIN 2 on the ECC. Surgical risks reviewed with her, namely OB and bleeding and she is fine with proceeding and for close follow up postoperatively.   Will send message to office to set up time for sometime in the next month.  Cornelia Copaharlie Rosmarie Esquibel, Jr MD Attending Center for [facility](Faculty Practice)

## 2015-10-15 ENCOUNTER — Encounter (HOSPITAL_COMMUNITY): Payer: Self-pay | Admitting: *Deleted

## 2015-10-22 ENCOUNTER — Encounter (HOSPITAL_COMMUNITY): Payer: Self-pay | Admitting: *Deleted

## 2015-11-02 ENCOUNTER — Telehealth (HOSPITAL_COMMUNITY): Payer: Self-pay | Admitting: *Deleted

## 2015-11-02 ENCOUNTER — Encounter (HOSPITAL_COMMUNITY): Payer: Self-pay | Admitting: Anesthesiology

## 2015-11-02 NOTE — Telephone Encounter (Signed)
[facility]. Patient stated was not a citizen. Advised patient [facility] would not cover CKC and that financial assistance paperwork would need to be filled out. Patient voiced understanding.

## 2015-11-02 NOTE — Telephone Encounter (Signed)
Telephoned patient at home number and left message to return call to [facility] 

## 2015-11-03 ENCOUNTER — Ambulatory Visit (HOSPITAL_COMMUNITY)
Admission: RE | Admit: 2015-11-03 | Discharge: 2015-11-03 | Disposition: A | Discharge status: Home / Self Care | Payer: Self-pay | Source: Ambulatory Visit | Attending: Obstetrics and Gynecology | Admitting: Obstetrics and Gynecology

## 2015-11-03 ENCOUNTER — Ambulatory Visit (HOSPITAL_COMMUNITY): Payer: Self-pay | Admitting: Anesthesiology

## 2015-11-03 ENCOUNTER — Encounter (HOSPITAL_COMMUNITY): Payer: Self-pay | Admitting: *Deleted

## 2015-11-03 ENCOUNTER — Encounter (HOSPITAL_COMMUNITY)
Admission: RE | Disposition: A | Discharge status: Home / Self Care | Payer: Self-pay | Source: Ambulatory Visit | Attending: Obstetrics and Gynecology

## 2015-11-03 DIAGNOSIS — R87810 Cervical high risk human papillomavirus (HPV) DNA test positive: Secondary | ICD-10-CM

## 2015-11-03 DIAGNOSIS — Z6833 Body mass index (BMI) 33.0-33.9, adult: Secondary | ICD-10-CM | POA: Insufficient documentation

## 2015-11-03 DIAGNOSIS — N871 Moderate cervical dysplasia: Secondary | ICD-10-CM | POA: Insufficient documentation

## 2015-11-03 DIAGNOSIS — D573 Sickle-cell trait: Secondary | ICD-10-CM | POA: Insufficient documentation

## 2015-11-03 DIAGNOSIS — B977 Papillomavirus as the cause of diseases classified elsewhere: Secondary | ICD-10-CM

## 2015-11-03 DIAGNOSIS — E669 Obesity, unspecified: Secondary | ICD-10-CM | POA: Insufficient documentation

## 2015-11-03 DIAGNOSIS — D571 Sickle-cell disease without crisis: Secondary | ICD-10-CM | POA: Insufficient documentation

## 2015-11-03 HISTORY — PX: CERVICAL CONIZATION W/BX: SHX1330

## 2015-11-03 LAB — CBC
HEMATOCRIT: 25.7 % — AB (ref 36.0–46.0)
Hemoglobin: 9 g/dL — ABNORMAL LOW (ref 12.0–15.0)
MCH: 26.8 pg (ref 26.0–34.0)
MCHC: 35 g/dL (ref 30.0–36.0)
MCV: 76.5 fL — AB (ref 78.0–100.0)
Platelets: 213 10*3/uL (ref 150–400)
RBC: 3.36 MIL/uL — ABNORMAL LOW (ref 3.87–5.11)
RDW: 18.8 % — AB (ref 11.5–15.5)
WBC: 6.2 10*3/uL (ref 4.0–10.5)

## 2015-11-03 LAB — PREGNANCY, URINE: Preg Test, Ur: NEGATIVE

## 2015-11-03 SURGERY — CONE BIOPSY, CERVIX
Anesthesia: General | Site: Cervix

## 2015-11-03 MED ORDER — LIDOCAINE HCL (CARDIAC) 20 MG/ML IV SOLN
INTRAVENOUS | Status: DC | PRN
  Administered 2015-11-03: 80 mg via INTRAVENOUS

## 2015-11-03 MED ORDER — ONDANSETRON HCL 4 MG/2ML IJ SOLN
INTRAMUSCULAR | Status: AC
  Filled 2015-11-03: qty 2

## 2015-11-03 MED ORDER — ACETIC ACID 5 % SOLN
Status: AC
  Filled 2015-11-03: qty 500

## 2015-11-03 MED ORDER — FERRIC SUBSULFATE 259 MG/GM EX SOLN
CUTANEOUS | Status: DC | PRN
  Administered 2015-11-03: 1 via TOPICAL

## 2015-11-03 MED ORDER — OXYCODONE-ACETAMINOPHEN 5-325 MG PO TABS: 1.0000 | ORAL_TABLET | Freq: Four times a day (QID) | ORAL | 0 refills | Status: DC | PRN

## 2015-11-03 MED ORDER — OXYCODONE-ACETAMINOPHEN 5-325 MG PO TABS
ORAL_TABLET | ORAL | Status: AC
  Filled 2015-11-03: qty 1

## 2015-11-03 MED ORDER — PROMETHAZINE HCL 25 MG/ML IJ SOLN: 6.2500 mg | INTRAMUSCULAR | Status: DC | PRN

## 2015-11-03 MED ORDER — MIDAZOLAM HCL 2 MG/2ML IJ SOLN
INTRAMUSCULAR | Status: DC | PRN
  Administered 2015-11-03: 1 mg via INTRAVENOUS

## 2015-11-03 MED ORDER — LIDOCAINE HCL 2 % EX GEL
CUTANEOUS | Status: AC
  Filled 2015-11-03: qty 5

## 2015-11-03 MED ORDER — DEXAMETHASONE SODIUM PHOSPHATE 4 MG/ML IJ SOLN
INTRAMUSCULAR | Status: DC | PRN
  Administered 2015-11-03: 10 mg via INTRAVENOUS

## 2015-11-03 MED ORDER — LIDOCAINE-EPINEPHRINE 1 %-1:100000 IJ SOLN
INTRAMUSCULAR | Status: AC
  Filled 2015-11-03: qty 1

## 2015-11-03 MED ORDER — FENTANYL CITRATE (PF) 100 MCG/2ML IJ SOLN: 25.0000 ug | INTRAMUSCULAR | Status: DC | PRN

## 2015-11-03 MED ORDER — DEXAMETHASONE SODIUM PHOSPHATE 10 MG/ML IJ SOLN
INTRAMUSCULAR | Status: AC
  Filled 2015-11-03: qty 1

## 2015-11-03 MED ORDER — OXYCODONE-ACETAMINOPHEN 5-325 MG PO TABS
1.0000 | ORAL_TABLET | ORAL | Status: DC | PRN
  Administered 2015-11-03: 1 via ORAL

## 2015-11-03 MED ORDER — ONDANSETRON HCL 4 MG/2ML IJ SOLN
INTRAMUSCULAR | Status: DC | PRN
  Administered 2015-11-03: 4 mg via INTRAVENOUS

## 2015-11-03 MED ORDER — SCOPOLAMINE 1 MG/3DAYS TD PT72
1.0000 | MEDICATED_PATCH | Freq: Once | TRANSDERMAL | Status: DC
  Administered 2015-11-03: 1.5 mg via TRANSDERMAL

## 2015-11-03 MED ORDER — LIDOCAINE-EPINEPHRINE 1 %-1:100000 IJ SOLN
INTRAMUSCULAR | Status: DC | PRN
  Administered 2015-11-03: 10 mL

## 2015-11-03 MED ORDER — PROPOFOL 10 MG/ML IV BOLUS
INTRAVENOUS | Status: AC
  Filled 2015-11-03: qty 20

## 2015-11-03 MED ORDER — PROPOFOL 10 MG/ML IV BOLUS
INTRAVENOUS | Status: DC | PRN
  Administered 2015-11-03: 200 mg via INTRAVENOUS

## 2015-11-03 MED ORDER — LIDOCAINE HCL 1 % IJ SOLN
INTRAMUSCULAR | Status: AC
  Filled 2015-11-03: qty 20

## 2015-11-03 MED ORDER — FENTANYL CITRATE (PF) 100 MCG/2ML IJ SOLN
INTRAMUSCULAR | Status: AC
  Filled 2015-11-03: qty 4

## 2015-11-03 MED ORDER — MIDAZOLAM HCL 2 MG/2ML IJ SOLN
INTRAMUSCULAR | Status: AC
  Filled 2015-11-03: qty 2

## 2015-11-03 MED ORDER — GLYCOPYRROLATE 0.2 MG/ML IJ SOLN
INTRAMUSCULAR | Status: DC | PRN
  Administered 2015-11-03: 0.1 mg via INTRAVENOUS

## 2015-11-03 MED ORDER — SCOPOLAMINE 1 MG/3DAYS TD PT72
MEDICATED_PATCH | TRANSDERMAL | Status: AC
  Filled 2015-11-03: qty 1

## 2015-11-03 MED ORDER — LIDOCAINE HCL 1 % IJ SOLN: INTRAMUSCULAR | Status: DC | PRN

## 2015-11-03 MED ORDER — LIDOCAINE HCL (CARDIAC) 20 MG/ML IV SOLN
INTRAVENOUS | Status: AC
  Filled 2015-11-03: qty 5

## 2015-11-03 MED ORDER — FENTANYL CITRATE (PF) 250 MCG/5ML IJ SOLN
INTRAMUSCULAR | Status: DC | PRN
  Administered 2015-11-03 (×4): 50 ug via INTRAVENOUS

## 2015-11-03 MED ORDER — LACTATED RINGERS IV SOLN
INTRAVENOUS | Status: DC
  Administered 2015-11-03: 125 mL/h via INTRAVENOUS
  Administered 2015-11-03: 14:00:00 via INTRAVENOUS

## 2015-11-03 MED ORDER — KETOROLAC TROMETHAMINE 30 MG/ML IJ SOLN
INTRAMUSCULAR | Status: AC
  Filled 2015-11-03: qty 1

## 2015-11-03 MED ORDER — FERRIC SUBSULFATE 259 MG/GM EX SOLN
CUTANEOUS | Status: AC
  Filled 2015-11-03: qty 8

## 2015-11-03 MED ORDER — IODINE STRONG (LUGOLS) 5 % PO SOLN
ORAL | Status: AC
  Filled 2015-11-03: qty 1

## 2015-11-03 MED ORDER — KETOROLAC TROMETHAMINE 30 MG/ML IJ SOLN
INTRAMUSCULAR | Status: DC | PRN
  Administered 2015-11-03: 30 mg via INTRAVENOUS

## 2015-11-03 SURGICAL SUPPLY — 34 items
APPLICATOR COTTON TIP 6IN STRL (MISCELLANEOUS) ×3 IMPLANT
BLADE SURG 11 STRL SS (BLADE) ×3 IMPLANT
BLADE SURG 15 STRL LF C SS BP (BLADE) IMPLANT
BLADE SURG 15 STRL SS (BLADE)
CANISTER SUCT 3000ML (MISCELLANEOUS) ×3 IMPLANT
CATH ROBINSON RED A/P 16FR (CATHETERS) ×3 IMPLANT
CLOTH BEACON ORANGE TIMEOUT ST (SAFETY) ×3 IMPLANT
CONTAINER PREFILL 10% NBF 60ML (FORM) ×6 IMPLANT
ELECT REM PT RETURN 9FT ADLT (ELECTROSURGICAL) ×3
ELECTRODE REM PT RTRN 9FT ADLT (ELECTROSURGICAL) ×1 IMPLANT
GLOVE BIO SURGEON STRL SZ7 (GLOVE) ×3 IMPLANT
GLOVE INDICATOR 7.5 STRL GRN (GLOVE) ×3 IMPLANT
GOWN STRL REUS W/ TWL LRG LVL3 (GOWN DISPOSABLE) ×1 IMPLANT
GOWN STRL REUS W/ TWL XL LVL3 (GOWN DISPOSABLE) ×1 IMPLANT
GOWN STRL REUS W/TWL LRG LVL3 (GOWN DISPOSABLE) ×2
GOWN STRL REUS W/TWL XL LVL3 (GOWN DISPOSABLE) ×2
HEMOSTAT SURGICEL 4X8 (HEMOSTASIS) ×3 IMPLANT
NS IRRIG 1000ML POUR BTL (IV SOLUTION) ×3 IMPLANT
PACK VAGINAL MINOR WOMEN LF (CUSTOM PROCEDURE TRAY) ×3 IMPLANT
PAD OB MATERNITY 4.3X12.25 (PERSONAL CARE ITEMS) ×3 IMPLANT
PENCIL BUTTON HOLSTER BLD 10FT (ELECTRODE) ×3 IMPLANT
SCOPETTES 8  STERILE (MISCELLANEOUS) ×2
SCOPETTES 8 STERILE (MISCELLANEOUS) ×1 IMPLANT
SUT CHROMIC 1 CT1 27 (SUTURE) IMPLANT
SUT VIC AB 0 CT1 27 (SUTURE) ×2
SUT VIC AB 0 CT1 27XCR 8 STRN (SUTURE) ×1 IMPLANT
SUT VIC AB 1 CT1 36 (SUTURE) ×6 IMPLANT
SUT VIC AB 3-0 SH 27 (SUTURE) ×4
SUT VIC AB 3-0 SH 27X BRD (SUTURE) ×2 IMPLANT
TOWEL OR 17X24 6PK STRL BLUE (TOWEL DISPOSABLE) ×6 IMPLANT
TROCAR BALL TOP DISP 5MM (ENDOMECHANICALS) ×3 IMPLANT
TUBING NON-CON 1/4 X 20 CONN (TUBING) ×2 IMPLANT
TUBING NON-CON 1/4 X 20' CONN (TUBING) ×1
YANKAUER SUCT BULB TIP NO VENT (SUCTIONS) ×3 IMPLANT

## 2015-11-03 NOTE — Anesthesia Postprocedure Evaluation (Signed)
Anesthesia Post Note  Patient: Taylor Roman  Procedure(s) Performed: Procedure(s) (LRB): CONIZATION CERVIX WITH BIOPSY (N/A)  Patient location during evaluation: PACU Anesthesia Type: General Level of consciousness: awake and alert Pain management: pain level controlled Vital Signs Assessment: post-procedure vital signs reviewed and stable Respiratory status: spontaneous breathing, nonlabored ventilation, respiratory function stable and patient connected to nasal cannula oxygen Cardiovascular status: blood pressure returned to baseline and stable Postop Assessment: no signs of nausea or vomiting Anesthetic complications: no     Last Vitals:  Vitals:   11/03/15 1630 11/03/15 1730  BP: 124/82 132/78  Pulse: (!) 53 (!) 55  Resp: 14 16  Temp:  36.7 C    Last Pain:  Vitals:   11/03/15 1730  TempSrc:   PainSc: 4    Pain Goal: Patients Stated Pain Goal: 6 (11/03/15 1700)               Linton RumpJennifer Dickerson Liliana Brentlinger
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
## 2015-11-03 NOTE — Anesthesia Procedure Notes (Signed)
Procedure Name: LMA Insertion Date/Time: 11/03/2015 1:06 PM Performed by: Graciela HusbandsFUSSELL, Citlaly Camplin O Pre-anesthesia Checklist: Patient identified, Emergency Drugs available, Suction available, Patient being monitored and Timeout performed Patient Re-evaluated:Patient Re-evaluated prior to inductionOxygen Delivery Method: Circle system utilized Preoxygenation: Pre-oxygenation with 100% oxygen Intubation Type: IV induction LMA: LMA inserted LMA Size: 4.0 Number of attempts: 1 Placement Confirmation: positive ETCO2 and breath sounds checked- equal and bilateral Tube secured with: Tape Dental Injury: Teeth and Oropharynx as per pre-operative assessment

## 2015-11-03 NOTE — Discharge Instructions (Addendum)
It's normal if string and cloth come out of the vagina in the next few days to a week   We will discuss your surgery once again in detail at your post-op visit in two to four weeks. If you havent already done so, please call to make your appointment as soon as possible.   These instructions give you information on caring for yourself after your procedure. Your doctor may also give you more specific instructions. Call your doctor if you have any problems or questions after your procedure. HOME CARE  Do not drive for 24 hours.  Wait 1 week before doing any activities that wear you out.  Do not stand for a long time.  Limit stair climbing to once or twice a day.  Rest often.  Continue with your usual diet.  Drink enough fluids to keep your pee (urine) clear or pale yellow.  If you have a hard time pooping (constipation), you may:  Take a medicine to help you go poop (laxative) as told by your doctor.  Eat more fruit and bran.  Drink more fluids.  Take showers, not baths, for as long as told by your doctor.  Do not swim or use a hot tub until your doctor says it is okay.  Have someone with you for 1day after the procedure.  Do not douche, use tampons, or have sex (intercourse) until seen by your doctor  Only take medicines as told by your doctor. Do not take aspirin. It can cause bleeding.  Keep all doctor visits. GET HELP IF:  You have cramps or pain not helped by medicine.  You have new pain in the belly (abdomen).  You have a bad smelling fluid coming from your vagina.  You have a rash.  You have problems with any medicine. GET HELP RIGHT AWAY IF:   You start to bleed more than a regular period.  You have a fever.  You have chest pain.  You have trouble breathing.  You feel dizzy or feel like passing out (fainting).  You pass out.  You have pain in the tops of your shoulders.  You have vaginal bleeding with or without clumps of blood (blood  clots). MAKE SURE YOU:  Understand these instructions.  Will watch your condition.  Will get help right away if you are not doing well or get worse. Document Released: 09/29/2007 Document Revised: 12/25/2012 Document Reviewed: 07/19/2012 [facility] Patient Information 2015 [place] [place]. This information is not intended to replace advice given to you by your health care provider. Make sure you discuss any questions you have with your health care provider.    Post Anesthesia Home Care Instructions  Activity: Get plenty of rest for the remainder of the day. A responsible adult should stay with you for 24 hours following the procedure.  For the next 24 hours, DO NOT: -Drive a car -Advertising copywriterperate machinery -Drink alcoholic beverages -Take any medication unless instructed by your physician -Make any legal decisions or sign important papers.  Meals: Start with liquid foods such as gelatin or soup. Progress to regular foods as tolerated. Avoid greasy, spicy, heavy foods. If nausea and/or vomiting occur, drink only clear liquids until the nausea and/or vomiting subsides. Call your physician if vomiting continues.  Special Instructions/Symptoms: Your throat may feel dry or sore from the anesthesia or the breathing tube placed in your throat during surgery. If this causes discomfort, gargle with warm salt water. The discomfort should disappear within 24 hours.  If you had  a scopolamine patch placed behind your ear for the management of post- operative nausea and/or vomiting:  1. The medication in the patch is effective for 72 hours, after which it should be removed.  Wrap patch in a tissue and discard in the trash. Wash hands thoroughly with soap and water. 2. You may remove the patch earlier than 72 hours if you experience unpleasant side effects which may include dry mouth, dizziness or visual disturbances. 3. Avoid touching the patch. Wash your hands with soap and water after contact with the  patch.  Do not take ibuprofen/Motrin/Advil products until 8pm tonight 11/03/15.

## 2015-11-03 NOTE — Anesthesia Preprocedure Evaluation (Addendum)
Anesthesia Evaluation  Patient identified by MRN, date of birth, ID band Patient awake    Reviewed: Allergy & Precautions, NPO status , Patient's Chart, lab work & pertinent test results  History of Anesthesia Complications Negative for: history of anesthetic complications  Airway Mallampati: II  TM Distance: >3 FB Neck ROM: Full    Dental  (+) Dental Advisory Given,    Pulmonary neg pulmonary ROS,    Pulmonary exam normal breath sounds clear to auscultation       Cardiovascular negative cardio ROS   Rhythm:Regular Rate:Normal     Neuro/Psych  Headaches,    GI/Hepatic negative GI ROS, Neg liver ROS,   Endo/Other  negative endocrine ROS  Renal/GU negative Renal ROS     Musculoskeletal   Abdominal (+) + obese,   Peds  Hematology  (+) 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), anemia ,   Anesthesia Other Findings   Reproductive/Obstetrics                            Anesthesia Physical Anesthesia Plan  ASA: II  Anesthesia Plan: General   Post-op Pain Management:    Induction: Intravenous  Airway Management Planned: LMA  Additional Equipment:   Intra-op Plan:   Post-operative Plan: Extubation in OR  Informed Consent: I have reviewed the patients History and Physical, chart, labs and discussed the procedure including the risks, benefits and alternatives for the proposed anesthesia with the patient or authorized representative who has indicated his/her understanding and acceptance.   Dental advisory given  Plan Discussed with: CRNA  Anesthesia Plan Comments: (Risks of general anesthesia discussed including, but not limited to, sore throat, hoarse voice, chipped/damaged teeth, injury to vocal cords, nausea and vomiting, allergic reactions, lung infection, heart attack, stroke, and death. All questions answered. )       Anesthesia Quick Evaluation

## 2015-11-03 NOTE — Op Note (Addendum)
Operative Note   11/03/2015  PRE-OP DIAGNOSIS: CIN 2 on ECC   POST-OP DIAGNOSIS: Same   SURGEON: Surgeon(s) and Role:    * [facility] Bing[facility]arlie Vann Okerlund, MD - Primary  ASSISTANT: None  PROCEDURE: Cervical conization biopsy with endocervical curettage  ANESTHESIA: General  ESTIMATED BLOOD LOSS: 100mL  DRAINS: per anesthesia notes   TOTAL IV FLUIDS: per anesthesia notes  SPECIMENS: CKC specimen and ECC  VTE PROPHYLAXIS: SCDs to bilateral lower extremities  ANTIBIOTICS: not indicated  COMPLICATIONS: none  DISPOSITION: PACU - hemodynamically stable.  CONDITION: stable  FINDINGS: Exam under anesthesia with acetic acid revealed adequate colposcopy with AWE changes and moscaicism seen circumferentially and more so on the upper half of the cervix.  PROCEDURE IN DETAIL:  After informed consent was obtained, the patient was taken to the operating room where anesthesia was obtained without difficulty. The patient was positioned in the dorsal lithotomy position in [place] stirrups. The bi-valved speculum was placed inside the patient's vagina and acetic acid applied with the above noted findings. The anterior lip of the cervix was then grasped with a tenaculum, and using #1 chromic, two hemostatic sutures were then placed on lateral aspect of the cervix, with one suture on each side.  Next, 10mL of lidocaine with epi was injected into the cervical stroma and two stay sutures were applied to the future specimen and the 12 o'clock stitch of 3-0 silk was tied and the 6 o clock suture wasn't.  Using an 11 blade scalpel, the abnormal colposcopic findings on the cervix was encompassed by starting the incision at 12 o'clock and carried around circumferentially and removed. The ECC was then performed in all four quadrants. The CKC bed was cauterized on 50 coag with the ball and regular bovie tip and surgicel soaked in monsels was applied to the bed for excellent hemostasis.   The patient tolerated the procedure  well. The patient was taken to recovery room in excellent condition.  Cornelia Copaharlie Ailea Rhatigan, Jr MD Attending Center for [facility](Faculty Practice)

## 2015-11-03 NOTE — Transfer of Care (Signed)
Immediate Anesthesia Transfer of Care Note  Patient: Taylor Roman  Procedure(s) Performed: Procedure(s): CONIZATION CERVIX WITH BIOPSY (N/A)  Patient Location: PACU  Anesthesia Type:General  Level of Consciousness: awake, alert  and oriented  Airway & Oxygen Therapy: Patient Spontanous Breathing and Patient connected to nasal cannula oxygen  Post-op Assessment: Report given to RN and Post -op Vital signs reviewed and stable  Post vital signs: Reviewed and stable  Last Vitals:  Vitals:   11/03/15 1204 11/03/15 1420  BP: (!) 140/93 (!) 152/83  Pulse: 60 61  Resp: 20 17  Temp: 36.8 C 36.9 C    Last Pain:  Vitals:   11/03/15 1204  TempSrc: Oral  PainSc: 5       Patients Stated Pain Goal: 6 (11/03/15 1204)  Complications: No apparent anesthesia complications

## 2015-11-04 ENCOUNTER — Encounter (HOSPITAL_COMMUNITY): Payer: Self-pay | Admitting: Obstetrics and Gynecology

## 2015-11-05 ENCOUNTER — Telehealth: Payer: Self-pay | Admitting: Obstetrics and Gynecology

## 2015-11-05 NOTE — Telephone Encounter (Signed)
GYN Telephone note.  11/05/2015 1307  Patient called the office and results d/w her. I told her that hopefully we got everything but recommend repeat pap smear and ECC in the office in six months. Pt amenable to plan and will review with her further at her post op check.  Cornelia Copaharlie Bo Rogue, Jr MD Attending Center for [facility](Faculty Practice)

## 2015-11-05 NOTE — Telephone Encounter (Signed)
GYN Telephone Note 11/05/2015 1258pm  Patient called at [phone] to go over results. Generic VM picked up so patient advised to check mychart for follow up but will recommend 428m cytology only and ECC given negative margins but with +endocervical gland involvement and only blood seen on ECC in the OR  Taylor Roman, Jr MD Attending Center for [facility](Faculty Practice)

## 2015-11-13 ENCOUNTER — Telehealth: Payer: Self-pay

## 2015-11-13 NOTE — Telephone Encounter (Signed)
Called patient and she states that she had surgery on 10/31 and is having bleeding and discharge with a foul odor. I advised patient to go to [facility] for evaluation. Pt is agreeable to this.

## 2015-11-13 NOTE — Telephone Encounter (Signed)
Pt called and wanted to know if it was normal to have bleeding and clots.  Could someone give her a call.

## 2015-12-07 ENCOUNTER — Other Ambulatory Visit (HOSPITAL_COMMUNITY)
Admission: RE | Admit: 2015-12-07 | Discharge: 2015-12-07 | Disposition: A | Discharge status: Home / Self Care | Payer: No Typology Code available for payment source | Source: Ambulatory Visit | Attending: Obstetrics and Gynecology | Admitting: Obstetrics and Gynecology

## 2015-12-07 ENCOUNTER — Ambulatory Visit (INDEPENDENT_AMBULATORY_CARE_PROVIDER_SITE_OTHER): Payer: Self-pay | Admitting: Obstetrics and Gynecology

## 2015-12-07 ENCOUNTER — Encounter: Payer: Self-pay | Admitting: Obstetrics and Gynecology

## 2015-12-07 VITALS — BP 116/60 | HR 63 | Wt 217.0 lb

## 2015-12-07 DIAGNOSIS — N871 Moderate cervical dysplasia: Secondary | ICD-10-CM
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
## 2015-12-14 ENCOUNTER — Telehealth: Payer: Self-pay | Admitting: General Practice

## 2015-12-14 NOTE — Telephone Encounter (Signed)
Per Dr Vergie LivingPickens, call patient and make sure she is aware that we got all the abnormal cells in the OR and you will need a repeat pap smear in one year and each year for the next few years to make sure it doesn't come back. Called patient & asked if she had seen Dr Vergie LivingPickens' mychart message and she states no. Patient states she cannot use mychart because it asks of her social which she doesn't have. Informed patient of results & recommendation. Patient verbalized understanding & had no questions

## 2016-02-05 ENCOUNTER — Ambulatory Visit: Payer: Self-pay | Admitting: Internal Medicine

## 2016-02-08 ENCOUNTER — Telehealth: Payer: Self-pay | Admitting: Licensed Clinical Social Worker

## 2016-02-08 ENCOUNTER — Encounter: Payer: Self-pay | Admitting: Internal Medicine

## 2016-02-08 ENCOUNTER — Ambulatory Visit (INDEPENDENT_AMBULATORY_CARE_PROVIDER_SITE_OTHER): Payer: Self-pay | Admitting: Internal Medicine

## 2016-02-08 VITALS — BP 120/78 | HR 74 | Resp 12 | Ht 67.0 in | Wt 216.0 lb

## 2016-02-08 DIAGNOSIS — Z6833 Body mass index (BMI) 33.0-33.9, adult: Secondary | ICD-10-CM

## 2016-02-08 DIAGNOSIS — R87629 Unspecified abnormal cytological findings in specimens from vagina: Secondary | ICD-10-CM | POA: Insufficient documentation

## 2016-02-08 DIAGNOSIS — R519 Headache, unspecified: Secondary | ICD-10-CM | POA: Insufficient documentation

## 2016-02-08 DIAGNOSIS — Z7689 Persons encountering health services in other specified circumstances: Secondary | ICD-10-CM

## 2016-02-08 DIAGNOSIS — R51 Headache: Secondary | ICD-10-CM

## 2016-02-08 DIAGNOSIS — D649 Anemia, unspecified: Secondary | ICD-10-CM

## 2016-02-08 DIAGNOSIS — E669 Obesity, unspecified: Secondary | ICD-10-CM

## 2016-02-08 NOTE — Telephone Encounter (Signed)
LCSW called pt to introduce social work services at the clinic; left voicemail.

## 2016-02-08 NOTE — Progress Notes (Signed)
Subjective:    Patient ID: Taylor Roman, female    DOB: 1980-01-15, 36 y.o.   MRN: [id number]  HPI   New patient to establish No concerns.  Current Meds  Medication Sig  . ferrous sulfate 325 (65 FE) MG tablet Take 1 tablet (325 mg total) by mouth 2 (two) times daily with a meal.  . folic acid (FOLVITE) 1 MG tablet Take 2 tablets (2 mg total) by mouth daily.  Marland Kitchen ibuprofen (ADVIL,MOTRIN) 600 MG tablet Take 1 tablet (600 mg total) by mouth every 6 (six) hours.  . medroxyPROGESTERone (DEPO-PROVERA) 400 MG/ML SUSP injection Inject into the muscle once.  . Multiple Vitamins-Minerals (MULTIVITAMIN WITH MINERALS) tablet Take 1 tablet by mouth daily.    No Known Allergies   Past Medical History:  Diagnosis Date  . Anemia    States has sickle cell trait, father of child without sickle cell trait  . Headache    otc med prn  . 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 Vaginal Pap smear, abnormal    10/2015 cervical conization with biopsy.  To have repeat pap in JUne 2018 [facility]    Past Surgical History:  Procedure Laterality Date  . CERVICAL CONIZATION W/BX N/A 11/03/2015   Procedure: CONIZATION CERVIX WITH BIOPSY;  Surgeon: [place] Bing, MD;  Location: [facility];  Service: Gynecology;  Laterality: N/A;  . NO PAST SURGERIES      Family History  Problem Relation Age of Onset  . Cancer Mother     Hepatic carcinoma?  No history of viral hepatitis she is aware of  . Diabetes Maternal Grandmother   . Asthma Son   . Anesthesia problems Neg Hx     Social History   Social History  . Marital status: Single    Spouse name: Maliki  . Number of children: 3  . Years of education: 12   Occupational History  . Hair care    Social History Main Topics  . Smoking status: Never Smoker  . Smokeless tobacco: Never Used  . Alcohol use No  . Drug use: No  . Sexual activity: Yes    Birth control/ protection: Injection     Comment: Depo   Other Topics Concern  .  Not on file   Social History Narrative   Originally from [place]   Speaks [place], [place] and [place]   Came to [employer] [place] [employer]. In 2009   Lives with her 3 young sons.   Father of children is in [place].       Depression screen [facility] 2/9 02/08/2016  Decreased Interest 0  Down, Depressed, Hopeless 0  PHQ - 2 Score 0  Altered sleeping 1  Tired, decreased energy 1  Change in appetite 1  Feeling bad or failure about yourself  0  Trouble concentrating 0  Moving slowly or fidgety/restless 0  Suicidal thoughts 0  PHQ-9 Score 3     Review of Systems     Objective:   Physical Exam Obese, NAD HEENT:  PERRL, EOMI, TMs pearly gray, throat without injection Neck:  Supple, No adenopathy, no thyromegaly Chest:  CTA CV:  RRR with normal S1 and S2, No S3, S4 or murmur.  Radial pulses normal and equal. Abd:  S, NT, No HSM or mass, + BS LE:  No edema       Assessment & Plan:  1.  Obesity:  Discussed lifestyle changes with eating and physical activity  2.  Anemia and  family planning:  [facility]  3.  Referral to [employer] for [employer].

## 2016-02-08 NOTE — Patient Instructions (Signed)
Can google "advance directives, [place]"  And bring up form from Secretary of State. Print and fill out Or can go to "5 wishes"  Which is also in Spanish and fill out--this costs $5--perhaps easier to use. Designate a Medical Power of Attorney to speak for you if you are unable to speak for yourself when ill or injured  

## 2016-02-19 ENCOUNTER — Other Ambulatory Visit: Payer: Self-pay

## 2016-02-22 ENCOUNTER — Other Ambulatory Visit (INDEPENDENT_AMBULATORY_CARE_PROVIDER_SITE_OTHER): Payer: Self-pay

## 2016-02-22 DIAGNOSIS — Z13220 Encounter for screening for lipoid disorders: Secondary | ICD-10-CM

## 2016-02-22 DIAGNOSIS — E669 Obesity, unspecified: Secondary | ICD-10-CM

## 2016-02-22 DIAGNOSIS — Z6833 Body mass index (BMI) 33.0-33.9, adult: Principal | ICD-10-CM

## 2016-02-23 ENCOUNTER — Encounter: Payer: Self-pay | Admitting: Internal Medicine

## 2016-02-23 DIAGNOSIS — D5720 Sickle-cell/Hb-C disease without crisis: Secondary | ICD-10-CM | POA: Insufficient documentation

## 2016-02-23 LAB — COMPREHENSIVE METABOLIC PANEL
ALK PHOS: 87 IU/L (ref 39–117)
ALT: 16 IU/L (ref 0–32)
AST: 16 IU/L (ref 0–40)
Albumin/Globulin Ratio: 1.6 (ref 1.2–2.2)
Albumin: 4.5 g/dL (ref 3.5–5.5)
BILIRUBIN TOTAL: 0.7 mg/dL (ref 0.0–1.2)
BUN/Creatinine Ratio: 12 (ref 9–23)
BUN: 8 mg/dL (ref 6–20)
CHLORIDE: 104 mmol/L (ref 96–106)
CO2: 23 mmol/L (ref 18–29)
Calcium: 9.2 mg/dL (ref 8.7–10.2)
Creatinine, Ser: 0.69 mg/dL (ref 0.57–1.00)
GFR calc Af Amer: 129 mL/min/{1.73_m2} (ref >59)
GFR calc non Af Amer: 112 mL/min/{1.73_m2} (ref >59)
GLUCOSE: 87 mg/dL (ref 65–99)
Globulin, Total: 2.9 g/dL (ref 1.5–4.5)
Potassium: 4.6 mmol/L (ref 3.5–5.2)
Sodium: 142 mmol/L (ref 134–144)
Total Protein: 7.4 g/dL (ref 6.0–8.5)

## 2016-02-23 LAB — LIPID PANEL W/O CHOL/HDL RATIO
CHOLESTEROL TOTAL: 141 mg/dL (ref 100–199)
HDL: 48 mg/dL (ref >39)
LDL Calculated: 75 mg/dL (ref 0–99)
TRIGLYCERIDES: 89 mg/dL (ref 0–149)
VLDL CHOLESTEROL CAL: 18 mg/dL (ref 5–40)

## 2016-05-16 ENCOUNTER — Encounter: Payer: Self-pay | Admitting: Internal Medicine

## 2016-05-16 ENCOUNTER — Ambulatory Visit (INDEPENDENT_AMBULATORY_CARE_PROVIDER_SITE_OTHER): Payer: Self-pay | Admitting: Internal Medicine

## 2016-05-16 VITALS — BP 128/70 | HR 76 | Temp 98.5°F | Resp 12 | Ht 67.0 in | Wt 213.0 lb

## 2016-05-16 DIAGNOSIS — J301 Allergic rhinitis due to pollen: Secondary | ICD-10-CM

## 2016-05-16 MED ORDER — CETIRIZINE HCL 10 MG PO TABS: 10.0000 mg | ORAL_TABLET | Freq: Every day | ORAL | 11 refills | Status: DC

## 2016-05-16 NOTE — Progress Notes (Signed)
   Subjective:    Patient ID: Taylor RavelingAmina Roman, female    DOB: September 11, 1980, 36 y.o.   MRN: [id number]  HPI   Fatigued, runny and stuffy nose for 2-3 weeks.  Actually, fatigued for just 3 days.  Eyes do itch, but not watering.  Itchy nose with sneezing.  Throat does itch as well.  Perhaps a bit of dyspnea.   Not clear if worse when outside.  Windows not open in home.  Does have trees surrounding home. Has problems with this in the past in the spring with pollen. No fevers Does have pressure around her eyes.  Mucous is clear, though sometimes with blood from nose.  Zyrtec does help, but only buying the 5 pack.  She did not realize she needed to keep taking the med to control the symptoms Current Meds  Medication Sig  . ferrous sulfate 325 (65 FE) MG tablet Take 1 tablet (325 mg total) by mouth 2 (two) times daily with a meal.  . medroxyPROGESTERone (DEPO-PROVERA) 400 MG/ML SUSP injection Inject into the muscle once.    No Known Allergies  Not coughing.        Review of Systems     Objective:   Physical Exam NAD HEENT:  PERRL, EOMI, conjunctivae without injection, TMs pearly gray.  Nasal mucosa swollen and boggy, mild posterior pharyngeal cobbling. Neck:  Supple, No adenopathy Chest:  CTA CV:  RRR without murmur or rub, radial pulses normal and equal       Assessment & Plan:  Seasonal allergies:  Zyrtec 10 mg daily.  Avoidance measures discussed.  To call if this does not adequately control symptoms and witll add Nasocort through [facility].

## 2016-05-16 NOTE — Patient Instructions (Signed)
Call if the zyrtec does not keep your symptoms under control and we can work on getting you signed up for Flonase. Leave shoes at the door.   Keep windows closed

## 2016-05-18 ENCOUNTER — Encounter (HOSPITAL_COMMUNITY): Payer: Self-pay | Admitting: Emergency Medicine

## 2016-05-18 ENCOUNTER — Emergency Department (HOSPITAL_COMMUNITY)
Admission: EM | Admit: 2016-05-18 | Discharge: 2016-05-18 | Disposition: A | Discharge status: Home / Self Care | Payer: Self-pay | Attending: Emergency Medicine | Admitting: Emergency Medicine

## 2016-05-18 ENCOUNTER — Other Ambulatory Visit: Payer: Self-pay

## 2016-05-18 ENCOUNTER — Emergency Department (HOSPITAL_COMMUNITY): Payer: Self-pay

## 2016-05-18 DIAGNOSIS — Z79899 Other long term (current) drug therapy: Secondary | ICD-10-CM | POA: Insufficient documentation

## 2016-05-18 DIAGNOSIS — R7989 Other specified abnormal findings of blood chemistry: Secondary | ICD-10-CM

## 2016-05-18 DIAGNOSIS — R42 Dizziness and giddiness: Secondary | ICD-10-CM | POA: Insufficient documentation

## 2016-05-18 DIAGNOSIS — R791 Abnormal coagulation profile: Secondary | ICD-10-CM | POA: Insufficient documentation

## 2016-05-18 DIAGNOSIS — R002 Palpitations: Secondary | ICD-10-CM | POA: Insufficient documentation

## 2016-05-18 LAB — BASIC METABOLIC PANEL
ANION GAP: 8 (ref 5–15)
BUN: 6 mg/dL (ref 6–20)
CHLORIDE: 106 mmol/L (ref 101–111)
CO2: 23 mmol/L (ref 22–32)
Calcium: 8.6 mg/dL — ABNORMAL LOW (ref 8.9–10.3)
Creatinine, Ser: 0.64 mg/dL (ref 0.44–1.00)
GFR calc Af Amer: >60 mL/min (ref >60)
Glucose, Bld: 110 mg/dL — ABNORMAL HIGH (ref 65–99)
POTASSIUM: 3.5 mmol/L (ref 3.5–5.1)
SODIUM: 137 mmol/L (ref 135–145)

## 2016-05-18 LAB — CBC
HEMATOCRIT: 26.7 % — AB (ref 36.0–46.0)
Hemoglobin: 9.1 g/dL — ABNORMAL LOW (ref 12.0–15.0)
MCH: 25.6 pg — ABNORMAL LOW (ref 26.0–34.0)
MCHC: 34.1 g/dL (ref 30.0–36.0)
MCV: 75.2 fL — AB (ref 78.0–100.0)
Platelets: 202 10*3/uL (ref 150–400)
RBC: 3.55 MIL/uL — AB (ref 3.87–5.11)
RDW: 18.5 % — AB (ref 11.5–15.5)
WBC: 6.7 10*3/uL (ref 4.0–10.5)

## 2016-05-18 LAB — I-STAT TROPONIN, ED: Troponin i, poc: 0 ng/mL (ref 0.00–0.08)

## 2016-05-18 LAB — D-DIMER, QUANTITATIVE (NOT AT ARMC): D DIMER QUANT: 0.81 ug{FEU}/mL — AB (ref 0.00–0.50)

## 2016-05-18 MED ORDER — IOPAMIDOL (ISOVUE-370) INJECTION 76%
INTRAVENOUS | Status: AC
  Administered 2016-05-18: 100 mL
  Filled 2016-05-18: qty 100

## 2016-05-18 NOTE — ED Provider Notes (Signed)
[facility]-EMERGENCY DEPT Provider Note   CSN: [id number] Arrival date & time: 05/18/16  0054  By signing my name below, I, Phillips Climes, attest that this documentation has been prepared under the direction and in the presence of Oseas Detty, Mayer Masker, MD . Electronically Signed: Phillips Climes, Scribe. 05/18/2016. 4:58 AM.   History   Chief Complaint Chief Complaint  Patient presents with  . Palpitations  . Dizziness   Taylor Roman is a 36 y.o. female with a PMHx of sickle cell disease, who presents to the Emergency Department with complaints of intermittent palpitations and dizziness, which have been worsening over the last x4 days. Her palpitations are currently resolved without any symptomatic treatment. Dizziness is still present and worse with standing.She will describes it as both lightheadedness and room spinning.   Pt with no PMHx of palpitations. No recent increase in caffeine. No use of any supplements or weight loss aides. Reports normal PO intake.   She denies experiencing any other acute sx, including chest pain, dyspnea, abdominal pain, nausea, vomiting or diarrhea.   The history is provided by the patient and medical records. No language interpreter was used.    Past Medical History:  Diagnosis Date  . Anemia    States has sickle cell trait, father of child without sickle cell trait  . Headache    otc med prn  . 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 Vaginal Pap smear, abnormal    10/2015 cervical conization with biopsy.  To have repeat pap in JUne 2018 at [facility]    Patient Active Problem List   Diagnosis Date Noted  . Hemoglobin S-C disease (HCC)   . Anemia   . Vaginal Pap smear, abnormal   . Headache   . 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 CERVICAL CONIZATION W/BX  N/A 11/03/2015   Procedure: CONIZATION CERVIX WITH BIOPSY;  Surgeon: [place] Bing, MD;  Location: [facility];  Service: Gynecology;  Laterality: N/A;  . NO PAST SURGERIES      OB History    Gravida Para Term Preterm AB Living   4 4 4    0 3   SAB TAB Ectopic Multiple Live Births   0     0 4       Home Medications    Prior to Admission medications   Medication Sig Start Date End Date Taking? Authorizing Provider  cetirizine (ZYRTEC) 10 MG tablet Take 1 tablet (10 mg total) by mouth daily. 05/16/16   Julieanne Manson, MD  ferrous sulfate 325 (65 FE) MG tablet Take 1 tablet (325 mg total) by mouth 2 (two) times daily with a meal. 04/20/15   Adam Phenix, MD  folic acid (FOLVITE) 1 MG tablet Take 2 tablets (2 mg total) by mouth daily. Patient not taking: Reported on 05/16/2016 02/19/15   Tereso Newcomer, MD  ibuprofen (ADVIL,MOTRIN) 600 MG tablet Take 1 tablet (600 mg total) by mouth every 6 (six) hours. Patient not taking: Reported on 05/16/2016 07/27/15   Katrinka Blazing [place], CNM  medroxyPROGESTERone (DEPO-PROVERA) 400 MG/ML SUSP injection Inject into the muscle once.    [provider]  Multiple Vitamins-Minerals (MULTIVITAMIN WITH MINERALS) tablet Take 1 tablet by mouth daily.    [provider]    Family History Family History  Problem Relation  Age of Onset  . Cancer Mother        Hepatic carcinoma?  No history of viral hepatitis she is aware of  . Diabetes Maternal Grandmother   . Asthma Son   . Anesthesia problems Neg Hx     Social History Social History  Substance Use Topics  . Smoking status: Never Smoker  . Smokeless tobacco: Never Used  . Alcohol use No     Allergies   Patient has no known allergies.   Review of Systems Review of Systems  Constitutional: Negative for fever.  Respiratory: Negative for shortness of breath.   Cardiovascular: Positive for palpitations (Resolved). Negative for chest pain.  Gastrointestinal: Negative for  abdominal pain, diarrhea, nausea and vomiting.  Neurological: Positive for dizziness and light-headedness. Negative for weakness and numbness.  All other systems reviewed and are negative.  Physical Exam Updated Vital Signs BP 120/67   Pulse 64   Temp 98.3 F (36.8 C) (Oral)   Resp 17   Ht 5\' 8"  (1.727 m)   Wt 213 lb (96.6 kg)   LMP  (LMP Unknown)   SpO2 100%   BMI 32.39 kg/m   Physical Exam  Constitutional: She is oriented to person, place, and time. She appears well-developed and well-nourished. No distress.  HENT:  Head: Normocephalic and atraumatic.  Cardiovascular: Normal rate, regular rhythm and normal heart sounds.   Pulmonary/Chest: Effort normal and breath sounds normal. No respiratory distress. She has no wheezes.  Abdominal: Soft. Bowel sounds are normal. There is no tenderness. There is no guarding.  Musculoskeletal: She exhibits no edema.  Neurological: She is alert and oriented to person, place, and time.  Cranial nerves II through XII intact, 5 out of 5 strength in all 4 extremities, no dysmetria to finger-nose-finger  Skin: Skin is warm and dry.  Psychiatric: She has a normal mood and affect.  Nursing note and vitals reviewed.    ED Treatments / Results  DIAGNOSTIC STUDIES: Oxygen Saturation is 100% on RA, nl by my interpretation.    COORDINATION OF CARE: 2:46 AM Discussed treatment plan with pt at bedside and pt agreed to plan.  Labs (all labs ordered are listed, but only abnormal results are displayed) Labs Reviewed  BASIC METABOLIC PANEL - Abnormal; Notable for the following:       Result Value   Glucose, Bld 110 (*)    Calcium 8.6 (*)    All other components within normal limits  CBC - Abnormal; Notable for the following:    RBC 3.55 (*)    Hemoglobin 9.1 (*)    HCT 26.7 (*)    MCV 75.2 (*)    MCH 25.6 (*)    RDW 18.5 (*)    All other components within normal limits  D-DIMER, QUANTITATIVE (NOT AT [facility]) - Abnormal; Notable for the following:     D-Dimer, Quant 0.81 (*)    All other components within normal limits  I-STAT TROPOININ, ED    EKG  EKG Interpretation None      ED ECG REPORT   Date: 05/18/2016  Rate: 60  Rhythm: normal sinus rhythm  QRS Axis: normal  Intervals: normal  ST/T Wave abnormalities: normal  Conduction Disutrbances:none  Narrative Interpretation:   Old EKG Reviewed: none available  I have personally reviewed the EKG tracing and agree with the computerized printout as noted.   Radiology Dg Chest 2 View  Result Date: 05/18/2016 CLINICAL DATA:  Intermittent chest palpitations, dizziness and weakness starting 4 days  ago. EXAM: CHEST  2 VIEW COMPARISON:  03/09/2010 CXR FINDINGS: The heart size and mediastinal contours are within normal limits. Both lungs are clear. The visualized skeletal structures are unremarkable. IMPRESSION: No active cardiopulmonary disease. Electronically Signed   By: Tollie Eth M.D.   On: 05/18/2016 01:45   Ct Angio Chest Pe W Or Wo Contrast  Result Date: 05/18/2016 CLINICAL DATA:  Palpitations and dizziness.  Elevated D-dimer. EXAM: CT ANGIOGRAPHY CHEST WITH CONTRAST TECHNIQUE: Multidetector CT imaging of the chest was performed using the standard protocol during bolus administration of intravenous contrast. Multiplanar CT image reconstructions and MIPs were obtained to evaluate the vascular anatomy. CONTRAST:  100 mL Isovue 370 COMPARISON:  Chest radiograph 05/18/2016 FINDINGS: Cardiovascular: Contrast injection is sufficient to demonstrate satisfactory opacification of the pulmonary arteries to the segmental level. There is no pulmonary embolus. The main pulmonary artery is within normal limits for size. There is no CT evidence of acute right heart strain. The visualized aorta is normal. There is a normal 3-vessel arch branching pattern. Heart size is normal, without pericardial effusion. Mediastinum/Nodes: No mediastinal, hilar or axillary lymphadenopathy. The visualized  thyroid and thoracic esophageal course are unremarkable. Lungs/Pleura: No pulmonary nodules or masses. No pleural effusion or pneumothorax. No focal airspace consolidation. No focal pleural abnormality. Upper Abdomen: Contrast bolus timing is not optimized for evaluation of the abdominal organs. Within this limitation, the visualized organs of the upper abdomen are normal. Musculoskeletal: No chest wall abnormality. No acute or significant osseous findings. Review of the MIP images confirms the above findings. IMPRESSION: 1. No pulmonary embolus. 2. No other acute thoracic abnormality. Electronically Signed   By: Deatra Robinson M.D.   On: 05/18/2016 04:30    Procedures Procedures (including critical care time)  Medications Ordered in ED Medications  iopamidol (ISOVUE-370) 76 % injection (100 mLs  Contrast Given 05/18/16 0355)     Initial Impression / Assessment and Plan / ED Course  I have reviewed the triage vital signs and the nursing notes.  Pertinent labs & imaging results that were available during my care of the patient were reviewed by me and considered in my medical decision making (see chart for details).     Patient presents with palpitations and dizziness. Nontoxic appearing on exam. Currently mostly asymptomatic. Not experiencing palpitations. Mild dizziness. Orthostatics are negative. EKG is without evidence of arrhythmia. Troponin negative. Other lab work including metabolites are reassuring. D-dimer was sent given she is on birth control and experiencing palpitations and dizziness. This was mildly elevated. CT scan was obtained and negative. She has remained asymptomatic in the ED. She is ambulatory independently. Discussed with patient that she may need Holter monitoring if she has recurrence of her symptoms. She will be given cardiology follow-up. Patient stated understanding.  After history, exam, and medical workup I feel the patient has been appropriately medically screened and  is safe for discharge home. Pertinent diagnoses were discussed with the patient. Patient was given return precautions.   Final Clinical Impressions(s) / ED Diagnoses   Final diagnoses:  Positive D dimer  Palpitations  Dizziness    New Prescriptions New Prescriptions   No medications on file   I personally performed the services described in this documentation, which was scribed in my presence. The recorded information has been reviewed and is accurate.    Shon Baton, MD 05/18/16 [phone]

## 2016-05-18 NOTE — ED Notes (Signed)
Pt c/o dizziness at this time. No current c/o feeling like heart is racing. Pulse currently 65.

## 2016-05-18 NOTE — ED Notes (Signed)
Horton, MD at bedside.  

## 2016-05-18 NOTE — ED Triage Notes (Signed)
Pt presents with intermittent palpitations, dizziness, and weakness; pt states they began 4 days ago; not currently happening in triage area at this time; pt denies CP, sob, diaphoresis; pt denies hx of same

## 2016-05-18 NOTE — Discharge Instructions (Signed)
You were seen today for palpitations. Your workup is reassuring. You may need follow-up with cardiology for possible Holter monitoring if symptoms persist.  If you have any new or worsening symptoms she needs to be reevaluated.

## 2016-09-29 ENCOUNTER — Ambulatory Visit (HOSPITAL_COMMUNITY)
Admission: RE | Admit: 2016-09-29 | Discharge: 2016-09-29 | Disposition: A | Discharge status: Home / Self Care | Payer: No Typology Code available for payment source | Source: Ambulatory Visit | Attending: Obstetrics and Gynecology | Admitting: Obstetrics and Gynecology

## 2016-09-29 NOTE — Progress Notes (Signed)
Pap smear due after December 09, 2016. Pap smear not indicated today per BCCCP guidelines. Will reschedule patient to come to [facility] in December 2018.

## 2016-10-06 ENCOUNTER — Encounter (HOSPITAL_COMMUNITY): Payer: Self-pay | Admitting: Emergency Medicine

## 2016-10-06 ENCOUNTER — Emergency Department (HOSPITAL_COMMUNITY): Payer: No Typology Code available for payment source

## 2016-10-06 DIAGNOSIS — R079 Chest pain, unspecified: Secondary | ICD-10-CM | POA: Insufficient documentation

## 2016-10-06 DIAGNOSIS — Z79899 Other long term (current) drug therapy: Secondary | ICD-10-CM | POA: Insufficient documentation

## 2016-10-06 LAB — CBC
HCT: 26.6 % — ABNORMAL LOW (ref 36.0–46.0)
HEMOGLOBIN: 9.2 g/dL — AB (ref 12.0–15.0)
MCH: 26.1 pg (ref 26.0–34.0)
MCHC: 34.6 g/dL (ref 30.0–36.0)
MCV: 75.4 fL — ABNORMAL LOW (ref 78.0–100.0)
Platelets: 201 10*3/uL (ref 150–400)
RBC: 3.53 MIL/uL — ABNORMAL LOW (ref 3.87–5.11)
RDW: 18.3 % — AB (ref 11.5–15.5)
WBC: 7.5 10*3/uL (ref 4.0–10.5)

## 2016-10-06 LAB — BASIC METABOLIC PANEL
Anion gap: 6 (ref 5–15)
BUN: 9 mg/dL (ref 6–20)
CALCIUM: 9 mg/dL (ref 8.9–10.3)
CO2: 24 mmol/L (ref 22–32)
Chloride: 108 mmol/L (ref 101–111)
Creatinine, Ser: 0.77 mg/dL (ref 0.44–1.00)
GFR calc Af Amer: >60 mL/min (ref >60)
GLUCOSE: 112 mg/dL — AB (ref 65–99)
Potassium: 3.7 mmol/L (ref 3.5–5.1)
SODIUM: 138 mmol/L (ref 135–145)

## 2016-10-06 NOTE — ED Triage Notes (Signed)
Patient reports chronic/intermittent left chest pain radiating to upper back onset >1 year , pt. added left shoulder pain for 3 months , denies injury , no SOB or cough .

## 2016-10-07 ENCOUNTER — Emergency Department (HOSPITAL_COMMUNITY)
Admission: EM | Admit: 2016-10-07 | Discharge: 2016-10-07 | Disposition: A | Discharge status: Home / Self Care | Payer: No Typology Code available for payment source | Attending: Emergency Medicine | Admitting: Emergency Medicine

## 2016-10-07 DIAGNOSIS — R079 Chest pain, unspecified: Secondary | ICD-10-CM

## 2016-10-07 LAB — I-STAT TROPONIN, ED: TROPONIN I, POC: 0 ng/mL (ref 0.00–0.08)

## 2016-10-07 NOTE — Discharge Instructions (Signed)
All your tests today looked good.  No signs of heart attack.  We are not sure why you keep having chest pain. Recommend to follow-up with your primary care doctor. You can return here for any new/acute changes.

## 2016-10-07 NOTE — ED Provider Notes (Signed)
[facility]-EMERGENCY DEPT Provider Note   CSN: [id number] Arrival date & time: 10/06/16  2200     History   Chief Complaint Chief Complaint  Patient presents with  . Chest Pain    HPI Taylor Roman is a 36 y.o. female.  The history is provided by the patient and medical records.  Chest Pain       36 year old female with history of anemia, headaches, sickle cell trait, presenting to the ED with chest pain.States this is been an ongoing issue for the past year. States she has had periods of intermittent palpitations, most recent was last week. She denies any shortness of breath during these episodes. States pain is generalized to her chest and back, dull and aching in nature.  She denies any dizziness or feelings of syncope. She has no known cardiac history.  She is not a smoker.  Was previously on birth control, none currently.  No hx of DVT or PE.  Patient has been evaluated for similar symptoms multiple times in the past without known cause.  Past Medical History:  Diagnosis Date  . Anemia    States has sickle cell trait, father of child without sickle cell trait  . Headache    otc med prn  . 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 Vaginal Pap smear, abnormal    10/2015 cervical conization with biopsy.  To have repeat pap in JUne 2018 at [facility]    Patient Active Problem List   Diagnosis Date Noted  . Hemoglobin S-C disease (HCC)   . Anemia   . Vaginal Pap smear, abnormal   . Headache   . 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 CERVICAL CONIZATION W/BX N/A 11/03/2015   Procedure: CONIZATION CERVIX WITH BIOPSY;  Surgeon: Jim Hogg Bing, MD;  Location: [facility];  Service: Gynecology;  Laterality: N/A;  . NO PAST SURGERIES      OB History    Gravida Para Term Preterm AB Living   0 3   SAB TAB Ectopic Multiple Live Births   0     0 4       Home Medications    Prior to Admission medications   Medication Sig Start Date End Date Taking? Authorizing Provider  ferrous sulfate 325 (65 FE) MG tablet Take 1 tablet (325 mg total) by mouth 2 (two) times daily with a meal. 04/20/15  Yes Adam Phenix, MD  ibuprofen (ADVIL,MOTRIN) 200 MG tablet Take 200 mg by mouth every 6 (six) hours as needed for mild pain.   Yes [provider]  cetirizine (ZYRTEC) 10 MG tablet Take 1 tablet (10 mg total) by mouth daily. Patient not taking: Reported on 10/07/2016 05/16/16   Julieanne Manson, MD  folic acid (FOLVITE) 1 MG tablet Take 2 tablets (2 mg total) by mouth daily. Patient not taking: Reported on 05/16/2016 02/19/15   Tereso Newcomer, MD  ibuprofen (ADVIL,MOTRIN) 600 MG tablet Take 1 tablet (600 mg total) by mouth every 6 (six) hours. Patient not taking: Reported on 05/16/2016 07/27/15   Dorathy Kinsman, CNM    Family History Family History  Problem Relation Age of Onset  . Cancer Mother        Hepatic carcinoma?  No history of viral hepatitis she is aware of  .  Diabetes Maternal Grandmother   . Asthma Son   . Anesthesia problems Neg Hx     Social History Social History  Substance Use Topics  . Smoking status: Never Smoker  . Smokeless tobacco: Never Used  . Alcohol use No     Allergies   Patient has no known allergies.   Review of Systems Review of Systems  Cardiovascular: Positive for chest pain.  All other systems reviewed and are negative.    Physical Exam Updated Vital Signs BP 125/73 (BP Location: Right Arm)   Pulse (!) 59   Temp 98.1 F (36.7 C) (Oral)   Resp 16   Ht  (1.676 m)   Wt 95.3 kg (210 lb)   SpO2 100%   BMI 33.89 kg/m   Physical Exam  Constitutional: She is oriented to person, place, and time. She appears well-developed and well-nourished.  HENT:  Head: Normocephalic and atraumatic.  Mouth/Throat: Oropharynx is  clear and moist.  Eyes: Pupils are equal, round, and reactive to light. Conjunctivae and EOM are normal.  Neck: Normal range of motion.  Cardiovascular: Normal rate, regular rhythm and normal heart sounds.   Pulmonary/Chest: Effort normal and breath sounds normal. No respiratory distress. She has no wheezes.  Abdominal: Soft. Bowel sounds are normal.  Musculoskeletal: Normal range of motion.  Neurological: She is alert and oriented to person, place, and time.  Skin: Skin is warm and dry.  Psychiatric: She has a normal mood and affect.  Nursing note and vitals reviewed.    ED Treatments / Results  Labs (all labs ordered are listed, but only abnormal results are displayed) Labs Reviewed  BASIC METABOLIC PANEL - Abnormal; Notable for the following:       Result Value   Glucose, Bld 112 (*)    All other components within normal limits  CBC - Abnormal; Notable for the following:    RBC 3.53 (*)    Hemoglobin 9.2 (*)    HCT 26.6 (*)    MCV 75.4 (*)    RDW 18.3 (*)    All other components within normal limits  I-STAT TROPONIN, ED  I-STAT TROPONIN, ED    EKG  EKG Interpretation None       Radiology Dg Chest 2 View  Result Date: 10/06/2016 CLINICAL DATA:  Chest pain EXAM: CHEST  2 VIEW COMPARISON:  05/18/2016 FINDINGS: The heart size and mediastinal contours are within normal limits. Both lungs are clear. The visualized skeletal structures are unremarkable. IMPRESSION: No active cardiopulmonary disease. Electronically Signed   By: Jasmine Pang M.D.   On: 10/06/2016 23:17    Procedures Procedures (including critical care time)  Medications Ordered in ED Medications - No data to display   Initial Impression / Assessment and Plan / ED Course  I have reviewed the triage vital signs and the nursing notes.  Pertinent labs & imaging results that were available during my care of the patient were reviewed by me and considered in my medical decision making (see chart for  details).  36 year old female here with chest pain. This is been ongoing for about a year now. Reports intermittent palpitations, none currently. She is afebrile and nontoxic. EKG is nonischemic. Labs reassuring, trop negative. At this time given her ongoing symptoms for over a year and negative workup today, have low suspicion for ACS, PE, dissection, acute cardiac event. She is no longer on birth control and does not have any significant risk factors for PE. No tachycardia or hypoxia to  suggest such. I recommended that she follow-up closely with her primary care doctor.  Discussed plan with patient, she acknowledged understanding and agreed with plan of care.  Return precautions given for new or worsening symptoms.  Final Clinical Impressions(s) / ED Diagnoses   Final diagnoses:  Chest pain, unspecified type    New Prescriptions Discharge Medication List as of 10/07/2016  3:12 AM       Garlon Hatchet, PA-C 10/07/16 1610    Azalia Bilis, MD 10/07/16 [phone]

## 2016-12-20 ENCOUNTER — Encounter (HOSPITAL_COMMUNITY): Payer: Self-pay | Admitting: *Deleted

## 2016-12-20 ENCOUNTER — Ambulatory Visit (HOSPITAL_COMMUNITY)
Admission: RE | Admit: 2016-12-20 | Discharge: 2016-12-20 | Disposition: A | Discharge status: Home / Self Care | Payer: Self-pay | Source: Ambulatory Visit | Attending: Obstetrics and Gynecology | Admitting: Obstetrics and Gynecology

## 2016-12-20 VITALS — BP 118/64 | Ht 67.0 in | Wt 218.0 lb

## 2016-12-20 DIAGNOSIS — Z01419 Encounter for gynecological examination (general) (routine) without abnormal findings: Secondary | ICD-10-CM

## 2016-12-20 NOTE — Addendum Note (Signed)
Encounter addended by: Lynnell DikeHolland, Micki Cassel H, LPN on: 16/10/960412/18/2018 3:40 PM  Actions taken: Order list changed

## 2016-12-20 NOTE — Progress Notes (Signed)
No complaints today.   Pap Smear: Pap smear completed today. [facility] and HGS[facility] with positive HPV. 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 Per patient her most recent Pap smear is the only abnormal Pap smear she has had. Last Pap smear, colposcopy, and CKC reports  Physical exam: Breasts Breasts symmetrical. No skin abnormalities bilateral breasts. No nipple retraction bilateral breasts. No nipple discharge bilateral breasts. No lymphadenopathy. No lumps palpated bilateral breasts. No complaints of pain or tenderness on exam. Screening mammogram recommended at age 36 unless clinically indicated prior.  Pelvic/Bimanual   Ext Genitalia No lesions, no swelling and no discharge observed on external genitalia.         Vagina Vagina pink and normal texture. No lesions or discharge observed in vagina.          Cervix Cervix is present. Cervix pink and of normal texture. No discharge observed.     Uterus Uterus is present and palpable. Uterus in normal position and normal size.        Adnexae Bilateral ovaries present and palpable. No tenderness on palpation.          Rectovaginal No rectal exam completed today since patient had no rectal complaints. No skin abnormalities observed on exam.    Smoking History: Patient has never smoked.  Patient Navigation: Patient education provided. Access to services provided [facility].

## 2016-12-20 NOTE — Patient Instructions (Signed)
Explained breast self awareness with Taylor Roman. Informed patient that if today's Pap smear is normal that her next Pap smear will be due in one year due to her recent history of an abnormal Pap smear. Let patient know will follow up with her within the next couple weeks with results of Pap smear by phone. Keller Amedee verbalized understanding.  Brannock, Kathaleen Maserhristine Poll, RN 2:58 PM

## 2016-12-21 ENCOUNTER — Encounter (HOSPITAL_COMMUNITY): Payer: Self-pay | Admitting: *Deleted

## 2016-12-22 LAB — CYTOLOGY - PAP
DIAGNOSIS: NEGATIVE
HPV (WINDOPATH): NOT DETECTED

## 2016-12-31 ENCOUNTER — Other Ambulatory Visit (HOSPITAL_COMMUNITY): Payer: Self-pay | Admitting: *Deleted

## 2016-12-31 ENCOUNTER — Telehealth (HOSPITAL_COMMUNITY): Payer: Self-pay | Admitting: *Deleted

## 2016-12-31 DIAGNOSIS — B379 Candidiasis, unspecified: Secondary | ICD-10-CM

## 2016-12-31 MED ORDER — FLUCONAZOLE 150 MG PO TABS: 150.0000 mg | ORAL_TABLET | Freq: Once | ORAL | 0 refills | Status: AC

## 2016-12-31 NOTE — Telephone Encounter (Signed)
Telephoned patient at home number and left message to [facility].

## 2016-12-31 NOTE — Telephone Encounter (Signed)
Telephoned patient at home number and advised patient of negative pap smear results. HPV was negative. Next pap smear due in one year due to history of abnormal pap smear. Advised patient pap smear did show yeast. Medication was called into [facility]. Patient voiced understanding.

## 2017-04-30 ENCOUNTER — Other Ambulatory Visit: Payer: Self-pay

## 2017-04-30 ENCOUNTER — Emergency Department (HOSPITAL_COMMUNITY): Payer: Self-pay

## 2017-04-30 ENCOUNTER — Encounter (HOSPITAL_COMMUNITY): Payer: Self-pay | Admitting: Emergency Medicine

## 2017-04-30 ENCOUNTER — Emergency Department (HOSPITAL_COMMUNITY)
Admission: EM | Admit: 2017-04-30 | Discharge: 2017-04-30 | Disposition: A | Discharge status: Home / Self Care | Payer: Self-pay | Attending: Emergency Medicine | Admitting: Emergency Medicine

## 2017-04-30 DIAGNOSIS — N760 Acute vaginitis: Secondary | ICD-10-CM | POA: Insufficient documentation

## 2017-04-30 DIAGNOSIS — B9689 Other specified bacterial agents as the cause of diseases classified elsewhere: Secondary | ICD-10-CM

## 2017-04-30 DIAGNOSIS — B9789 Other viral agents as the cause of diseases classified elsewhere: Secondary | ICD-10-CM | POA: Insufficient documentation

## 2017-04-30 DIAGNOSIS — N857 Hematometra: Secondary | ICD-10-CM | POA: Insufficient documentation

## 2017-04-30 LAB — COMPREHENSIVE METABOLIC PANEL
ALBUMIN: 3.8 g/dL (ref 3.5–5.0)
ALK PHOS: 54 U/L (ref 38–126)
ALT: 21 U/L (ref 14–54)
AST: 25 U/L (ref 15–41)
Anion gap: 9 (ref 5–15)
BILIRUBIN TOTAL: 1.2 mg/dL (ref 0.3–1.2)
BUN: 9 mg/dL (ref 6–20)
CALCIUM: 8.7 mg/dL — AB (ref 8.9–10.3)
CO2: 25 mmol/L (ref 22–32)
Chloride: 104 mmol/L (ref 101–111)
Creatinine, Ser: 0.65 mg/dL (ref 0.44–1.00)
GFR calc Af Amer: >60 mL/min (ref >60)
GFR calc non Af Amer: >60 mL/min (ref >60)
GLUCOSE: 106 mg/dL — AB (ref 65–99)
Potassium: 3.7 mmol/L (ref 3.5–5.1)
Sodium: 138 mmol/L (ref 135–145)
Total Protein: 7.3 g/dL (ref 6.5–8.1)

## 2017-04-30 LAB — URINALYSIS, ROUTINE W REFLEX MICROSCOPIC
BILIRUBIN URINE: NEGATIVE
Glucose, UA: NEGATIVE mg/dL
Ketones, ur: NEGATIVE mg/dL
NITRITE: NEGATIVE
PH: 7 (ref 5.0–8.0)
Protein, ur: NEGATIVE mg/dL
SPECIFIC GRAVITY, URINE: 1.011 (ref 1.005–1.030)

## 2017-04-30 LAB — I-STAT BETA HCG BLOOD, ED (MC, WL, AP ONLY): I-stat hCG, quantitative: <5 m[IU]/mL (ref <5)

## 2017-04-30 LAB — WET PREP, GENITAL
Sperm: NONE SEEN
TRICH WET PREP: NONE SEEN
Yeast Wet Prep HPF POC: NONE SEEN

## 2017-04-30 LAB — CBC
HEMATOCRIT: 27.2 % — AB (ref 36.0–46.0)
Hemoglobin: 9.4 g/dL — ABNORMAL LOW (ref 12.0–15.0)
MCH: 26.1 pg (ref 26.0–34.0)
MCHC: 34.6 g/dL (ref 30.0–36.0)
MCV: 75.6 fL — ABNORMAL LOW (ref 78.0–100.0)
Platelets: 202 10*3/uL (ref 150–400)
RBC: 3.6 MIL/uL — ABNORMAL LOW (ref 3.87–5.11)
RDW: 18.4 % — AB (ref 11.5–15.5)
WBC: 6.4 10*3/uL (ref 4.0–10.5)

## 2017-04-30 LAB — LIPASE, BLOOD: Lipase: 25 U/L (ref 11–51)

## 2017-04-30 MED ORDER — IOPAMIDOL (ISOVUE-300) INJECTION 61%
INTRAVENOUS | Status: AC
  Administered 2017-04-30: 100 mL
  Filled 2017-04-30: qty 100

## 2017-04-30 MED ORDER — ONDANSETRON HCL 4 MG/2ML IJ SOLN
4.0000 mg | Freq: Once | INTRAMUSCULAR | Status: AC
  Administered 2017-04-30: 4 mg via INTRAVENOUS
  Filled 2017-04-30: qty 2

## 2017-04-30 MED ORDER — SODIUM CHLORIDE 0.9 % IV BOLUS
1000.0000 mL | Freq: Once | INTRAVENOUS | Status: AC
  Administered 2017-04-30: 1000 mL via INTRAVENOUS

## 2017-04-30 MED ORDER — OXYCODONE-ACETAMINOPHEN 5-325 MG PO TABS: 1.0000 | ORAL_TABLET | ORAL | 0 refills | Status: DC | PRN

## 2017-04-30 MED ORDER — METRONIDAZOLE 500 MG PO TABS: 500.0000 mg | ORAL_TABLET | Freq: Two times a day (BID) | ORAL | 0 refills | Status: DC

## 2017-04-30 MED ORDER — MORPHINE SULFATE (PF) 4 MG/ML IV SOLN
4.0000 mg | Freq: Once | INTRAVENOUS | Status: AC
  Administered 2017-04-30: 4 mg via INTRAVENOUS
  Filled 2017-04-30: qty 1

## 2017-04-30 NOTE — ED Provider Notes (Signed)
Patient signed out to me by Janace Aris, PA-C.  Please see previous notes for further history.  In brief, patient with lower abdominal cramping.  CT showed possible bleeding into the uterus.  Discussed with gynecologist on-call, who recommends pelvic ultrasound and follow-up outpatient.  Pelvic ultrasound pending.  Ultrasound consistent with CT.  Will discharge patient with medication for pain control and follow-up with GYN.  At this time, patient appears to be discharged.  Return precautions given.  Patient states she understands and agrees plan.   Alveria Apley, PA-C 04/30/17 1830    Gerhard Munch, MD 04/30/17 2040

## 2017-04-30 NOTE — ED Triage Notes (Signed)
Patient with abdominal pain that started yesterday.  She states that it feels crampy in nature.  She denies any nausea or vomiting.  She states that the pain goes down into her vaginal area.  She denies being pregnant.  Patient is on Depo shot.

## 2017-04-30 NOTE — ED Provider Notes (Signed)
[facility] EMERGENCY DEPARTMENT Provider Note   CSN: [id number] Arrival date & time: 04/30/17  0548     History   Chief Complaint Chief Complaint  Patient presents with  . Abdominal Pain    HPI Taylor Roman is a 37 y.o. female.  HPI   Taylor Roman is a 37 year old female with a history of sickle cell trait, headaches who presents to the emergency department for evaluation of lower abdominal pain.  Patient states that her pain began yesterday.  States that pain is located in bilateral lower abdomen and radiates to her lower back bilaterally.  States that pain is intermittent and feels like a severe cramp, as if she is having labor contractions.  Denies known trigger to pain.  Reports that she tried taking ibuprofen at home without significant relief.  Denies fevers, chills, nausea/vomiting, diarrhea, melena, hematochezia, dysuria, urinary frequency, hematuria, flank pain, vaginal discharge, chest pain, shortness of breath, numbness, weakness, lightheadedness, syncope.  She does report that she has a lower appetite.  She denies previous abdominal surgeries.  States that she does not have regular periods since taking Depakote shot.  Reports that she got her last Depakote injection 10/2016.  Reports that she is sexually active with one female partner and does not not use condoms regularly.  She has been seen by gynecology in the past where she had a cervical conization procedure for her abnormal Pap smears.  Past Medical History:  Diagnosis Date  . Anemia    States has sickle cell trait, father of child without sickle cell trait  . Headache    otc med prn  . 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 Vaginal Pap smear, abnormal    10/2015 cervical conization with biopsy.  To have repeat pap in JUne 2018 at [facility]    Patient Active Problem List   Diagnosis Date Noted  . Hemoglobin S-C disease (HCC)   . Anemia   . Vaginal Pap smear, abnormal   . Headache    . 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 CERVICAL CONIZATION W/BX N/A 11/03/2015   Procedure: CONIZATION CERVIX WITH BIOPSY;  Surgeon: Addyston Bing, MD;  Location: [facility] OR[facility];  Service: Gynecology;  Laterality: N/A;  . NO PAST SURGERIES       OB History    Gravida  4   Para  4   Term  4   Preterm      AB  0   Living  3     SAB  0   TAB      Ectopic      Multiple  0   Live Births  4            Home Medications    Prior to Admission medications   Medication Sig Start Date End Date Taking? Authorizing Provider  cetirizine (ZYRTEC) 10 MG tablet Take 1 tablet (10 mg total) by mouth daily. Patient not taking: Reported on 10/07/2016 05/16/16   Julieanne Manson, MD  ferrous sulfate 325 (65 FE) MG tablet Take 1 tablet (325 mg total) by mouth 2 (two) times daily with a meal. 04/20/15   Adam Phenix, MD  folic acid (FOLVITE) 1 MG tablet Take 2 tablets (2 mg total) by mouth daily. Patient not taking: Reported on 05/16/2016 02/19/15  Anyanwu, Jethro Bastos, MD  ibuprofen (ADVIL,MOTRIN) 200 MG tablet Take 200 mg by mouth every 6 (six) hours as needed for mild pain.    [provider]  ibuprofen (ADVIL,MOTRIN) 600 MG tablet Take 1 tablet (600 mg total) by mouth every 6 (six) hours. Patient not taking: Reported on 05/16/2016 07/27/15   Dorathy Kinsman, CNM    Family History Family History  Problem Relation Age of Onset  . Cancer Mother        Hepatic carcinoma?  No history of viral hepatitis she is aware of  . Diabetes Maternal Grandmother   . Anesthesia problems Neg Hx     Social History Social History   Tobacco Use  . Smoking status: Never Smoker  . Smokeless tobacco: Never Used  Substance Use Topics  . Alcohol use: No  . Drug use: No     Allergies   Patient has no known  allergies.   Review of Systems Review of Systems  Constitutional: Positive for appetite change. Negative for chills and fever.  Eyes: Negative for visual disturbance.  Respiratory: Negative for shortness of breath.   Cardiovascular: Negative for chest pain.  Gastrointestinal: Positive for abdominal distention (bilateral lower). Negative for blood in stool, constipation, diarrhea, nausea and vomiting.  Genitourinary: Negative for difficulty urinating, dysuria, flank pain, frequency, genital sores, hematuria, vaginal bleeding and vaginal discharge.  Musculoskeletal: Positive for back pain (pain radiates to bilateral lower back).  Skin: Negative for rash.  Neurological: Negative for weakness, light-headedness, numbness and headaches.  Psychiatric/Behavioral: Negative for agitation.     Physical Exam Updated Vital Signs BP 122/81 (BP Location: Right Arm)   Pulse 80   Temp 98.1 F (36.7 C) (Oral)   Resp 16   SpO2 100%   Physical Exam  Constitutional: She is oriented to person, place, and time. She appears well-developed and well-nourished. No distress.  Patient curled up into a ball at bedside, appears painful.  HENT:  Head: Normocephalic and atraumatic.  Mouth/Throat: No oropharyngeal exudate.  Mucous memories moist.  Eyes: Pupils are equal, round, and reactive to light. Conjunctivae are normal. Right eye exhibits no discharge. Left eye exhibits no discharge. No scleral icterus.  Neck: Normal range of motion. Neck supple.  Cardiovascular: Normal rate, regular rhythm and intact distal pulses. Exam reveals no friction rub.  No murmur heard. Pulmonary/Chest: Effort normal and breath sounds normal. No stridor. No respiratory distress. She has no wheezes. She has no rales.  Abdominal:  Abdomen soft and non-distended.  She is acutely tender to palpation over right lower quadrant, left lower quadrant and suprapubic area.  No guarding or rigidity.  No rebound tenderness.  Negative  Murphy's sign.  No CVA tenderness.  Musculoskeletal:  Tender to palpation over bilateral paraspinal muscles of the lumbar spine.  No midline T-spine or L-spine tenderness.  No rash or bruising noted over the back.  Neurological: She is alert and oriented to person, place, and time. Coordination normal.  Skin: Skin is warm and dry. She is not diaphoretic.  Psychiatric: She has a normal mood and affect. Her behavior is normal.  Nursing note and vitals reviewed.    ED Treatments / Results  Labs (all labs ordered are listed, but only abnormal results are displayed) Labs Reviewed  COMPREHENSIVE METABOLIC PANEL - Abnormal; Notable for the following components:      Result Value   Glucose, Bld 106 (*)    Calcium 8.7 (*)    All other components within normal limits  CBC -  Abnormal; Notable for the following components:   RBC 3.60 (*)    Hemoglobin 9.4 (*)    HCT 27.2 (*)    MCV 75.6 (*)    RDW 18.4 (*)    All other components within normal limits  URINALYSIS, ROUTINE W REFLEX MICROSCOPIC - Abnormal; Notable for the following components:   APPearance CLOUDY (*)    Hgb urine dipstick SMALL (*)    Leukocytes, UA LARGE (*)    Bacteria, UA RARE (*)    All other components within normal limits  WET PREP, GENITAL  LIPASE, BLOOD  I-STAT BETA HCG BLOOD, ED ([facility], [facility], [facility] ONLY)  GC/CHLAMYDIA PROBE AMP () NOT AT [facility]    EKG None  Radiology No results found.  Procedures Procedures (including critical care time)  Medications Ordered in ED Medications  sodium chloride 0.9 % bolus 1,000 mL (0 mLs Intravenous Stopped 04/30/17 1153)  morphine 4 MG/ML injection 4 mg (4 mg Intravenous Given 04/30/17 1124)  ondansetron (ZOFRAN) injection 4 mg (4 mg Intravenous Given 04/30/17 1123)  iopamidol (ISOVUE-300) 61 % injection (100 mLs  Contrast Given 04/30/17 1303)     Initial Impression / Assessment and Plan / ED Course  I have reviewed the triage vital signs and the nursing  notes.  Pertinent labs & imaging results that were available during my care of the patient were reviewed by me and considered in my medical decision making (see chart for details).     Patient presents to the emergency department for evaluation of bilateral lower abdominal cramping which radiates to the back.  She denies nausea/vomiting, fever, diarrhea, urinary symptoms or new vaginal discharge.  She has no history of prior abdominal surgeries. Has a history of previous cervical conization 10/17 and has not had a menstrual period in several years.   On exam patient is afebrile and nontoxic-appearing.  She is curled up, clearly in pain.  Abdomen acutely tender to palpation in RLQ, LLQ and suprapubic area.  No peritoneal signs.  Pelvic exam reveals white milky discharge.  No cervical motion tenderness or adnexal tenderness. No vaginal bleeding.   Lab work reviewed.  CBC reveals anemia (9.4), although this is chronic for patient and actually appears improved from baseline.  No leukocytosis.  CMP without major electrolyte abnormalities.  Lipase negative.  Beta hCG negative.  UA appears contaminated, and patient denies urinary symptoms. Wet prep with clue cells present. GC/Chlamydia pending.  Given patient's acute tenderness to palpation on exam, will get CT scan for further evaluation.  Fluid bolus, antiemetic and pain medication ordered.  CT scan reveals abnormally dilated endometrial and endocervical canal measuring up to 4 cm, probable hematometra.  Discussed this patient with on-call OB GYN Dr. Emelda Fear who recommends referral to [facility] gynecology clinic, where she can be assessed for cervical dilation which may be possible in the office setting or may require formal D&C under anesthesia.  Patient will get follow up call to schedule appointment in the next 48hrs. He recommends transvaginal ultrasound in the ED for further evaluation of the material inside of the uterus so that the process of managing  the hematometra in the outpatient setting is expedited.  Ultrasound ordered.  Discussed this plan with patient who agrees and voiced understanding to the above plan.  On recheck she reports her abdominal pain is improved will discharge home with short course of pain management.  Will also prescribe flagyl for her bv. Waiting on [place] at shift change. Hand off given to PA  Sophia Caccavale for disposition once [place] returns.   Final Clinical Impressions(s) / ED Diagnoses   Final diagnoses:  Hematometra    ED Discharge Orders        Ordered    oxyCODONE-acetaminophen (PERCOCET/ROXICET) 5-325 MG tablet  Every 4 hours PRN     04/30/17 1546    metroNIDAZOLE (FLAGYL) 500 MG tablet  2 times daily     04/30/17 1623       Lawrence Marseilles 05/01/17 4098    Lorre Nick, MD 05/02/17 1312

## 2017-04-30 NOTE — Consult Note (Signed)
Gyn Note:  Case discussed: CT findings of apparent hematometra, as patient is s/p conization of cervix and apparently stenosis of cervix has resulted.  Referral to Gyn clinic has been initiated, I've sent a note to our Administrative Pool, for a followup call to patient to be done, to have patient seen and assessed for cervical dilation, which may at times be possible in the office setting , or may require formal D&C under anesthesia. Agricultural engineer number [phone].  Tilda Burrow, MD Attending Physician on call Faculty Practice [phone]

## 2017-04-30 NOTE — Discharge Instructions (Addendum)
The scan shows that you have buildup of fluid in your uterus.  You will need to be seen by the gynecologist for this.  You will receive a phone call in the next 48 hours to schedule an appointment at the women's clinic.  If you do not receive a phone call, please call [phone].  Please take pain medication at home as needed.  Remember that this medicine can make you drowsy and you should not drive or work while taking it.  Please also take antibiotic twice a day for the next 7 days for your vaginal discharge (Flagyl.)   Return to the emergency department if you have any new or concerning symptoms like vomiting that will not stop, fever greater than 100.4 F.

## 2017-05-01 ENCOUNTER — Telehealth: Payer: Self-pay | Admitting: General Practice

## 2017-05-01 LAB — GC/CHLAMYDIA PROBE AMP (~~LOC~~) NOT AT ARMC
Chlamydia: NEGATIVE
Neisseria Gonorrhea: NEGATIVE

## 2017-05-01 LAB — URINE CULTURE

## 2017-05-01 NOTE — Telephone Encounter (Signed)
Patient called back into office stating she has an appt on 5/9 but she is hurting bad and the medicine she got in the ER isn't working. Moved patient up to 5/1 for an appt & informed her of change in appt time. Also recommended she take Rx strength ibuprofen or percocet prior to appt. Patient verbalized understanding & had no questions

## 2017-05-01 NOTE — Telephone Encounter (Signed)
Patient called & left message on nurse voicemail line requesting a call back. Called patient, no answer- left message on voicemail stating we are trying to reach you to return your phone call, please call us back

## 2017-05-03 ENCOUNTER — Ambulatory Visit (INDEPENDENT_AMBULATORY_CARE_PROVIDER_SITE_OTHER): Payer: Self-pay | Admitting: Obstetrics and Gynecology

## 2017-05-03 ENCOUNTER — Encounter (HOSPITAL_COMMUNITY): Payer: Self-pay

## 2017-05-03 ENCOUNTER — Encounter: Payer: Self-pay | Admitting: *Deleted

## 2017-05-03 ENCOUNTER — Encounter: Payer: Self-pay | Admitting: Obstetrics and Gynecology

## 2017-05-03 VITALS — BP 104/67 | HR 61 | Ht 67.0 in | Wt 209.1 lb

## 2017-05-03 DIAGNOSIS — N857 Hematometra: Secondary | ICD-10-CM

## 2017-05-03 LAB — POCT PREGNANCY, URINE: PREG TEST UR: NEGATIVE
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
## 2017-05-04 ENCOUNTER — Other Ambulatory Visit: Payer: Self-pay

## 2017-05-04 ENCOUNTER — Encounter (HOSPITAL_BASED_OUTPATIENT_CLINIC_OR_DEPARTMENT_OTHER): Payer: Self-pay | Admitting: *Deleted

## 2017-05-09 ENCOUNTER — Encounter (HOSPITAL_BASED_OUTPATIENT_CLINIC_OR_DEPARTMENT_OTHER)
Admission: RE | Admit: 2017-05-09 | Discharge: 2017-05-09 | Disposition: A | Discharge status: Home / Self Care | Payer: Self-pay | Source: Ambulatory Visit | Attending: Obstetrics and Gynecology | Admitting: Obstetrics and Gynecology

## 2017-05-09 DIAGNOSIS — Z01812 Encounter for preprocedural laboratory examination: Secondary | ICD-10-CM | POA: Insufficient documentation

## 2017-05-09 LAB — CBC
HCT: 26.7 % — ABNORMAL LOW (ref 36.0–46.0)
Hemoglobin: 9.2 g/dL — ABNORMAL LOW (ref 12.0–15.0)
MCH: 25.9 pg — AB (ref 26.0–34.0)
MCHC: 34.5 g/dL (ref 30.0–36.0)
MCV: 75.2 fL — ABNORMAL LOW (ref 78.0–100.0)
Platelets: 245 10*3/uL (ref 150–400)
RBC: 3.55 MIL/uL — AB (ref 3.87–5.11)
RDW: 18.4 % — AB (ref 11.5–15.5)
WBC: 6.1 10*3/uL (ref 4.0–10.5)

## 2017-05-09 LAB — BASIC METABOLIC PANEL
ANION GAP: 5 (ref 5–15)
CALCIUM: 9 mg/dL (ref 8.9–10.3)
CO2: 28 mmol/L (ref 22–32)
CREATININE: 0.59 mg/dL (ref 0.44–1.00)
Chloride: 108 mmol/L (ref 101–111)
GFR calc Af Amer: >60 mL/min (ref >60)
GLUCOSE: 92 mg/dL (ref 65–99)
POTASSIUM: 4.3 mmol/L (ref 3.5–5.1)
SODIUM: 141 mmol/L (ref 135–145)

## 2017-05-09 LAB — POCT PREGNANCY, URINE: PREG TEST UR: NEGATIVE

## 2017-05-10 ENCOUNTER — Ambulatory Visit (HOSPITAL_BASED_OUTPATIENT_CLINIC_OR_DEPARTMENT_OTHER): Payer: Self-pay | Admitting: Anesthesiology

## 2017-05-10 ENCOUNTER — Encounter (HOSPITAL_BASED_OUTPATIENT_CLINIC_OR_DEPARTMENT_OTHER)
Admission: RE | Disposition: A | Discharge status: Home / Self Care | Payer: Self-pay | Source: Ambulatory Visit | Attending: Obstetrics and Gynecology

## 2017-05-10 ENCOUNTER — Encounter (HOSPITAL_BASED_OUTPATIENT_CLINIC_OR_DEPARTMENT_OTHER): Payer: Self-pay | Admitting: Emergency Medicine

## 2017-05-10 ENCOUNTER — Other Ambulatory Visit: Payer: Self-pay

## 2017-05-10 ENCOUNTER — Ambulatory Visit (HOSPITAL_BASED_OUTPATIENT_CLINIC_OR_DEPARTMENT_OTHER)
Admission: RE | Admit: 2017-05-10 | Discharge: 2017-05-10 | Disposition: A | Discharge status: Home / Self Care | Payer: Self-pay | Source: Ambulatory Visit | Attending: Obstetrics and Gynecology | Admitting: Obstetrics and Gynecology

## 2017-05-10 DIAGNOSIS — Z79899 Other long term (current) drug therapy: Secondary | ICD-10-CM | POA: Insufficient documentation

## 2017-05-10 DIAGNOSIS — R51 Headache: Secondary | ICD-10-CM | POA: Insufficient documentation

## 2017-05-10 DIAGNOSIS — N840 Polyp of corpus uteri: Secondary | ICD-10-CM | POA: Insufficient documentation

## 2017-05-10 DIAGNOSIS — D5720 Sickle-cell/Hb-C disease without crisis: Secondary | ICD-10-CM | POA: Insufficient documentation

## 2017-05-10 DIAGNOSIS — Z809 Family history of malignant neoplasm, unspecified: Secondary | ICD-10-CM | POA: Insufficient documentation

## 2017-05-10 DIAGNOSIS — N857 Hematometra: Secondary | ICD-10-CM

## 2017-05-10 DIAGNOSIS — Z833 Family history of diabetes mellitus: Secondary | ICD-10-CM | POA: Insufficient documentation

## 2017-05-10 HISTORY — PX: HYSTEROSCOPY W/D&C: SHX1775

## 2017-05-10 SURGERY — DILATATION AND CURETTAGE /HYSTEROSCOPY
Anesthesia: General | Site: Vagina

## 2017-05-10 MED ORDER — OXYCODONE HCL 5 MG/5ML PO SOLN: 5.0000 mg | Freq: Once | ORAL | Status: DC | PRN

## 2017-05-10 MED ORDER — OXYCODONE-ACETAMINOPHEN 5-325 MG PO TABS: 1.0000 | ORAL_TABLET | Freq: Four times a day (QID) | ORAL | 0 refills | Status: DC | PRN

## 2017-05-10 MED ORDER — IBUPROFEN 600 MG PO TABS: 600.0000 mg | ORAL_TABLET | Freq: Four times a day (QID) | ORAL | 1 refills | Status: DC | PRN

## 2017-05-10 MED ORDER — DEXAMETHASONE SODIUM PHOSPHATE 10 MG/ML IJ SOLN
INTRAMUSCULAR | Status: DC | PRN
  Administered 2017-05-10: 10 mg via INTRAVENOUS

## 2017-05-10 MED ORDER — FENTANYL CITRATE (PF) 100 MCG/2ML IJ SOLN
INTRAMUSCULAR | Status: AC
  Filled 2017-05-10: qty 2

## 2017-05-10 MED ORDER — ONDANSETRON HCL 4 MG/2ML IJ SOLN
INTRAMUSCULAR | Status: AC
  Filled 2017-05-10: qty 2

## 2017-05-10 MED ORDER — LACTATED RINGERS IV SOLN
INTRAVENOUS | Status: DC
  Administered 2017-05-10 (×2): via INTRAVENOUS

## 2017-05-10 MED ORDER — LIDOCAINE 2% (20 MG/ML) 5 ML SYRINGE
INTRAMUSCULAR | Status: DC | PRN
  Administered 2017-05-10: 100 mg via INTRAVENOUS

## 2017-05-10 MED ORDER — SODIUM CHLORIDE 0.9 % IR SOLN
Status: DC | PRN
  Administered 2017-05-10: 3000 mL

## 2017-05-10 MED ORDER — FENTANYL CITRATE (PF) 100 MCG/2ML IJ SOLN
INTRAMUSCULAR | Status: DC | PRN
  Administered 2017-05-10 (×2): 50 ug via INTRAVENOUS

## 2017-05-10 MED ORDER — ONDANSETRON HCL 4 MG/2ML IJ SOLN: 4.0000 mg | Freq: Once | INTRAMUSCULAR | Status: DC | PRN

## 2017-05-10 MED ORDER — MIDAZOLAM HCL 5 MG/5ML IJ SOLN
INTRAMUSCULAR | Status: DC | PRN
  Administered 2017-05-10: 2 mg via INTRAVENOUS

## 2017-05-10 MED ORDER — FENTANYL CITRATE (PF) 100 MCG/2ML IJ SOLN: 25.0000 ug | INTRAMUSCULAR | Status: DC | PRN

## 2017-05-10 MED ORDER — MIDAZOLAM HCL 2 MG/2ML IJ SOLN
INTRAMUSCULAR | Status: AC
  Filled 2017-05-10: qty 2

## 2017-05-10 MED ORDER — FENTANYL CITRATE (PF) 100 MCG/2ML IJ SOLN: 50.0000 ug | INTRAMUSCULAR | Status: DC | PRN

## 2017-05-10 MED ORDER — OXYCODONE HCL 5 MG PO TABS: 5.0000 mg | ORAL_TABLET | Freq: Once | ORAL | Status: DC | PRN

## 2017-05-10 MED ORDER — MEPERIDINE HCL 25 MG/ML IJ SOLN: 6.2500 mg | INTRAMUSCULAR | Status: DC | PRN

## 2017-05-10 MED ORDER — KETOROLAC TROMETHAMINE 30 MG/ML IJ SOLN
INTRAMUSCULAR | Status: AC
  Filled 2017-05-10: qty 1

## 2017-05-10 MED ORDER — LACTATED RINGERS IV SOLN: INTRAVENOUS | Status: DC

## 2017-05-10 MED ORDER — MIDAZOLAM HCL 2 MG/2ML IJ SOLN: 1.0000 mg | INTRAMUSCULAR | Status: DC | PRN

## 2017-05-10 MED ORDER — ACETAMINOPHEN 325 MG PO TABS: 325.0000 mg | ORAL_TABLET | ORAL | Status: DC | PRN

## 2017-05-10 MED ORDER — ONDANSETRON HCL 4 MG/2ML IJ SOLN
INTRAMUSCULAR | Status: DC | PRN
  Administered 2017-05-10: 4 mg via INTRAVENOUS

## 2017-05-10 MED ORDER — ACETAMINOPHEN 160 MG/5ML PO SOLN: 325.0000 mg | ORAL | Status: DC | PRN

## 2017-05-10 MED ORDER — PROPOFOL 10 MG/ML IV BOLUS
INTRAVENOUS | Status: DC | PRN
  Administered 2017-05-10: 150 mg via INTRAVENOUS

## 2017-05-10 MED ORDER — DEXAMETHASONE SODIUM PHOSPHATE 10 MG/ML IJ SOLN
INTRAMUSCULAR | Status: AC
  Filled 2017-05-10: qty 1

## 2017-05-10 MED ORDER — SCOPOLAMINE 1 MG/3DAYS TD PT72: 1.0000 | MEDICATED_PATCH | Freq: Once | TRANSDERMAL | Status: DC | PRN

## 2017-05-10 MED ORDER — KETOROLAC TROMETHAMINE 30 MG/ML IJ SOLN
INTRAMUSCULAR | Status: DC | PRN
  Administered 2017-05-10: 30 mg via INTRAVENOUS

## 2017-05-10 SURGICAL SUPPLY — 17 items
BRIEF STRETCH FOR OB PAD XXL (UNDERPADS AND DIAPERS) ×3 IMPLANT
CANISTER AQUILEX FLUID KIT (CANNISTER) ×3 IMPLANT
CATH ROBINSON RED A/P 16FR (CATHETERS) ×3 IMPLANT
FILTER ARTHROSCOPY CONVERTOR (FILTER) ×3 IMPLANT
GLOVE BIOGEL PI IND STRL 6.5 (GLOVE) ×1 IMPLANT
GLOVE BIOGEL PI IND STRL 7.0 (GLOVE) ×1 IMPLANT
GLOVE BIOGEL PI INDICATOR 6.5 (GLOVE) ×2
GLOVE BIOGEL PI INDICATOR 7.0 (GLOVE) ×2
GOWN STRL REUS W/TWL LRG LVL3 (GOWN DISPOSABLE) ×6 IMPLANT
IV NS IRRIG 3000ML ARTHROMATIC (IV SOLUTION) ×3 IMPLANT
PACK VAGINAL MINOR WOMEN LF (CUSTOM PROCEDURE TRAY) ×3 IMPLANT
PAD OB MATERNITY 4.3X12.25 (PERSONAL CARE ITEMS) ×3 IMPLANT
PAD PREP 24X48 CUFFED NSTRL (MISCELLANEOUS) ×3 IMPLANT
SLEEVE SCD COMPRESS KNEE MED (MISCELLANEOUS) ×3 IMPLANT
TOWEL OR 17X24 6PK STRL BLUE (TOWEL DISPOSABLE) ×6 IMPLANT
TUBING AQUILEX INFLOW (TUBING) ×3 IMPLANT
TUBING AQUILEX OUTFLOW (TUBING) ×3 IMPLANT

## 2017-05-10 NOTE — Anesthesia Postprocedure Evaluation (Signed)
Anesthesia Post Note  Patient: Chelisa Tello  Procedure(s) Performed: DILATATION AND CURETTAGE /HYSTEROSCOPY (N/A Vagina )     Patient location during evaluation: PACU Anesthesia Type: General Level of consciousness: awake and alert Pain management: pain level controlled Vital Signs Assessment: post-procedure vital signs reviewed and stable Respiratory status: spontaneous breathing, nonlabored ventilation, respiratory function stable and patient connected to nasal cannula oxygen Cardiovascular status: blood pressure returned to baseline and stable Postop Assessment: no apparent nausea or vomiting Anesthetic complications: no    Last Vitals:  Vitals:   05/10/17 1515 05/10/17 1530  BP: 110/75 (!) 128/93  Pulse: 65 73  Resp: 14 13  Temp: (!) 36.4 C   SpO2: 100% 100%    Last Pain:  Vitals:   05/10/17 1530  TempSrc:   PainSc: 0-No pain                 Morningstar Toft

## 2017-05-10 NOTE — Anesthesia Procedure Notes (Signed)
Procedure Name: LMA Insertion Date/Time: 05/10/2017 2:34 PM Performed by: Marny Lowenstein, CRNA Pre-anesthesia Checklist: Patient identified, Emergency Drugs available, Suction available, Patient being monitored and Timeout performed Patient Re-evaluated:Patient Re-evaluated prior to induction Oxygen Delivery Method: Circle system utilized Preoxygenation: Pre-oxygenation with 100% oxygen Induction Type: IV induction Ventilation: Mask ventilation without difficulty LMA: LMA inserted LMA Size: 4.0 Number of attempts: 1 Placement Confirmation: positive ETCO2,  CO2 detector and breath sounds checked- equal and bilateral Tube secured with: Tape Dental Injury: Teeth and Oropharynx as per pre-operative assessment
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
## 2017-05-10 NOTE — Op Note (Signed)
Jazsmin Raczkowski PROCEDURE DATE: 05/10/2017   PREOPERATIVE DIAGNOSIS:  hematometra  POSTOPERATIVE DIAGNOSIS: proliferative endometrial lining  PROCEDURE: Hysteroscopic dilation and curettage  SURGEON:  Dr. Baldemar Lenis  ASSISTANT:  none  ANESTHESIOLOGY TEAM: Anesthesiologist: Bethena Midget, MD CRNA: Marny Lowenstein, CRNA  INDICATIONS: 37 y.o. [phone]  here for scheduled surgery for the aforementioned diagnoses.   Risks of surgery were discussed with the patient including but not limited to: bleeding which may require transfusion; infection which may require antibiotics; injury to uterus or surrounding organs; intrauterine scarring which may impair future fertility; need for additional procedures including laparotomy or laparoscopy; and other postoperative/anesthesia complications. Written informed consent was obtained.    FINDINGS: Normal appearing female genitalia with multiparous appearing cervix with evidence of prior stenosis and dilation.  10 weeks size uterus. Cervix easily dilated, no large clots or large amount of blood in uterus or cervix, diffuse proliferative endometrium.  Normal ostia bilaterally.  ANESTHESIA:   General INTRAVENOUS FLUIDS:  1200 ml of LR ESTIMATED BLOOD LOSS:  5 ml URINE OUTPUT: unmeasured void SPECIMENS: endometrial curettings sent to pathology COMPLICATIONS:  None immediate.  PROCEDURE DETAILS:  The patient was seen in the pre-op area where the plan was reviewed and she again verbalized understanding and consent.  She emptied her bladder. She was then taken to the operating room where general anesthesia was administered.  After an adequate timeout was performed, she was placed in the dorsal lithotomy position and examined; then prepped and draped in the sterile manner. A weighted speculum was then placed in the patient's vagina and a Sims retractor used to visualize the cervix. A single toothed tenaculum was applied to the anterior lip of the cervix.   The cervix then dilated manually with metal dilators to accommodate the diagnostic hysteroscope.  Once the cervix was dilated, the ringed forceps were gently introduced into the cervical canal to remove any tissue there and none noted. The hysteroscope was then inserted under direct visualization using normal saline as a suspension medium.  The uterine cavity was carefully examined with the findings as noted above. There were no large clots noted within uterus, diffuse proliferative endometrium noted throughout, normal appearing ostia bilaterally. The hysteroscope was removed.  A sharp curettage was then performed to obtain a moderate amount of endometrial curettings. Once the curetting was completed, the cavity was again viewed and noted to be free of other pathology. The hysteroscope was then removed under direct visualization. The tenaculum was removed from the anterior lip of the cervix with good hemostasis noted at the tenaculum site. All instruments were remvoed from the vagina.  The patient tolerated the procedure well and was taken to the recovery area awake, extubated and in stable condition.  The patient will be discharged to home as per PACU criteria.  Routine postoperative instructions given.  She was prescribed Percocet, Ibuprofen.  She will follow up in the clinic in 2 weeks  for postoperative evaluation.    Baldemar Lenis, M.D. Attending Obstetrician & Gynecologist, [facility] for [employer], [facility]

## 2017-05-10 NOTE — Transfer of Care (Signed)
Immediate Anesthesia Transfer of Care Note  Patient: Taylor Roman  Procedure(s) Performed: DILATATION AND CURETTAGE /HYSTEROSCOPY (N/A Vagina )  Patient Location: PACU  Anesthesia Type:General  Level of Consciousness: drowsy  Airway & Oxygen Therapy: Patient Spontanous Breathing and Patient connected to face mask oxygen  Post-op Assessment: Report given to RN and Post -op Vital signs reviewed and stable  Post vital signs: Reviewed and stable  Last Vitals:  Vitals Value Taken Time  BP 108/71 05/10/2017  3:12 PM  Temp    Pulse 66 05/10/2017  3:13 PM  Resp 14 05/10/2017  3:13 PM  SpO2 100 % 05/10/2017  3:13 PM  Vitals shown include unvalidated device data.  Last Pain:  Vitals:   05/10/17 1302  TempSrc: Oral  PainSc: 0-No pain         Complications: No apparent anesthesia complications

## 2017-05-10 NOTE — Anesthesia Preprocedure Evaluation (Signed)
Anesthesia Evaluation  Patient identified by MRN, date of birth, ID band Patient awake    Reviewed: Allergy & Precautions, NPO status , Patient's Chart, lab work & pertinent test results  History of Anesthesia Complications Negative for: history of anesthetic complications  Airway Mallampati: II  TM Distance: >3 FB Neck ROM: Full    Dental  (+) Dental Advisory Given,    Pulmonary neg pulmonary ROS,    Pulmonary exam normal breath sounds clear to auscultation       Cardiovascular negative cardio ROS   Rhythm:Regular Rate:Normal     Neuro/Psych  Headaches,    GI/Hepatic negative GI ROS, Neg liver ROS,   Endo/Other  negative endocrine ROS  Renal/GU negative Renal ROS     Musculoskeletal   Abdominal (+) + obese,   Peds  Hematology  (+) 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), anemia , Sickle cell-hemoglobin C disease    Anesthesia Other Findings   Reproductive/Obstetrics                             Anesthesia Physical  Anesthesia Plan  ASA: II  Anesthesia Plan: General   Post-op Pain Management:    Induction: Intravenous  PONV Risk Score and Plan: 2 and Ondansetron and Treatment may vary due to age or medical condition  Airway Management Planned: LMA  Additional Equipment:   Intra-op Plan:   Post-operative Plan: Extubation in OR  Informed Consent: I have reviewed the patients History and Physical, chart, labs and discussed the procedure including the risks, benefits and alternatives for the proposed anesthesia with the patient or authorized representative who has indicated his/her understanding and acceptance.   Dental advisory given  Plan Discussed with: CRNA, Anesthesiologist and Surgeon  Anesthesia Plan Comments:         Anesthesia Quick Evaluation

## 2017-05-10 NOTE — Discharge Instructions (Signed)
No Ibuprofen (Advil) until 10:00pm if needed  Dilation and Curettage, Care After These instructions give you information about caring for yourself after your procedure. Your doctor may also give you more specific instructions. Call your doctor if you have any problems or questions after your procedure. Follow these instructions at home: Activity  Do not drive or use heavy machinery while taking prescription pain medicine.  For 24 hours after your procedure, avoid driving.  Take short walks often, followed by rest periods. Ask your doctor what activities are safe for you. After one or two days, you may be able to return to your normal activities.  Do not lift anything that is heavier than 10 lb (4.5 kg) until your doctor approves.  For at least 2 weeks, or as long as told by your doctor: ? Do not douche. ? Do not use tampons. ? Do not have sex. General instructions  Take over-the-counter and prescription medicines only as told by your doctor. This is very important if you take blood thinning medicine.  Do not take baths, swim, or use a hot tub until your doctor approves. Take showers instead of baths.  Wear compression stockings as told by your doctor.  It is up to you to get the results of your procedure. Ask your doctor when your results will be ready.  Keep all follow-up visits as told by your doctor. This is important. Contact a doctor if:  You have very bad cramps that get worse or do not get better with medicine.  You have very bad pain in your belly (abdomen).  You cannot drink fluids without throwing up (vomiting).  You get pain in a different part of the area between your belly and thighs (pelvis).  You have bad-smelling discharge from your vagina.  You have a rash. Get help right away if:  You are bleeding a lot from your vagina. A lot of bleeding means soaking more than one sanitary pad in an hour, for 2 hours in a row.  You have clumps of blood (blood clots)  coming from your vagina.  You have a fever or chills.  Your belly feels very tender or hard.  You have chest pain.  You have trouble breathing.  You cough up blood.  You feel dizzy.  You feel light-headed.  You pass out (faint).  You have pain in your neck or shoulder area. Summary  Take short walks often, followed by rest periods. Ask your doctor what activities are safe for you. After one or two days, you may be able to return to your normal activities.  Do not lift anything that is heavier than 10 lb (4.5 kg) until your doctor approves.  Do not take baths, swim, or use a hot tub until your doctor approves. Take showers instead of baths.  Contact your doctor if you have any symptoms of infection, like bad-smelling discharge from your vagina. This information is not intended to replace advice given to you by your health care provider. Make sure you discuss any questions you have with your health care provider. Document Released: 09/29/2007 Document Revised: 09/07/2015 Document Reviewed: 09/07/2015 Elsevier Interactive Patient Education  2017 [employer]   Post Anesthesia Home Care Instructions  Activity: Get plenty of rest for the remainder of the day. A responsible individual must stay with you for 24 hours following the procedure.  For the next 24 hours, DO NOT: -Drive a car -Advertising copywriter -Drink alcoholic beverages -Take any medication unless instructed by your physician -  Make any legal decisions or sign important papers.  Meals: Start with liquid foods such as gelatin or soup. Progress to regular foods as tolerated. Avoid greasy, spicy, heavy foods. If nausea and/or vomiting occur, drink only clear liquids until the nausea and/or vomiting subsides. Call your physician if vomiting continues.  Special Instructions/Symptoms: Your throat may feel dry or sore from the anesthesia or the breathing tube placed in your throat during surgery. If this causes  discomfort, gargle with warm salt water. The discomfort should disappear within 24 hours.  If you had a scopolamine patch placed behind your ear for the management of post- operative nausea and/or vomiting:  1. The medication in the patch is effective for 72 hours, after which it should be removed.  Wrap patch in a tissue and discard in the trash. Wash hands thoroughly with soap and water. 2. You may remove the patch earlier than 72 hours if you experience unpleasant side effects which may include dry mouth, dizziness or visual disturbances. 3. Avoid touching the patch. Wash your hands with soap and water after contact with the patch.

## 2017-05-11 ENCOUNTER — Encounter (HOSPITAL_BASED_OUTPATIENT_CLINIC_OR_DEPARTMENT_OTHER): Payer: Self-pay | Admitting: Obstetrics and Gynecology

## 2017-05-11 ENCOUNTER — Ambulatory Visit: Payer: Self-pay | Admitting: Obstetrics and Gynecology

## 2017-06-05 ENCOUNTER — Encounter: Payer: Self-pay | Admitting: Obstetrics and Gynecology

## 2017-06-05 ENCOUNTER — Ambulatory Visit: Payer: Self-pay | Admitting: Obstetrics and Gynecology

## 2017-06-05 VITALS — BP 117/73 | HR 63 | Ht 67.0 in | Wt 209.2 lb

## 2017-06-05 DIAGNOSIS — Z30011 Encounter for initial prescription of contraceptive pills: Secondary | ICD-10-CM

## 2017-06-05 DIAGNOSIS — N840 Polyp of corpus uteri: Secondary | ICD-10-CM

## 2017-06-05 DIAGNOSIS — Z9889 Other specified postprocedural states: Secondary | ICD-10-CM

## 2017-06-05 MED ORDER — NORGESTIMATE-ETH ESTRADIOL 0.25-35 MG-MCG PO TABS: 1.0000 | ORAL_TABLET | Freq: Every day | ORAL | 3 refills | Status: DC

## 2017-06-05 NOTE — Progress Notes (Signed)
GYNECOLOGY OFFICE FOLLOW UP NOTE  History:  37 y.o. [id number] here today for follow up for hysteroscopy D&C on 05/10/17 for hematometra and polyp. Hematometra found to be resolved by office cervical dilation during D&C.  LMP 05/31/17, lasted 5 days. Reports pain after procedure lasted about 3 days and has been okay since then.  Is considering pregnancy, does not want to continue depo. Wants something for contraception for now.  Past Medical History:  Diagnosis Date  . Anemia    States has sickle cell trait, father of child without sickle cell trait  . Headache    otc med prn  . 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 Vaginal Pap smear, abnormal    10/2015 cervical conization with biopsy.  To have repeat pap in JUne 2018 at [facility]    Past Surgical History:  Procedure Laterality Date  . CERVICAL CONIZATION W/BX N/A 11/03/2015   Procedure: CONIZATION CERVIX WITH BIOPSY;  Surgeon:  Bing, MD;  Location: [facility];  Service: Gynecology;  Laterality: N/A;  . HYSTEROSCOPY W/D&C N/A 05/10/2017   Procedure: DILATATION AND CURETTAGE /HYSTEROSCOPY;  Surgeon: Conan Bowens, MD;  Location: [facility];  Service: Gynecology;  Laterality: N/A;  . NO PAST SURGERIES      Current Outpatient Medications:  .  ibuprofen (ADVIL,MOTRIN) 600 MG tablet, Take 1 tablet (600 mg total) by mouth every 6 (six) hours as needed., Disp: 30 tablet, Rfl: 1 .  norgestimate-ethinyl estradiol (ORTHO-CYCLEN,SPRINTEC,PREVIFEM) 0.25-35 MG-MCG tablet, Take 1 tablet by mouth daily., Disp: 3 Package, Rfl: 3 .  oxyCODONE-acetaminophen (PERCOCET/ROXICET) 5-325 MG tablet, Take 1-2 tablets by mouth every 6 (six) hours as needed. (Patient not taking: Reported on 06/05/2017), Disp: 10 tablet, Rfl: 0  The following portions of the patient's history were reviewed and updated as appropriate: allergies, current medications, past family history, past medical history, past social history, past surgical  history and problem list.   Review of Systems:  Pertinent items noted in HPI and remainder of comprehensive ROS otherwise negative.   Objective:  Physical Exam BP 117/73   Pulse 63   Ht 5\' 7"  (1.702 m)   Wt 209 lb 3.2 oz (94.9 kg)   BMI 32.77 kg/m  CONSTITUTIONAL: Well-developed, well-nourished female in no acute distress.  HENT:  Normocephalic, atraumatic. External right and left ear normal. Oropharynx is clear and moist EYES: Conjunctivae and EOM are normal. Pupils are equal, round, and reactive to light. No scleral icterus.  NECK: Normal range of motion, supple, no masses SKIN: Skin is warm and dry. No rash noted. Not diaphoretic. No erythema. No pallor. NEUROLOGIC: Alert and oriented to person, place, and time. Normal reflexes, muscle tone coordination. No cranial nerve deficit noted. PSYCHIATRIC: Normal mood and affect. Normal behavior. Normal judgment and thought content. CARDIOVASCULAR: Normal heart rate noted RESPIRATORY: Effort normal, no problems with respiration noted ABDOMEN: Soft, no distention noted.   PELVIC: Deferred MUSCULOSKELETAL: Normal range of motion. No edema noted.  Labs and Imaging No results found.  Assessment & Plan:   1. Post-operative state S/p D&C hysteroscopy 5/8 No issues  2. Endometrial polyp Benign path, reviewed with patient  3. Encounter for initial prescription of contraceptive pills - Pt previously on depo, does not want to continue, wants something where she will get her period - Reviewed options for birth control including oral contraceptive pills (combination and progesterone only), Depo-Provera, Nexplanon, IUDs (copper and levonorgestrol). Thoroughly reviewed risks/benefits/side effects of each. Answered all questions.  Patient with h/o of n/a and opts for CHCs.  - Reviewed that if she is desiring pregnancy, to not delay attempts at childbearing for long as fertility declines significantly at this age, she will consider it and speak  with partner   Routine preventative health maintenance measures emphasized. Please refer to After Visit Summary for other counseling recommendations.   Return in about 6 months (around 12/05/2017) for pap.   Baldemar LenisK. Meryl Dorea Duff, M.D. Attending Obstetrician & Gynecologist, [facility], [facility]

## 2017-10-16 ENCOUNTER — Emergency Department (HOSPITAL_COMMUNITY)
Admission: EM | Admit: 2017-10-16 | Discharge: 2017-10-17 | Disposition: A | Discharge status: Home / Self Care | Payer: Self-pay | Attending: Emergency Medicine | Admitting: Emergency Medicine

## 2017-10-16 ENCOUNTER — Emergency Department (HOSPITAL_COMMUNITY): Payer: Self-pay

## 2017-10-16 ENCOUNTER — Other Ambulatory Visit: Payer: Self-pay

## 2017-10-16 ENCOUNTER — Encounter (HOSPITAL_COMMUNITY): Payer: Self-pay | Admitting: Emergency Medicine

## 2017-10-16 DIAGNOSIS — R0789 Other chest pain: Secondary | ICD-10-CM | POA: Insufficient documentation

## 2017-10-16 DIAGNOSIS — Z79899 Other long term (current) drug therapy: Secondary | ICD-10-CM | POA: Insufficient documentation

## 2017-10-16 NOTE — ED Triage Notes (Signed)
Pt reports chest pain and palpitations x 1 year. Pt reports within the past week the pain has become worse radiating to her back. Pt reports some SHOB and weakness. Pt reports she has been seen previously with no significant findings.

## 2017-10-17 ENCOUNTER — Emergency Department (HOSPITAL_COMMUNITY): Payer: Self-pay

## 2017-10-17 LAB — CBC
HEMATOCRIT: 27.4 % — AB (ref 36.0–46.0)
HEMOGLOBIN: 9.3 g/dL — AB (ref 12.0–15.0)
MCH: 26.6 pg (ref 26.0–34.0)
MCHC: 33.9 g/dL (ref 30.0–36.0)
MCV: 78.5 fL — AB (ref 80.0–100.0)
Platelets: 198 10*3/uL (ref 150–400)
RBC: 3.49 MIL/uL — ABNORMAL LOW (ref 3.87–5.11)
RDW: 16.3 % — AB (ref 11.5–15.5)
WBC: 8 10*3/uL (ref 4.0–10.5)
nRBC: 0 % (ref 0.0–0.2)

## 2017-10-17 LAB — BASIC METABOLIC PANEL
Anion gap: 5 (ref 5–15)
BUN: 10 mg/dL (ref 6–20)
CHLORIDE: 109 mmol/L (ref 98–111)
CO2: 24 mmol/L (ref 22–32)
CREATININE: 0.8 mg/dL (ref 0.44–1.00)
Calcium: 8.7 mg/dL — ABNORMAL LOW (ref 8.9–10.3)
GFR calc Af Amer: >60 mL/min (ref >60)
GFR calc non Af Amer: >60 mL/min (ref >60)
Glucose, Bld: 87 mg/dL (ref 70–99)
Potassium: 3.7 mmol/L (ref 3.5–5.1)
SODIUM: 138 mmol/L (ref 135–145)

## 2017-10-17 LAB — I-STAT BETA HCG BLOOD, ED (MC, WL, AP ONLY): I-stat hCG, quantitative: <5 m[IU]/mL (ref <5)

## 2017-10-17 LAB — I-STAT TROPONIN, ED: TROPONIN I, POC: 0 ng/mL (ref 0.00–0.08)

## 2017-10-17 LAB — D-DIMER, QUANTITATIVE: D-Dimer, Quant: 1.32 ug/mL-FEU — ABNORMAL HIGH (ref 0.00–0.50)

## 2017-10-17 MED ORDER — IOPAMIDOL (ISOVUE-370) INJECTION 76%
100.0000 mL | Freq: Once | INTRAVENOUS | Status: AC | PRN
  Administered 2017-10-17: 100 mL via INTRAVENOUS

## 2017-10-17 MED ORDER — KETOROLAC TROMETHAMINE 30 MG/ML IJ SOLN
30.0000 mg | Freq: Once | INTRAMUSCULAR | Status: AC
  Administered 2017-10-17: 30 mg via INTRAMUSCULAR
  Filled 2017-10-17: qty 1

## 2017-10-17 MED ORDER — NAPROXEN 500 MG PO TABS: 500.0000 mg | ORAL_TABLET | Freq: Two times a day (BID) | ORAL | 0 refills | Status: DC

## 2017-10-17 MED ORDER — IOPAMIDOL (ISOVUE-370) INJECTION 76%
INTRAVENOUS | Status: AC
  Filled 2017-10-17: qty 100

## 2017-10-17 NOTE — ED Provider Notes (Signed)
[facility] EMERGENCY DEPARTMENT Provider Note   CSN: [id number] Arrival date & time: 10/16/17  2319     History   Chief Complaint Chief Complaint  Patient presents with  . Chest Pain    HPI Taylor Roman is a 37 y.o. female.  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  Patient reports 1 year history of intermittent chest pain.  It comes and goes.  She is unable to tell me what makes it better or worse.  She has had worsening pain over the last several days.  She denies any fevers or cough.  Pain is described as pressure and anterior.  It is nonradiating.  Currently her pain is 9 out of 10.  She took ibuprofen with minimal relief.  She denies any history of blood clots or leg swelling.  She is on birth control.  No recent travel or hospitalization.  Past Medical History:  Diagnosis Date  . Anemia    States has sickle cell trait, father of child without sickle cell trait  . Headache    otc med prn  . 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 Vaginal Pap smear, abnormal    10/2015 cervical conization with biopsy.  To have repeat pap in JUne 2018 at [facility]    Patient Active Problem List   Diagnosis Date Noted  . Hemoglobin S-C disease (HCC)   . Anemia   . Vaginal Pap smear, abnormal   . Headache   . 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 CERVICAL CONIZATION W/BX N/A 11/03/2015   Procedure: CONIZATION CERVIX WITH BIOPSY;  Surgeon:  Bing, MD;  Location: [facility];  Service: Gynecology;  Laterality: N/A;  . HYSTEROSCOPY W/D&C N/A 05/10/2017   Procedure: DILATATION AND CURETTAGE /HYSTEROSCOPY;  Surgeon: Conan Bowens, MD;  Location: [facility];  Service: Gynecology;  Laterality: N/A;  . NO PAST  SURGERIES       OB History    Gravida  4   Para  4   Term  4   Preterm  0   AB  0   Living  3     SAB  0   TAB  0   Ectopic  0   Multiple  0   Live Births  4            Home Medications    Prior to Admission medications   Medication Sig Start Date End Date Taking? Authorizing Provider  ibuprofen (ADVIL,MOTRIN) 600 MG tablet Take 1 tablet (600 mg total) by mouth every 6 (six) hours as needed. 05/10/17   Conan Bowens, MD  naproxen (NAPROSYN) 500 MG tablet Take 1 tablet (500 mg total) by mouth 2 (two) times daily. 10/17/17   Penne Rosenstock, Mayer Masker, MD  norgestimate-ethinyl estradiol (ORTHO-CYCLEN,SPRINTEC,PREVIFEM) 0.25-35 MG-MCG tablet Take 1 tablet by mouth daily. 06/05/17   Conan Bowens, MD  oxyCODONE-acetaminophen (PERCOCET/ROXICET) 5-325 MG tablet Take 1-2 tablets by mouth every 6 (six) hours as needed. Patient not taking: Reported on 06/05/2017 05/10/17   Conan Bowens, MD    Family History Family History  Problem Relation Age of Onset  . Cancer Mother        Hepatic carcinoma?  No history of  viral hepatitis she is aware of  . Diabetes Maternal Grandmother   . Anesthesia problems Neg Hx     Social History Social History   Tobacco Use  . Smoking status: Never Smoker  . Smokeless tobacco: Never Used  Substance Use Topics  . Alcohol use: No  . Drug use: No     Allergies   Patient has no known allergies.   Review of Systems Review of Systems  Constitutional: Negative for fever.  Respiratory: Negative for cough and shortness of breath.   Cardiovascular: Positive for chest pain. Negative for leg swelling.  Gastrointestinal: Negative for abdominal pain, nausea and vomiting.  All other systems reviewed and are negative.    Physical Exam Updated Vital Signs BP 123/76   Pulse (!) 57   Temp 98.2 F (36.8 C) (Oral)   Resp 19   SpO2 100%   Physical Exam  Constitutional: She is oriented to person, place, and time. She appears well-developed and  well-nourished.  HENT:  Head: Normocephalic and atraumatic.  Neck: Neck supple.  Cardiovascular: Normal rate, regular rhythm, normal heart sounds and normal pulses.  Pulmonary/Chest: Effort normal. No respiratory distress. She has no wheezes.  Abdominal: Soft. Bowel sounds are normal.  Musculoskeletal:       Right lower leg: She exhibits no tenderness and no edema.       Left lower leg: She exhibits no tenderness and no edema.  Neurological: She is alert and oriented to person, place, and time.  Skin: Skin is warm and dry.  Psychiatric: She has a normal mood and affect.  Nursing note and vitals reviewed.    ED Treatments / Results  Labs (all labs ordered are listed, but only abnormal results are displayed) Labs Reviewed  BASIC METABOLIC PANEL - Abnormal; Notable for the following components:      Result Value   Calcium 8.7 (*)    All other components within normal limits  CBC - Abnormal; Notable for the following components:   RBC 3.49 (*)    Hemoglobin 9.3 (*)    HCT 27.4 (*)    MCV 78.5 (*)    RDW 16.3 (*)    All other components within normal limits  D-DIMER, QUANTITATIVE (NOT AT [facility]) - Abnormal; Notable for the following components:   D-Dimer, Quant 1.32 (*)    All other components within normal limits  I-STAT TROPONIN, ED  I-STAT BETA HCG BLOOD, ED ([facility], [facility], [facility] ONLY)    EKG EKG Interpretation  Date/Time:  Monday October 16 2017 23:23:17 EDT Ventricular Rate:  66 PR Interval:  172 QRS Duration: 70 QT Interval:  406 QTC Calculation: 425 R Axis:   51 Text Interpretation:  Normal sinus rhythm with sinus arrhythmia Normal ECG Confirmed by Ross Marcus ([id number]) on 10/17/2017 3:09:27 AM Also confirmed by Ross Marcus ([id number]), editor Elita Quick ([id number])  on 10/17/2017 7:57:39 AM   Radiology Dg Chest 2 View  Result Date: 10/17/2017 CLINICAL DATA:  Chest pain and short of breath EXAM: CHEST - 2 VIEW COMPARISON:  10/06/2016 FINDINGS: The heart size and  mediastinal contours are within normal limits. Both lungs are clear. The visualized skeletal structures are unremarkable. IMPRESSION: No active cardiopulmonary disease. Electronically Signed   By: Jasmine Pang M.D.   On: 10/17/2017 00:05   Ct Angio Chest Pe W And/or Wo Contrast  Result Date: 10/17/2017 CLINICAL DATA:  Chest pain and palpitations for 1 year. Symptoms have worsened over the past week with onset of shortness of breath  and weakness. EXAM: CT ANGIOGRAPHY CHEST WITH CONTRAST TECHNIQUE: Multidetector CT imaging of the chest was performed using the standard protocol during bolus administration of intravenous contrast. Multiplanar CT image reconstructions and MIPs were obtained to evaluate the vascular anatomy. CONTRAST:  100 mL ISOVUE-370 IOPAMIDOL (ISOVUE-370) INJECTION 76% COMPARISON:  CT chest 05/18/2016. PA and lateral chest earlier today. FINDINGS: Cardiovascular: Satisfactory opacification of the pulmonary arteries to the segmental level. No evidence of pulmonary embolism. Normal heart size. No pericardial effusion. Mediastinum/Nodes: No enlarged mediastinal, hilar, or axillary lymph nodes. Thyroid gland, trachea, and esophagus demonstrate no significant findings. Lungs/Pleura: No pleural effusion. Mild dependent atelectasis noted. Lungs otherwise clear. Upper Abdomen: Negative. Musculoskeletal: No acute or focal abnormality. Review of the MIP images confirms the above findings. IMPRESSION: Negative for pulmonary embolus.  Negative chest CT. Electronically Signed   By: Drusilla Kanner M.D.   On: 10/17/2017 07:49    Procedures Procedures (including critical care time)  Medications Ordered in ED Medications  ketorolac (TORADOL) 30 MG/ML injection 30 mg (30 mg Intramuscular Given 10/17/17 0408)  iopamidol (ISOVUE-370) 76 % injection 100 mL (100 mLs Intravenous Contrast Given 10/17/17 0702)     Initial Impression / Assessment and Plan / ED Course  I have reviewed the triage vital  signs and the nursing notes.  Pertinent labs & imaging results that were available during my care of the patient were reviewed by me and considered in my medical decision making (see chart for details).     Patient presents with chest pain.  She has been seen and evaluated for chest pain in the past.  Somewhat atypical in nature.  Ongoing and waxing and waning.  She is afebrile, nontoxic on exam.  Vital signs reassuring with heart rate of 50s to 60s and SPO2 of 100.  She has some reproducible pain on exam.  Patient was given ketorolac.  EKG is nonischemic.  Chest x-ray shows no evidence of pneumonia or pneumothorax.  She does take birth control.  D-dimer was sent to screen for PE.  She did have a PE study over one year ago that was negative.  D-dimer here is elevated greater than 1.  Repeat study ordered.  CT scan does not show any evidence of PE or other inter-thoracic pathology.  Patient reassured.  Recommend follow-up with primary physician.  After history, exam, and medical workup I feel the patient has been appropriately medically screened and is safe for discharge home. Pertinent diagnoses were discussed with the patient. Patient was given return precautions.   Final Clinical Impressions(s) / ED Diagnoses   Final diagnoses:  Atypical chest pain    ED Discharge Orders         Ordered    naproxen (NAPROSYN) 500 MG tablet  2 times daily     10/17/17 0802           Eryca Bolte, Mayer Masker, MD 10/17/17 2309

## 2018-03-20 ENCOUNTER — Emergency Department (HOSPITAL_COMMUNITY): Payer: Self-pay

## 2018-03-20 ENCOUNTER — Emergency Department (HOSPITAL_COMMUNITY)
Admission: EM | Admit: 2018-03-20 | Discharge: 2018-03-20 | Disposition: A | Discharge status: Home / Self Care | Payer: Self-pay | Attending: Emergency Medicine | Admitting: Emergency Medicine

## 2018-03-20 ENCOUNTER — Ambulatory Visit (HOSPITAL_COMMUNITY)
Admission: EM | Admit: 2018-03-20 | Discharge: 2018-03-20 | Disposition: A | Discharge status: Home / Self Care | Payer: Self-pay

## 2018-03-20 ENCOUNTER — Other Ambulatory Visit: Payer: Self-pay

## 2018-03-20 ENCOUNTER — Encounter (HOSPITAL_COMMUNITY): Payer: Self-pay

## 2018-03-20 DIAGNOSIS — J069 Acute upper respiratory infection, unspecified: Secondary | ICD-10-CM | POA: Insufficient documentation

## 2018-03-20 DIAGNOSIS — Z79899 Other long term (current) drug therapy: Secondary | ICD-10-CM | POA: Insufficient documentation

## 2018-03-20 MED ORDER — IBUPROFEN 800 MG PO TABS
800.0000 mg | ORAL_TABLET | Freq: Once | ORAL | Status: AC
  Administered 2018-03-20: 800 mg via ORAL
  Filled 2018-03-20: qty 1

## 2018-03-20 MED ORDER — ALUM & MAG HYDROXIDE-SIMETH 200-200-20 MG/5ML PO SUSP
30.0000 mL | Freq: Once | ORAL | Status: AC
  Administered 2018-03-20: 30 mL via ORAL
  Filled 2018-03-20: qty 30

## 2018-03-20 MED ORDER — LIDOCAINE VISCOUS HCL 2 % MT SOLN
15.0000 mL | Freq: Once | OROMUCOSAL | Status: AC
  Administered 2018-03-20: 15 mL via ORAL
  Filled 2018-03-20: qty 15

## 2018-03-20 NOTE — Discharge Instructions (Signed)
Your chest x-ray did not show any concerning findings or evidence of infection or pneumonia.  We recommend continued use of Claritin or Zyrtec for symptom management.  Take 600 mg ibuprofen every 6 hours as needed for pain.  You may use over-the-counter medications for continued management of your sore throat.  Follow-up with your primary care doctor to ensure resolution of symptoms.

## 2018-03-20 NOTE — ED Notes (Signed)
Patient transported to X-ray 

## 2018-03-20 NOTE — ED Provider Notes (Signed)
Patient presented to the urgent care center on 03/13/2018 to be seen.  I reviewed the record and saw that she had just been seen in the emergency room on 03/20/2018.  She was diagnosed with a viral illness and offered symptomatic care.  Her chief complaint was that she had been to the ER and she was no better.  I had the nurse explained to her that it was too soon to expect her to be better, there was very little new that we could offer her.  She did inquire whether she had coronavirus.  She was told that she was low risk for coronavirus.  She chose not to be seen.   Eustace Moore, MD 03/20/18 2020

## 2018-03-20 NOTE — ED Notes (Signed)
TXA neb completed. Pt continues to drip blood. Pt states each time she swallows she feels blood come from L nare

## 2018-03-20 NOTE — ED Triage Notes (Signed)
Patient presents to Urgent Care with complaints of headache and lower abdominal pain since yesterday. Patient states she was seen in the ER and is still not feeling well so she came here to be seen.

## 2018-03-20 NOTE — ED Triage Notes (Signed)
Pt reports cough, fever, chills X1 day. No sick contacts, no recent travel.

## 2018-03-20 NOTE — ED Provider Notes (Signed)
[facility] EMERGENCY DEPARTMENT Provider Note   CSN: [id number] Arrival date & time: 03/20/18  0031    History   Chief Complaint Chief Complaint  Patient presents with  . Cough    HPI Taylor Roman is a 38 y.o. female.     38 year old female presents to the emergency department for upper respiratory symptoms.  She reports a pain to the left side of her chest which has been fairly constant since Sunday.  Reports worsening discomfort with breathing.  She developed associated sore throat and subjective fever tonight.  Never took her temperature at home, nor took antipyretics.  Has used Zyrtec for symptoms without relief.  Triage note references cough, though patient denies this.  She denies any sick contacts as well as recent travel.  The history is provided by the patient. No language interpreter was used.    Past Medical History:  Diagnosis Date  . Anemia    States has sickle cell trait, father of child without sickle cell trait  . Headache    otc med prn  . 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 Vaginal Pap smear, abnormal    10/2015 cervical conization with biopsy.  To have repeat pap in JUne 2018 at [facility]    Patient Active Problem List   Diagnosis Date Noted  . Hemoglobin S-C disease (HCC)   . Anemia   . Vaginal Pap smear, abnormal   . Headache   . 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 CERVICAL CONIZATION W/BX N/A 11/03/2015   Procedure: CONIZATION CERVIX WITH BIOPSY;  Surgeon: Brimfield Bing, MD;  Location: [facility];  Service: Gynecology;  Laterality: N/A;  . HYSTEROSCOPY W/D&C N/A 05/10/2017   Procedure: DILATATION AND CURETTAGE /HYSTEROSCOPY;  Surgeon: Conan Bowens, MD;  Location: [facility];  Service: Gynecology;  Laterality: N/A;  . NO  PAST SURGERIES       OB History    Gravida  4   Para  4   Term  4   Preterm  0   AB  0   Living  3     SAB  0   TAB  0   Ectopic  0   Multiple  0   Live Births  4            Home Medications    Prior to Admission medications   Medication Sig Start Date End Date Taking? Authorizing Provider  ibuprofen (ADVIL,MOTRIN) 600 MG tablet Take 1 tablet (600 mg total) by mouth every 6 (six) hours as needed. 05/10/17   Conan Bowens, MD  naproxen (NAPROSYN) 500 MG tablet Take 1 tablet (500 mg total) by mouth 2 (two) times daily. 10/17/17   Horton, Mayer Masker, MD  norgestimate-ethinyl estradiol (ORTHO-CYCLEN,SPRINTEC,PREVIFEM) 0.25-35 MG-MCG tablet Take 1 tablet by mouth daily. 06/05/17   Conan Bowens, MD  oxyCODONE-acetaminophen (PERCOCET/ROXICET) 5-325 MG tablet Take 1-2 tablets by mouth every 6 (six) hours as needed. Patient not taking: Reported on 06/05/2017 05/10/17   Conan Bowens, MD    Family History Family History  Problem Relation Age of Onset  . Cancer Mother        Hepatic carcinoma?  No history of viral hepatitis she is aware of  . Diabetes Maternal Grandmother   . Anesthesia  problems Neg Hx     Social History Social History   Tobacco Use  . Smoking status: Never Smoker  . Smokeless tobacco: Never Used  Substance Use Topics  . Alcohol use: No  . Drug use: No     Allergies   Patient has no known allergies.   Review of Systems Review of Systems  Ten systems reviewed and are negative for acute change, except as noted in the HPI.    Physical Exam Updated Vital Signs BP (!) 150/94 (BP Location: Right Arm)   Pulse 81   Temp 97.9 F (36.6 C) (Oral)   Resp 16   Ht 5\' 7"  (1.702 m)   Wt 90.7 kg   SpO2 100%   BMI 31.32 kg/m   Physical Exam Vitals signs and nursing note reviewed.  Constitutional:      General: She is not in acute distress.    Appearance: She is well-developed. She is not diaphoretic.     Comments: Nontoxic appearing and in  nAD  HENT:     Head: Normocephalic and atraumatic.     Right Ear: External ear normal.     Left Ear: External ear normal.     Mouth/Throat:     Comments: Mild posterior oropharyngeal edema.  Uvula midline.  No tonsillar exudates or significant erythema.  Normal phonation.  Tolerating secretions without difficulty. Eyes:     General: No scleral icterus.    Conjunctiva/sclera: Conjunctivae normal.  Neck:     Musculoskeletal: Normal range of motion.  Cardiovascular:     Rate and Rhythm: Normal rate and regular rhythm.     Pulses: Normal pulses.  Pulmonary:     Effort: Pulmonary effort is normal. No respiratory distress.     Breath sounds: No stridor. No wheezing, rhonchi or rales.     Comments: Lungs CTAB. Respirations even and unlabored. Musculoskeletal: Normal range of motion.  Skin:    General: Skin is warm and dry.     Coloration: Skin is not pale.     Findings: No erythema or rash.  Neurological:     Mental Status: She is alert and oriented to person, place, and time.  Psychiatric:        Behavior: Behavior normal.      ED Treatments / Results  Labs (all labs ordered are listed, but only abnormal results are displayed) Labs Reviewed - No data to display  EKG None  Radiology Dg Chest 2 View  Result Date: 03/20/2018 CLINICAL DATA:  Pt reports chest pain, fever, chills X1 day. No sick contacts, no recent travel. EXAM: CHEST - 2 VIEW COMPARISON:  Chest CT, 10/17/2017.  Chest radiographs, 10/16/2017. FINDINGS: The heart size and mediastinal contours are within normal limits. Both lungs are clear. No pleural effusion or pneumothorax. The visualized skeletal structures are unremarkable. IMPRESSION: Normal chest radiographs. Electronically Signed   By: Amie Portlandavid  Ormond M.D.   On: 03/20/2018 01:47    Procedures Procedures (including critical care time)  Medications Ordered in ED Medications  ibuprofen (ADVIL,MOTRIN) tablet 800 mg (800 mg Oral Given 03/20/18 0200)  alum & mag  hydroxide-simeth (MAALOX/MYLANTA) 200-200-20 MG/5ML suspension 30 mL (30 mLs Oral Given 03/20/18 0200)    And  lidocaine (XYLOCAINE) 2 % viscous mouth solution 15 mL (15 mLs Oral Given 03/20/18 0200)     Initial Impression / Assessment and Plan / ED Course  I have reviewed the triage vital signs and the nursing notes.  Pertinent labs & imaging results that were available  during my care of the patient were reviewed by me and considered in my medical decision making (see chart for details).        Patient's CXR negative for acute infiltrate. Patient's symptoms are consistent with URI, likely viral etiology. Discussed that antibiotics are not indicated for viral infections. Patient will be discharged with symptomatic treatment.  She verbalizes understanding and is agreeable with plan.  Patient is hemodynamically stable and in NAD prior to discharge.   Final Clinical Impressions(s) / ED Diagnoses   Final diagnoses:  Upper respiratory tract infection, unspecified type    ED Discharge Orders    None       Antony Madura, PA-C 03/20/18 0249    Nira Conn, MD 03/20/18 [phone]

## 2018-03-20 NOTE — ED Notes (Signed)
Dr Delton See spoke to this nurse about patient. Patient has been seen and treated at the emergency department today.  Patient complained of feeling no better.  Reviewed discharge instructions with patient .  Patient asked about Corona Virus.  Reviewed risk factors and assured patient that she did not meet criteria for testing.  Assured patient she would not feel any better at this time, takes time to get better.  Patient agreeable.  And her only question was "is it Corona Virus".  Again, assured patient this is not the case.  No travel, no exposures

## 2018-08-21 ENCOUNTER — Ambulatory Visit: Payer: Self-pay | Admitting: Internal Medicine

## 2018-08-21 ENCOUNTER — Other Ambulatory Visit: Payer: Self-pay

## 2018-08-21 ENCOUNTER — Encounter: Payer: Self-pay | Admitting: Internal Medicine

## 2018-08-21 VITALS — BP 110/64 | HR 58 | Temp 98.2°F | Resp 12 | Ht 67.0 in | Wt 221.5 lb

## 2018-08-21 DIAGNOSIS — M7702 Medial epicondylitis, left elbow: Secondary | ICD-10-CM

## 2018-08-21 MED ORDER — NAPROXEN 500 MG PO TABS: 500.0000 mg | ORAL_TABLET | Freq: Two times a day (BID) | ORAL | 1 refills | Status: DC

## 2018-08-21 MED ORDER — PREDNISONE 10 MG PO TABS: ORAL_TABLET | ORAL | 0 refills | Status: DC

## 2018-08-21 NOTE — Progress Notes (Signed)
    Subjective:    Patient ID: Taylor Roman, female   DOB: [birth year], 37 y.o.   MRN: [id number]   HPI   Here to reestablish  1.  Left arm pain:  Started 5 months or more ago.  Cannot recall any injury preceding or repetitive arm movement.  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   The pain is at her elbow and points to mainly the medial epicondylar area.   Sometimes radiates up to shoulder/neck area.   No numbness or tingling in her arm. Sleeps on her right side as her left arm hurts.   She is right handed.   She has taken ibuprofen 800 mg and does not help.   Current Meds  Medication Sig  . ibuprofen (ADVIL,MOTRIN) 600 MG tablet Take 1 tablet (600 mg total) by mouth every 6 (six) hours as needed.   No Known Allergies   Review of Systems    Objective:   BP 110/64 (BP Location: Right Arm, Patient Position: Sitting, Cuff Size: Normal)   Pulse (!) 58   Temp 98.2 F (36.8 C)   Resp 12   Ht 5\' 7"  (1.702 m)   Wt 221 lb 8 oz (100.5 kg)   LMP 08/17/2018 (Exact Date)   BMI 34.69 kg/m   Physical Exam  NAD Lungs:  CTA CV:  RRR without murmur or rub.   Left elbow/arm with full ROM.  No swelling or adenopathy.  Tender over medial epicondyle and tendinous insertion from forearm    Assessment & Plan   1.  Left medial epicondylitis:  Discussed how to use the arm in different ways to avoid pain.   Prednisone 30 mg daily for 5 days. Air cast arm splint during the day.   Naproxen 500 mg twice daily as needed with meals when completes prednisone. PT referral.

## 2018-11-21 ENCOUNTER — Encounter: Payer: Self-pay | Admitting: Internal Medicine

## 2018-11-21 ENCOUNTER — Other Ambulatory Visit: Payer: Self-pay

## 2018-11-21 ENCOUNTER — Ambulatory Visit (INDEPENDENT_AMBULATORY_CARE_PROVIDER_SITE_OTHER): Payer: Self-pay | Admitting: Internal Medicine

## 2018-11-21 VITALS — BP 130/80 | HR 72 | Resp 12 | Ht 67.0 in | Wt 226.0 lb

## 2018-11-21 DIAGNOSIS — M7702 Medial epicondylitis, left elbow: Secondary | ICD-10-CM

## 2018-11-21 DIAGNOSIS — Z23 Encounter for immunization: Secondary | ICD-10-CM

## 2018-11-21 MED ORDER — PREDNISONE 20 MG PO TABS: 20.0000 mg | ORAL_TABLET | Freq: Every day | ORAL | 0 refills | Status: DC

## 2018-11-21 NOTE — Progress Notes (Signed)
    Subjective:    Patient ID: Taylor Roman, female   DOB: [birth year], 38 y.o.   MRN: [id number]   HPI   Left Medial Epicondylitis:  Going to [facility] now for 1 month--goes weekly.  Getting massage and exercises.  After PT visit, hurts a bit more. Did not use the arm splint as it hurt more. She was apparently placing the air bladder directly over the medial epicondyle.  Did not bring the splint with her today. States the prednisone really decreased the pain.   The Naproxen helps, but wakes her again in the evening.  Current Meds  Medication Sig  . ibuprofen (ADVIL,MOTRIN) 600 MG tablet Take 1 tablet (600 mg total) by mouth every 6 (six) hours as needed.  . naproxen (NAPROSYN) 500 MG tablet Take 1 tablet (500 mg total) by mouth 2 (two) times daily with a meal.   No Known Allergies   Review of Systems    Objective:   BP 130/80 (BP Location: Left Arm, Patient Position: Sitting, Cuff Size: Normal)   Pulse 72   Resp 12   Ht 5\' 7"  (1.702 m)   Wt 226 lb (102.5 kg)   LMP 11/12/2018   BMI 35.40 kg/m   Physical Exam  Still tender over left medial epicondyle, though less intense. Pain on supination against an opposing force.  Assessment & Plan   Left Medical epicondylitis:  Prednisone 20 mg daily for 5 days.  Showed her how to place the arm splint appropriately. Instructed to wear during the day and take off at bedtime.  HM:  Influenza vaccination

## 2018-12-01 DIAGNOSIS — M7702 Medial epicondylitis, left elbow: Secondary | ICD-10-CM | POA: Insufficient documentation

## 2018-12-05 ENCOUNTER — Emergency Department (HOSPITAL_COMMUNITY): Payer: Self-pay

## 2018-12-05 ENCOUNTER — Other Ambulatory Visit: Payer: Self-pay

## 2018-12-05 ENCOUNTER — Emergency Department (HOSPITAL_COMMUNITY)
Admission: EM | Admit: 2018-12-05 | Discharge: 2018-12-06 | Disposition: A | Discharge status: Home / Self Care | Payer: Self-pay | Attending: Emergency Medicine | Admitting: Emergency Medicine

## 2018-12-05 ENCOUNTER — Encounter (HOSPITAL_COMMUNITY): Payer: Self-pay | Admitting: Emergency Medicine

## 2018-12-05 DIAGNOSIS — S0302XA Dislocation of jaw, left side, initial encounter: Secondary | ICD-10-CM | POA: Insufficient documentation

## 2018-12-05 DIAGNOSIS — Z79899 Other long term (current) drug therapy: Secondary | ICD-10-CM | POA: Insufficient documentation

## 2018-12-05 DIAGNOSIS — S0300XA Dislocation of jaw, unspecified side, initial encounter: Secondary | ICD-10-CM

## 2018-12-05 DIAGNOSIS — Y999 Unspecified external cause status: Secondary | ICD-10-CM | POA: Insufficient documentation

## 2018-12-05 DIAGNOSIS — X58XXXA Exposure to other specified factors, initial encounter: Secondary | ICD-10-CM | POA: Insufficient documentation

## 2018-12-05 DIAGNOSIS — Y929 Unspecified place or not applicable: Secondary | ICD-10-CM | POA: Insufficient documentation

## 2018-12-05 DIAGNOSIS — Y9389 Activity, other specified: Secondary | ICD-10-CM | POA: Insufficient documentation

## 2018-12-05 MED ORDER — PROPOFOL 10 MG/ML IV BOLUS
0.5000 mg/kg | Freq: Once | INTRAVENOUS | Status: AC
  Administered 2018-12-05: 23:00:00 100 mg via INTRAVENOUS

## 2018-12-05 MED ORDER — FENTANYL CITRATE (PF) 100 MCG/2ML IJ SOLN
150.0000 ug | Freq: Once | INTRAMUSCULAR | Status: AC
  Administered 2018-12-05: 150 ug via INTRAMUSCULAR
  Filled 2018-12-05: qty 4

## 2018-12-05 MED ORDER — METHOCARBAMOL 500 MG PO TABS: 500.0000 mg | ORAL_TABLET | Freq: Two times a day (BID) | ORAL | 0 refills | Status: DC

## 2018-12-05 MED ORDER — PROPOFOL 10 MG/ML IV BOLUS
INTRAVENOUS | Status: AC
  Administered 2018-12-05: 100 mg via INTRAVENOUS
  Filled 2018-12-05: qty 20

## 2018-12-05 MED ORDER — MORPHINE SULFATE (PF) 4 MG/ML IV SOLN
4.0000 mg | Freq: Once | INTRAVENOUS | Status: AC
  Administered 2018-12-05: 4 mg via INTRAMUSCULAR
  Filled 2018-12-05: qty 1

## 2018-12-05 MED ORDER — IBUPROFEN 200 MG PO TABS
600.0000 mg | ORAL_TABLET | Freq: Once | ORAL | Status: AC
  Administered 2018-12-05: 600 mg via ORAL
  Filled 2018-12-05: qty 3

## 2018-12-05 NOTE — ED Provider Notes (Signed)
.  Sedation  Date/Time: 12/05/2018 11:42 PM Performed by: Deno Etienne, DO Authorized by: Deno Etienne, DO   Consent:    Consent obtained:  Verbal   Consent given by:  Patient   Risks discussed:  Allergic reaction, dysrhythmia, inadequate sedation, nausea, vomiting, respiratory compromise necessitating ventilatory assistance and intubation, prolonged sedation necessitating reversal and prolonged hypoxia resulting in organ damage Universal protocol:    Immediately prior to procedure a time out was called: yes     Patient identity confirmation method:  Arm band and verbally with patient Indications:    Procedure performed:  Dislocation reduction   Procedure necessitating sedation performed by:  Different physician Pre-sedation assessment:    Time since last food or drink:  4   ASA classification: class 1 - normal, healthy patient     Neck mobility: normal     Mouth opening:  3 or more finger widths   Thyromental distance:  4 finger widths   Mallampati score:  II - soft palate, uvula, fauces visible   Pre-sedation assessments completed and reviewed: airway patency, cardiovascular function, hydration status, mental status, nausea/vomiting, pain level, respiratory function and temperature   Immediate pre-procedure details:    Reassessment: Patient reassessed immediately prior to procedure     Reviewed: vital signs, relevant labs/tests and NPO status     Verified: bag valve mask available, emergency equipment available, intubation equipment available, IV patency confirmed, oxygen available and suction available   Procedure details (see MAR for exact dosages):    Preoxygenation:  Nasal cannula   Sedation:  Propofol   Intended level of sedation: moderate (conscious sedation)   Analgesia:  Fentanyl   Intra-procedure monitoring:  Blood pressure monitoring, cardiac monitor, continuous capnometry, frequent LOC assessments, frequent vital sign checks and continuous pulse oximetry   Intra-procedure  events: respiratory depression     Intra-procedure management: painful stimuli.   Total Provider sedation time (minutes):  35 Post-procedure details:    Attendance: Constant attendance by certified staff until patient recovered     Recovery: Patient returned to pre-procedure baseline     Post-sedation assessments completed and reviewed: airway patency, cardiovascular function, hydration status, mental status, nausea/vomiting, pain level, respiratory function and temperature     Patient is stable for discharge or admission: yes     Patient tolerance:  Tolerated well, no immediate complications      Deno Etienne, DO 12/05/18 2344

## 2018-12-05 NOTE — ED Notes (Signed)
2319 Time out 2321 100mg  Propofol 2323 jaw reset

## 2018-12-05 NOTE — ED Provider Notes (Signed)
[facility] DEPT Provider Note   CSN: [id number] Arrival date & time: 12/05/18  1729     History   Chief Complaint Chief Complaint  Patient presents with   Jaw Pain    HPI Taylor Roman is a 38 y.o. female with PMHx anemia and sickle cell trait who presents to the ED today for sudden onset, constant, achy, left jaw pain that occurred earlier today around 5 PM.  Patient states that she was ER running when she felt immediate pain in her left jaw and now feels like it is stuck.  She states that she feels like she is unable to fully close her jaw on the left side.  Has not taken anything for the pain.  No other symptoms at this time bleeding vision changes, headache.        Past Medical History:  Diagnosis Date   Anemia    States has sickle cell trait, father of child without sickle cell trait   Headache    otc med prn   Hemoglobin S-C disease ([place]) 2013, 2017   Trait for both   Vaginal Pap smear, abnormal    10/2015 cervical conization with biopsy.  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   Sickle cell-hemoglobin C disease ([place]) 07/13/2011    Past Surgical History:  Procedure Laterality Date   CERVICAL CONIZATION W/BX N/A 11/03/2015   Procedure: CONIZATION CERVIX WITH BIOPSY;  Surgeon: Aletha Halim, MD;  Location: [facility];  Service: Gynecology;  Laterality: N/A;   HYSTEROSCOPY W/D&C N/A 05/10/2017   Procedure: DILATATION AND CURETTAGE /HYSTEROSCOPY;  Surgeon: Sloan Leiter, MD;  Location: [facility];  Service: Gynecology;  Laterality: N/A;   NO PAST SURGERIES       OB History    Gravida  4   Para  4   Term  4   Preterm  0   AB  0   Living  3     SAB  0   TAB  0   Ectopic  0   Multiple  0   Live Births  4            Home Medications    Prior to Admission medications   Medication Sig Start Date End Date Taking? Authorizing Provider  ibuprofen (ADVIL,MOTRIN) 600 MG tablet Take 1 tablet (600 mg total) by mouth every 6 (six) hours as needed. 05/10/17   Sloan Leiter, MD  methocarbamol (ROBAXIN) 500 MG tablet Take 1 tablet (500 mg total) by mouth 2 (two) times daily. 12/05/18   Eustaquio Maize, PA-C  naproxen (NAPROSYN) 500 MG tablet Take 1 tablet (500 mg total) by mouth 2 (two) times daily with a meal. 08/21/18   Mack Hook, MD  predniSONE (DELTASONE) 20 MG tablet Take 1 tablet (20 mg total) by mouth daily with breakfast. 11/21/18   Mack Hook, MD    Family History Family History  Problem Relation Age of Onset   Cancer Mother        Hepatic carcinoma?  No history of viral hepatitis she is aware of   Diabetes Maternal Grandmother    Anesthesia  problems Neg Hx     Social History Social History   Tobacco Use   Smoking status: Never Smoker   Smokeless tobacco: Never Used  Substance Use Topics   Alcohol use: No   Drug use: No     Allergies   Patient has no known allergies.   Review of Systems Review of Systems  Constitutional: Negative for chills and fever.  HENT: Negative for ear pain.        + jaw pain  Eyes: Negative for visual disturbance.  Neurological: Negative for headaches.     Physical Exam Updated Vital Signs BP (!) 144/84    Pulse 64    Temp 98.4 F (36.9 C) (Oral)    Resp 18    LMP 11/12/2018    SpO2 100%   Physical Exam Vitals signs and nursing note reviewed.  Constitutional:      Appearance: She is not ill-appearing.  HENT:     Head:     Comments: Limited ROM of mandible due to pain. TTP to left TMJ joint. Pt with 2 finger trismus with opening of the jaw and about a 1 cm gap between teeth with closure of jaw.     Right  Ear: Tympanic membrane normal.     Left Ear: Tympanic membrane normal.  Eyes:     Conjunctiva/sclera: Conjunctivae normal.  Cardiovascular:     Rate and Rhythm: Normal rate and regular rhythm.     Pulses: Normal pulses.  Pulmonary:     Effort: Pulmonary effort is normal.     Breath sounds: Normal breath sounds. No wheezing, rhonchi or rales.  Skin:    General: Skin is warm and dry.     Coloration: Skin is not jaundiced.  Neurological:     Mental Status: She is alert.      ED Treatments / Results  Labs (all labs ordered are listed, but only abnormal results are displayed) Labs Reviewed - No data to display  EKG None  Radiology Ct Maxillofacial Wo Cm  Result Date: 12/05/2018 CLINICAL DATA:  Acute left temporomandibular joint pain with limited range of motion. EXAM: CT MAXILLOFACIAL WITHOUT CONTRAST TECHNIQUE: Multidetector CT imaging of the maxillofacial structures was performed. Multiplanar CT image reconstructions were also generated. COMPARISON:  None. FINDINGS: Osseous: There is an abnormal configuration of the articular fossa and eminence of the left temporomandibular joint. The articular fossa is abnormally deep with a very thin roof on the articular fossa particularly as compared to the right articular fossa. The articular disc is not well-defined on the left. During this exam the left mandibular condyle sits at the apex of the eminence not fully seated in the fossa. The right mandibular condyle is seated in the fossa. The right articular disc appears to be properly located. 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 There are no fractures. . Orbits: Negative. No traumatic or inflammatory finding. Sinuses: Slight mucosal thickening in the base of the right maxillary sinus. 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 The sinuses are otherwise normal. Soft tissues: Negative. Limited intracranial: No  significant or unexpected finding. IMPRESSION: 1. Abnormal configuration of the articular fossa and eminence of the left temporomandibular joint with a very thin roof on the articular fossa particularly as compared to the right articular fossa. 2. The left mandibular condyle sits at the apex of the eminence not fully seated in the fossa of  the left temporomandibular joint. The right mandibular condyle is properly seated. 3. 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 4. Slight mucosal thickening in the base of the right maxillary sinus. Electronically Signed   By: Francene BoyersJames  Maxwell M.D.   On: 12/05/2018 20:37    Procedures Procedures (including critical care time)  Medications Ordered in ED Medications  ibuprofen (ADVIL) tablet 600 mg (600 mg Oral Given 12/05/18 2017)  morphine 4 MG/ML injection 4 mg (4 mg Intramuscular Given 12/05/18 2147)  fentaNYL (SUBLIMAZE) injection 150 mcg (150 mcg Intramuscular Given 12/05/18 2226)  propofol (DIPRIVAN) 10 mg/mL bolus/IV push 0.5 mg/kg (100 mg Intravenous Given 12/05/18 2321)     Initial Impression / Assessment and Plan / ED Course  I have reviewed the triage vital signs and the nursing notes.  Pertinent labs & imaging results that were available during my care of the patient were reviewed by me and considered in my medical decision making (see chart for details).    38 year old female presents the ED today complaining of sudden onset left jaw pain after yawning.  Feels like she cannot close her jaw all the way.  On exam patient is tender to palpation to the left TMJ.  She does have 2 finger trismus with opening of jaw and about a 1 cm gap between her upper and lower molars on the left side with closure of jaw.  Unfortunately we are unable to get [employer] imaging at this facility.  Will obtain CT maxillofacial to assess for limitation in TMJ movement. Discussed case with attending physician Dr. Adela LankFloyd who agrees with plan.    CT scan with condyle not fully sitting in the fossa on left side; have attempted manual reduction by myself with multiple maneuvers unsuccessfully. Dr. Adela LankFloyd attending physician attempted reduction as well at bedside unsuccessfully. Conscious sedation initiated with successful relocation.   Prescribed Robaxin for patient to help with pain.  Advised to take ibuprofen and Tylenol as well PRN. have advised soft food diet for the next several days.  Have advised follow-up with PCP.  Patient is in agreement with plan at this time is stable for discharge home  This note was prepared using Dragon voice recognition software and may include unintentional dictation errors due to the inherent limitations of voice recognition software.       Final Clinical Impressions(s) / ED Diagnoses   Final diagnoses:  Dislocation of temporomandibular joint, initial encounter    ED Discharge Orders         Ordered    methocarbamol (ROBAXIN) 500 MG tablet  2 times daily     12/05/18 2348           Tanda RockersVenter, Purnell Daigle, PA-C 12/05/18 2351    Melene PlanFloyd, Dan, DO 12/10/18 0700

## 2018-12-05 NOTE — ED Triage Notes (Signed)
Pt reports about an hour ago was yawning and has pains on left jaw, feels like it stuck and wont close on left side.

## 2018-12-05 NOTE — Discharge Instructions (Signed)
We have successfully relocated your jaw. Please take medication as prescribed to help relax your muscles. DO NOT DRIVE while on this medication. Eat soft foods for the next few days. Attached is a diet plan that you can follow; this will help put less strain on your jaw as well.   Please follow up with your PCP regarding your ED visit today.

## 2018-12-21 ENCOUNTER — Emergency Department (HOSPITAL_COMMUNITY)
Admission: EM | Admit: 2018-12-21 | Discharge: 2018-12-21 | Disposition: A | Discharge status: Home / Self Care | Payer: Self-pay | Attending: Emergency Medicine | Admitting: Emergency Medicine

## 2018-12-21 ENCOUNTER — Other Ambulatory Visit: Payer: Self-pay

## 2018-12-21 ENCOUNTER — Encounter (HOSPITAL_COMMUNITY): Payer: Self-pay | Admitting: Emergency Medicine

## 2018-12-21 DIAGNOSIS — Y929 Unspecified place or not applicable: Secondary | ICD-10-CM | POA: Insufficient documentation

## 2018-12-21 DIAGNOSIS — Z791 Long term (current) use of non-steroidal anti-inflammatories (NSAID): Secondary | ICD-10-CM | POA: Insufficient documentation

## 2018-12-21 DIAGNOSIS — Y999 Unspecified external cause status: Secondary | ICD-10-CM | POA: Insufficient documentation

## 2018-12-21 DIAGNOSIS — S0302XA Dislocation of jaw, left side, initial encounter: Secondary | ICD-10-CM | POA: Insufficient documentation

## 2018-12-21 DIAGNOSIS — S0300XA Dislocation of jaw, unspecified side, initial encounter: Secondary | ICD-10-CM

## 2018-12-21 DIAGNOSIS — Z79899 Other long term (current) drug therapy: Secondary | ICD-10-CM | POA: Insufficient documentation

## 2018-12-21 DIAGNOSIS — X500XXA Overexertion from strenuous movement or load, initial encounter: Secondary | ICD-10-CM | POA: Insufficient documentation

## 2018-12-21 DIAGNOSIS — Y939 Activity, unspecified: Secondary | ICD-10-CM | POA: Insufficient documentation

## 2018-12-21 MED ORDER — FENTANYL CITRATE (PF) 100 MCG/2ML IJ SOLN
50.0000 ug | Freq: Once | INTRAMUSCULAR | Status: AC
  Administered 2018-12-21: 09:00:00 50 ug via INTRAVENOUS
  Filled 2018-12-21: qty 2

## 2018-12-21 MED ORDER — NAPROXEN 500 MG PO TABS: 500.0000 mg | ORAL_TABLET | Freq: Two times a day (BID) | ORAL | 0 refills | Status: DC

## 2018-12-21 MED ORDER — METHOCARBAMOL 500 MG PO TABS: 500.0000 mg | ORAL_TABLET | Freq: Two times a day (BID) | ORAL | 0 refills | Status: DC

## 2018-12-21 MED ORDER — PROPOFOL 10 MG/ML IV BOLUS
0.5000 mg/kg | Freq: Once | INTRAVENOUS | Status: AC
  Administered 2018-12-21: 51.5 mg via INTRAVENOUS
  Filled 2018-12-21: qty 20

## 2018-12-21 NOTE — ED Provider Notes (Signed)
[facility]-EMERGENCY DEPT Provider Note   CSN: [id number] Arrival date & time: 12/21/18  [id number]     History No chief complaint on file.   Taylor Ravelingmina Keator is a 38 y.o. female who presents to the ED today complaining of sudden onset, constant, achy, left jaw pain that began this morning approximately 2 hours ago.  Patient reports that she was yawning this morning when she felt immediate pain to her jaw and now feels like it is out of place.  She was recently seen in the ED on 12/02 for same and of having a dislocation of her jaw which was manually reduced with conscious sedation after several attempts while patient was awake.  Patient reports that was the first time.  She had been doing well at home until this morning.  She has not taken anything for pain prior to arrival.  She has no other complaints at this time.  The history is provided by the patient.       Past Medical History:  Diagnosis Date  . Anemia    States has sickle cell trait, father of child without sickle cell trait  . Headache    otc med prn  . 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 Vaginal Pap smear, abnormal    10/2015 cervical conization with biopsy.  [facility]Une 2018 [facility]    Patient Active Problem List   Diagnosis Date Noted  . Medial epicondylitis of left elbow 12/01/2018  . Hemoglobin S-C disease (HCC)   . Anemia   . Vaginal Pap smear, abnormal   . Headache   . 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 CERVICAL CONIZATION W/BX N/A 11/03/2015   Procedure: CONIZATION CERVIX WITH BIOPSY;  Surgeon: [place] Bingharlie Pickens, MD;  Location: [facility];  Service: Gynecology;  Laterality: N/A;  . HYSTEROSCOPY WITH D & C N/A 05/10/2017   Procedure: DILATATION AND CURETTAGE /HYSTEROSCOPY;  Surgeon:  Conan Bowensavis, Kelly M, MD;  Location:  S[facility];  Service: Gynecology;  Laterality: N/A;  . NO PAST SURGERIES       OB History    Gravida  4   Para  4   Term  4   Preterm  0   AB  0   Living  3     SAB  0   TAB  0   Ectopic  0   Multiple  0   Live Births  4           Family History  Problem Relation Age of Onset  . Cancer Mother        Hepatic carcinoma?  No history of viral hepatitis she is aware of  . Diabetes Maternal Grandmother   . Anesthesia problems Neg Hx     Social History   Tobacco Use  . Smoking status: Never Smoker  . Smokeless tobacco: Never Used  Substance Use Topics  . Alcohol use: No  . Drug use: No    Home Medications Prior to Admission medications   Medication Sig Start Date End Date Taking? Authorizing Provider  ibuprofen (ADVIL,MOTRIN) 600 MG tablet Take 1 tablet (600 mg total) by mouth every 6 (six) hours as needed. 05/10/17   Conan Bowensavis, Kelly M, MD  methocarbamol (ROBAXIN) 500 MG tablet Take 1 tablet (  500 mg total) by mouth 2 (two) times daily. 12/05/18   Alroy Bailiff, Jerry Haugen, PA-C  methocarbamol (ROBAXIN) 500 MG tablet Take 1 tablet (500 mg total) by mouth 2 (two) times daily. 12/21/18   Alroy Bailiff, Saddie Sandeen, PA-C  naproxen (NAPROSYN) 500 MG tablet Take 1 tablet (500 mg total) by mouth 2 (two) times daily with a meal. 08/21/18   Mack Hook, MD  naproxen (NAPROSYN) 500 MG tablet Take 1 tablet (500 mg total) by mouth 2 (two) times daily. 12/21/18   Eustaquio Maize, PA-C  predniSONE (DELTASONE) 20 MG tablet Take 1 tablet (20 mg total) by mouth daily with breakfast. 11/21/18   Mack Hook, MD    Allergies    Patient has no known allergies.  Review of Systems   Review of Systems  Constitutional: Negative for chills and fever.  HENT: Negative for ear pain.        + jaw pain    Physical Exam Updated Vital Signs BP 130/89 (BP Location: Left Arm)   Pulse 69   Temp 98.5 F (36.9 C) (Oral)   Resp 18   LMP 12/09/2018    SpO2 100%   Physical Exam Vitals and nursing note reviewed.  Constitutional:      Appearance: She is not ill-appearing.  HENT:     Head: Normocephalic and atraumatic.     Comments: No obvious deformity felt to TMJ joint to left side but with obvious TTP. Pt able to open mouth approximately 2 finger width; upon closing mouth she has about a 1 cm gap between her upper and lower teeth; mandible does appear to be protruding outward     Right Ear: Tympanic membrane normal.     Left Ear: Tympanic membrane normal.  Eyes:     Conjunctiva/sclera: Conjunctivae normal.  Cardiovascular:     Rate and Rhythm: Normal rate and regular rhythm.  Pulmonary:     Effort: Pulmonary effort is normal.     Breath sounds: Normal breath sounds.  Abdominal:     Palpations: Abdomen is soft.     Tenderness: There is no abdominal tenderness.  Musculoskeletal:     Cervical back: Neck supple.  Skin:    General: Skin is warm and dry.  Neurological:     Mental Status: She is alert.     ED Results / Procedures / Treatments   Labs (all labs ordered are listed, but only abnormal results are displayed) Labs Reviewed - No data to display  EKG None  Radiology No results found.  Procedures Reduction of dislocation  Date/Time: 12/21/2018 9:57 AM Performed by: Eustaquio Maize, PA-C Authorized by: Eustaquio Maize, PA-C  Consent: Verbal consent obtained. Written consent obtained. Risks and benefits: risks, benefits and alternatives were discussed Consent given by: patient Patient identity confirmed: verbally with patient Time out: Immediately prior to procedure a "time out" was called to verify the correct patient, procedure, equipment, support staff and site/side marked as required.  Sedation: Patient sedated: yes Sedation type: moderate (conscious) sedation Sedatives: propofol Sedation start date/time: 12/21/2018 9:34 AM Sedation end date/time: 12/21/2018 9:57 AM Vitals: Vital signs were monitored  during sedation.  Patient tolerance: patient tolerated the procedure well with no immediate complications    (including critical care time)  Medications Ordered in ED Medications  fentaNYL (SUBLIMAZE) injection 50 mcg (50 mcg Intravenous Given 12/21/18 0849)  propofol (DIPRIVAN) 10 mg/mL bolus/IV push 51.5 mg (51.5 mg Intravenous Given 12/21/18 0943)    ED Course  I have reviewed the triage vital signs and  the nursing notes.  Pertinent labs & imaging results that were available during my care of the patient were reviewed by me and considered in my medical decision making (see chart for details).  38 year old female who presents to the ED today for sudden pain to left jaw after yawning this morning. Recently seen in the ED on 12/02 by myself for similar complaint and found to have jaw dislocation which required conscious sedation after several unsuccessful attempts without sedation. Jaw dislocation was confirmed on CT scan at that time given we do not have panorex machine here at [facility]. Given similar presentation do not feel pt needs additional imaging at this time. Will attempt manual reduction first prior to conscious sedation although suspect patient may not tolerate it well given last ED visit. She is in agreement with plan at this time. Fentanyl given for pain.   Attempted to manually reduce without success. Unfortunately patient does not tolerate much manipulation without significant pain. Will consciously sedate. Dr. Jeraldine Loots attending physician has seen patient as well and agrees with plan.   Reduction successful after conscious sedation with 140 mg of Propofol. See Dr. Priscille Loveless conscious sedation note. Pt to be discharged home at this time with muscle relaxers and soft food diet.   This note was prepared using Dragon voice recognition software and may include unintentional dictation errors due to the inherent limitations of voice recognition software.     MDM  Rules/Calculators/A&P                       Final Clinical Impression(s) / ED Diagnoses Final diagnoses:  Jaw dislocation, initial encounter    Rx / DC Orders ED Discharge Orders         Ordered    methocarbamol (ROBAXIN) 500 MG tablet  2 times daily     12/21/18 1017    naproxen (NAPROSYN) 500 MG tablet  2 times daily     12/21/18 1017           Discharge Instructions     Please take medications as prescribed to help with jaw pain. Attached is a soft foot eating plan; follow this to allow your jaw to rest as hard foods can cause a lot of pain after having your jaw put back into place.   Follow up with your PCP regarding your ED visits.        Tanda Rockers, PA-C 12/21/18 1018    Gerhard Munch, MD 12/21/18 1517

## 2018-12-21 NOTE — Discharge Instructions (Signed)
Please take medications as prescribed to help with jaw pain. Attached is a soft foot eating plan; follow this to allow your jaw to rest as hard foods can cause a lot of pain after having your jaw put back into place.   Follow up with your PCP regarding your ED visits.

## 2018-12-21 NOTE — Sedation Documentation (Signed)
Propofol 40 mg IV push given, per MD verbal order.

## 2018-12-21 NOTE — Sedation Documentation (Signed)
Propofol 30 mg IV push given, per MD verbal order.

## 2018-12-21 NOTE — Sedation Documentation (Signed)
Propofol 5.1.5 IV push given, per MD verbal order.

## 2018-12-21 NOTE — Sedation Documentation (Signed)
Propofol 20 mg IV push given, per MD verbal order.

## 2018-12-21 NOTE — ED Triage Notes (Signed)
Pt reports that this morning she was yawning and reports that her mouth got stuck and is painful. Was seen here recently for the same.

## 2018-12-21 NOTE — ED Provider Notes (Signed)
.  Sedation  Date/Time: 12/21/2018 9:15 AM Performed by: Carmin Muskrat, MD Authorized by: Carmin Muskrat, MD   Consent:    Consent obtained:  Verbal   Consent given by:  Patient   Risks discussed:  Nausea and inadequate sedation   Alternatives discussed:  Anxiolysis Universal protocol:    Procedure explained and questions answered to patient or proxy's satisfaction: yes     Relevant documents present and verified: yes     Test results available and properly labeled: yes     Imaging studies available: yes     Required blood products, implants, devices, and special equipment available: yes     Site/side marked: yes     Immediately prior to procedure a time out was called: yes     Patient identity confirmation method:  Verbally with patient Indications:    Procedure performed:  Dislocation reduction   Procedure necessitating sedation performed by:  Physician performing sedation (with physician assistant) Pre-sedation assessment:    Time since last food or drink:  3   ASA classification: class 1 - normal, healthy patient     Neck mobility: normal     Mouth opening:  3 or more finger widths   Thyromental distance:  4 finger widths   Mallampati score:  I - soft palate, uvula, fauces, pillars visible   Pre-sedation assessments completed and reviewed: airway patency, cardiovascular function, hydration status, mental status, nausea/vomiting, pain level, respiratory function and temperature     Pre-sedation assessment completed:  12/21/2018 9:15 AM Immediate pre-procedure details:    Reassessment: Patient reassessed immediately prior to procedure     Reviewed: vital signs, relevant labs/tests and NPO status     Verified: bag valve mask available, emergency equipment available, intubation equipment available, IV patency confirmed, oxygen available and suction available   Procedure details (see MAR for exact dosages):    Preoxygenation:  Nasal cannula   Sedation:  Propofol   Intended  level of sedation: deep   Intra-procedure monitoring:  Blood pressure monitoring, cardiac monitor, continuous pulse oximetry, frequent LOC assessments, frequent vital sign checks and continuous capnometry   Intra-procedure events: none     Total Provider sedation time (minutes):  20 Post-procedure details:    Post-sedation assessment completed:  12/21/2018 9:55 AM   Attendance: Constant attendance by certified staff until patient recovered     Recovery: Patient returned to pre-procedure baseline     Post-sedation assessments completed and reviewed: airway patency, cardiovascular function, hydration status, mental status, nausea/vomiting, pain level, respiratory function and temperature     Patient is stable for discharge or admission: yes     Patient tolerance:  Tolerated well, no immediate complications      Carmin Muskrat, MD 12/21/18 1517

## 2018-12-21 NOTE — ED Notes (Signed)
Patient AOx4 and ready to be discharge. Discharge instructions reviewed with patient, no further questions at this time. VS stable.

## 2019-01-18 ENCOUNTER — Emergency Department (HOSPITAL_COMMUNITY): Payer: Self-pay

## 2019-01-18 ENCOUNTER — Encounter (HOSPITAL_COMMUNITY): Payer: Self-pay

## 2019-01-18 ENCOUNTER — Emergency Department (HOSPITAL_COMMUNITY)
Admission: EM | Admit: 2019-01-18 | Discharge: 2019-01-18 | Disposition: A | Discharge status: Home / Self Care | Payer: Self-pay | Attending: Emergency Medicine | Admitting: Emergency Medicine

## 2019-01-18 ENCOUNTER — Other Ambulatory Visit: Payer: Self-pay

## 2019-01-18 DIAGNOSIS — S0302XA Dislocation of jaw, left side, initial encounter: Secondary | ICD-10-CM | POA: Insufficient documentation

## 2019-01-18 DIAGNOSIS — Y929 Unspecified place or not applicable: Secondary | ICD-10-CM | POA: Insufficient documentation

## 2019-01-18 DIAGNOSIS — S0300XA Dislocation of jaw, unspecified side, initial encounter: Secondary | ICD-10-CM

## 2019-01-18 DIAGNOSIS — Z79899 Other long term (current) drug therapy: Secondary | ICD-10-CM | POA: Insufficient documentation

## 2019-01-18 DIAGNOSIS — Y9389 Activity, other specified: Secondary | ICD-10-CM | POA: Insufficient documentation

## 2019-01-18 DIAGNOSIS — X500XXA Overexertion from strenuous movement or load, initial encounter: Secondary | ICD-10-CM | POA: Insufficient documentation

## 2019-01-18 DIAGNOSIS — Y999 Unspecified external cause status: Secondary | ICD-10-CM | POA: Insufficient documentation

## 2019-01-18 MED ORDER — PROPOFOL 10 MG/ML IV BOLUS
INTRAVENOUS | Status: AC | PRN
  Administered 2019-01-18: 40 mg via INTRAVENOUS
  Administered 2019-01-18 (×2): 10 mg via INTRAVENOUS
  Administered 2019-01-18: 20 mg via INTRAVENOUS

## 2019-01-18 MED ORDER — PROPOFOL 10 MG/ML IV BOLUS
100.0000 mg | Freq: Once | INTRAVENOUS | Status: AC
  Administered 2019-01-18: 15:00:00 100 mg via INTRAVENOUS
  Filled 2019-01-18: qty 20

## 2019-01-18 MED ORDER — PROPOFOL 10 MG/ML IV BOLUS
INTRAVENOUS | Status: AC | PRN
  Administered 2019-01-18: 30 mg via INTRAVENOUS
  Administered 2019-01-18 (×2): 20 mg via INTRAVENOUS
  Administered 2019-01-18: 30 mg via INTRAVENOUS
  Administered 2019-01-18: 20 mg via INTRAVENOUS

## 2019-01-18 NOTE — ED Triage Notes (Addendum)
Patient reports jaw has popped out again.   Patient has been seen for same in past.   Please see history patient usually sedated for jaw reset.   A/ox4 Ambulatory in triage.

## 2019-01-18 NOTE — Discharge Instructions (Signed)
Follow-up with the specialist listed below. Return to the ED if you start to experience worsening symptoms, trouble breathing or trouble swallowing or trouble opening your mouth.

## 2019-01-18 NOTE — ED Provider Notes (Signed)
  Physical Exam  BP 112/69   Pulse 65   Temp 97.9 F (36.6 C) (Oral)   Resp (!) 21   LMP 01/07/2019   SpO2 100%   Physical Exam  ED Course/Procedures     .Sedation  Date/Time: 01/18/2019 2:30 PM Performed by: Lorre Nick, MD Authorized by: Lorre Nick, MD   Consent:    Consent obtained:  Verbal   Consent given by:  Patient   Risks discussed:  Allergic reaction, dysrhythmia, inadequate sedation, nausea, prolonged hypoxia resulting in organ damage, prolonged sedation necessitating reversal, respiratory compromise necessitating ventilatory assistance and intubation and vomiting   Alternatives discussed:  Analgesia without sedation, anxiolysis and regional anesthesia Universal protocol:    Procedure explained and questions answered to patient or proxy's satisfaction: yes     Relevant documents present and verified: yes     Test results available and properly labeled: yes     Imaging studies available: yes     Required blood products, implants, devices, and special equipment available: yes     Site/side marked: yes     Immediately prior to procedure a time out was called: yes     Patient identity confirmation method:  Verbally with patient Indications:    Procedure necessitating sedation performed by:  Physician performing sedation Pre-sedation assessment:    Time since last food or drink:  800   ASA classification: class 1 - normal, healthy patient     Neck mobility: normal     Mouth opening:  3 or more finger widths   Thyromental distance:  4 finger widths   Mallampati score:  I - soft palate, uvula, fauces, pillars visible   Pre-sedation assessments completed and reviewed: airway patency, cardiovascular function, hydration status, mental status, nausea/vomiting, pain level, respiratory function and temperature   Immediate pre-procedure details:    Reassessment: Patient reassessed immediately prior to procedure     Reviewed: vital signs, relevant labs/tests and NPO  status     Verified: bag valve mask available, emergency equipment available, intubation equipment available, IV patency confirmed, oxygen available and suction available   Procedure details (see MAR for exact dosages):    Preoxygenation:  Nasal cannula   Sedation:  Propofol   Intended level of sedation: deep   Intra-procedure monitoring:  Blood pressure monitoring, cardiac monitor, continuous pulse oximetry, frequent LOC assessments, frequent vital sign checks and continuous capnometry   Intra-procedure events: none     Total Provider sedation time (minutes):  15 Post-procedure details:    Attendance: Constant attendance by certified staff until patient recovered     Recovery: Patient returned to pre-procedure baseline     Post-sedation assessments completed and reviewed: airway patency, cardiovascular function, hydration status, mental status, nausea/vomiting, pain level, respiratory function and temperature     Patient is stable for discharge or admission: yes     Patient tolerance:  Tolerated well, no immediate complications    MDM  Medical screening examination/treatment/procedure(s) were conducted as a shared visit with non-physician practitioner(s) and myself.  I personally evaluated the patient during the encounter.    39 year old female here complaining of left-sided jaw pain after yawning.  History of recurrent jaw dislocations.  Attempted to relocate jaw here and will check x-rays.       Lorre Nick, MD 01/18/19 1451

## 2019-01-18 NOTE — ED Provider Notes (Signed)
[facility]-EMERGENCY DEPT Provider Note   CSN: [id number] Arrival date & time: 01/18/19  0935     History Chief Complaint  Patient presents with  . Jaw Pain    Taylor Roman is a 39 y.o. female who presents to ED for left jaw dislocation.  States that this feels similar to her prior jaw dislocations.  It occurred this morning after she yawned.  She has not yet followed up with specialist regarding this recurrent dislocation.  She denies any injuries or trauma to the area.  Denies any other complaints.  HPI     Past Medical History:  Diagnosis Date  . Anemia    States has sickle cell trait, father of child without sickle cell trait  . Headache    otc med prn  . 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 Vaginal Pap smear, abnormal    10/2015 cervical conization with biopsy.  To have repeat pap in JUne 2018 at [facility]    Patient Active Problem List   Diagnosis Date Noted  . Medial epicondylitis of left elbow 12/01/2018  . Hemoglobin S-C disease (HCC)   . Anemia   . Vaginal Pap smear, abnormal   . Headache   . 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 CERVICAL CONIZATION W/BX N/A 11/03/2015   Procedure: CONIZATION CERVIX WITH BIOPSY;  Surgeon: [place] Bing, MD;  Location: [facility];  Service: Gynecology;  Laterality: N/A;  . HYSTEROSCOPY WITH D & C N/A 05/10/2017   Procedure: DILATATION AND CURETTAGE /HYSTEROSCOPY;  Surgeon: Conan Bowens, MD;  Location: [facility];  Service: Gynecology;  Laterality: N/A;  . NO PAST SURGERIES       OB History    Gravida  4   Para  4   Term  4   Preterm  0   AB  0   Living  3     SAB  0   TAB  0   Ectopic  0   Multiple  0   Live Births  4           Family History  Problem Relation Age of Onset   . Cancer Mother        Hepatic carcinoma?  No history of viral hepatitis she is aware of  . Diabetes Maternal Grandmother   . Anesthesia problems Neg Hx     Social History   Tobacco Use  . Smoking status: Never Smoker  . Smokeless tobacco: Never Used  Substance Use Topics  . Alcohol use: No  . Drug use: No    Home Medications Prior to Admission medications   Medication Sig Start Date End Date Taking? Authorizing Provider  ibuprofen (ADVIL,MOTRIN) 600 MG tablet Take 1 tablet (600 mg total) by mouth every 6 (six) hours as needed. 05/10/17  Yes Conan Bowens, MD  Multiple Vitamins-Minerals (MULTIVITAMIN WITH MINERALS) tablet Take 1 tablet by mouth daily.   Yes [provider]  naproxen (NAPROSYN) 500 MG tablet Take 1 tablet (500 mg total) by mouth 2 (two) times daily with a meal. Patient taking differently: Take 500 mg by mouth daily as needed for mild pain or moderate pain.  08/21/18  Yes Julieanne Manson, MD  methocarbamol (ROBAXIN) 500 MG tablet Take 1 tablet (500 mg total) by mouth  2 (two) times daily. Patient not taking: Reported on 01/18/2019 12/05/18   Eustaquio Maize, PA-C  methocarbamol (ROBAXIN) 500 MG tablet Take 1 tablet (500 mg total) by mouth 2 (two) times daily. Patient not taking: Reported on 01/18/2019 12/21/18   Eustaquio Maize, PA-C  naproxen (NAPROSYN) 500 MG tablet Take 1 tablet (500 mg total) by mouth 2 (two) times daily. Patient not taking: Reported on 01/18/2019 12/21/18   Eustaquio Maize, PA-C  predniSONE (DELTASONE) 20 MG tablet Take 1 tablet (20 mg total) by mouth daily with breakfast. Patient not taking: Reported on 01/18/2019 11/21/18   Mack Hook, MD    Allergies    Patient has no known allergies.  Review of Systems   Review of Systems  Constitutional: Negative for appetite change, chills and fever.  HENT: Negative for ear pain, rhinorrhea, sneezing and sore throat.        +jaw pain  Eyes: Negative for photophobia and visual  disturbance.  Respiratory: Negative for cough, chest tightness, shortness of breath and wheezing.   Cardiovascular: Negative for chest pain and palpitations.  Gastrointestinal: Negative for abdominal pain, blood in stool, constipation, diarrhea, nausea and vomiting.  Genitourinary: Negative for dysuria, hematuria and urgency.  Musculoskeletal: Negative for myalgias.  Skin: Negative for rash.  Neurological: Negative for dizziness, weakness and light-headedness.    Physical Exam Updated Vital Signs BP 123/85   Pulse (!) 51   Temp 97.9 F (36.6 C) (Oral)   Resp 16   LMP 01/07/2019   SpO2 100%   Physical Exam Vitals and nursing note reviewed.  Constitutional:      General: She is not in acute distress.    Appearance: She is well-developed.  HENT:     Head: Normocephalic and atraumatic.     Comments: No deformity to jaw but there is tenderness palpation of the left TMJ.  Can open mouth with 2 finger with but does have a gap when closing it.      Nose: Nose normal.  Eyes:     General: No scleral icterus.       Left eye: No discharge.     Conjunctiva/sclera: Conjunctivae normal.  Cardiovascular:     Rate and Rhythm: Normal rate and regular rhythm.     Heart sounds: Normal heart sounds. No murmur. No friction rub. No gallop.   Pulmonary:     Effort: Pulmonary effort is normal. No respiratory distress.     Breath sounds: Normal breath sounds.  Abdominal:     General: Bowel sounds are normal. There is no distension.     Palpations: Abdomen is soft.     Tenderness: There is no abdominal tenderness. There is no guarding.  Musculoskeletal:        General: Normal range of motion.     Cervical back: Normal range of motion and neck supple.  Skin:    General: Skin is warm and dry.     Findings: No rash.  Neurological:     Mental Status: She is alert.     Motor: No abnormal muscle tone.     Coordination: Coordination normal.     ED Results / Procedures / Treatments   Labs (all  labs ordered are listed, but only abnormal results are displayed) Labs Reviewed - No data to display  EKG None  Radiology DG Mandible 1-3 Views  Result Date: 01/18/2019 CLINICAL DATA:  Post reduction displaced jaw fracture. EXAM: MANDIBLE - 1-3 VIEW COMPARISON:  None. FINDINGS: No definite evidence of fracture. Paranasal  sinuses are well aerated. Patient slightly rotated to the right on the frontal film. Remainder the exam is unremarkable. IMPRESSION: No acute findings. Electronically Signed   By: Elberta Fortis M.D.   On: 01/18/2019 15:20    Procedures Reduction of dislocation  Date/Time: 01/18/2019 3:26 PM Performed by: Dietrich Pates, PA-C Authorized by: Dietrich Pates, PA-C  Consent: Verbal consent obtained. Written consent obtained. Risks and benefits: risks, benefits and alternatives were discussed Consent given by: patient Patient understanding: patient states understanding of the procedure being performed Patient consent: the patient's understanding of the procedure matches consent given Procedure consent: procedure consent matches procedure scheduled Relevant documents: relevant documents present and verified Test results: test results available and properly labeled Site marked: the operative site was marked Imaging studies: imaging studies available Patient identity confirmed: verbally with patient Time out: Immediately prior to procedure a "time out" was called to verify the correct patient, procedure, equipment, support staff and site/side marked as required. Preparation: Patient was prepped and draped in the usual sterile fashion. Local anesthesia used: no  Anesthesia: Local anesthesia used: no  Sedation: Patient sedated: yes Sedation type: moderate (conscious) sedation Sedatives: propofol Vitals: Vital signs were monitored during sedation.  Patient tolerance: patient tolerated the procedure well with no immediate complications Comments: See separate sedation note     (including critical care time)  Medications Ordered in ED Medications  propofol (DIPRIVAN) 10 mg/mL bolus/IV push 100 mg (100 mg Intravenous Given 01/18/19 1430)  propofol (DIPRIVAN) 10 mg/mL bolus/IV push (20 mg Intravenous Given 01/18/19 1429)  propofol (DIPRIVAN) 10 mg/mL bolus/IV push (30 mg Intravenous Given 01/18/19 1438)    ED Course  I have reviewed the triage vital signs and the nursing notes.  Pertinent labs & imaging results that were available during my care of the patient were reviewed by me and considered in my medical decision making (see chart for details).    MDM Rules/Calculators/A&P                      39 year old female presents to ED for left jaw dislocation.  This is her third visit for similar symptoms and she has not yet followed up with a specialist.  States that the jaw became dislocated after yawning this morning.  There is tenderness palpation of the left TMJ with a gap when trying to clench her teeth and inability to open her mouth fully.  Will attempt to sedate and reduce.  Unsure if reduction was performed with success.  Will obtain plain films and reassess.  Unfortunately there is no Panorex available at this facility.  Post reduction films without acute finding.  Patient reports improvement in her symptoms and is able to open and close mouth without difficulty.  States that it is "back to normal."  Will discharge with ENT follow-up.  Patient is hemodynamically stable, in NAD, and able to ambulate in the ED. Evaluation does not show pathology that would require ongoing emergent intervention or inpatient treatment. I explained the diagnosis to the patient. Pain has been managed and has no complaints prior to discharge. Patient is comfortable with above plan and is stable for discharge at this time. All questions were answered prior to disposition. Strict return precautions for returning to the ED were discussed. Encouraged follow up with PCP.   An After  Visit Summary was printed and given to the patient.   Portions of this note were generated with Scientist, clinical (histocompatibility and immunogenetics). Dictation errors may occur despite best attempts  at proofreading.   Final Clinical Impression(s) / ED Diagnoses Final diagnoses:  Dislocation of temporomandibular joint, initial encounter    Rx / DC Orders ED Discharge Orders    None       Dietrich Pates, PA-C 01/18/19 1527    Lorre Nick, MD 01/21/19 1525

## 2019-01-18 NOTE — ED Notes (Signed)
Patient ambulated unassisted; gait steady

## 2019-02-20 ENCOUNTER — Other Ambulatory Visit: Payer: Self-pay

## 2019-02-20 ENCOUNTER — Emergency Department (HOSPITAL_COMMUNITY)
Admission: EM | Admit: 2019-02-20 | Discharge: 2019-02-20 | Disposition: A | Discharge status: Home / Self Care | Payer: Self-pay | Attending: Emergency Medicine | Admitting: Emergency Medicine

## 2019-02-20 ENCOUNTER — Encounter (HOSPITAL_COMMUNITY): Payer: Self-pay | Admitting: Emergency Medicine

## 2019-02-20 ENCOUNTER — Emergency Department (HOSPITAL_COMMUNITY): Payer: Self-pay

## 2019-02-20 DIAGNOSIS — S0300XA Dislocation of jaw, unspecified side, initial encounter: Secondary | ICD-10-CM

## 2019-02-20 DIAGNOSIS — X58XXXA Exposure to other specified factors, initial encounter: Secondary | ICD-10-CM | POA: Insufficient documentation

## 2019-02-20 DIAGNOSIS — S0302XA Dislocation of jaw, left side, initial encounter: Secondary | ICD-10-CM | POA: Insufficient documentation

## 2019-02-20 DIAGNOSIS — Y929 Unspecified place or not applicable: Secondary | ICD-10-CM | POA: Insufficient documentation

## 2019-02-20 DIAGNOSIS — Z79899 Other long term (current) drug therapy: Secondary | ICD-10-CM | POA: Insufficient documentation

## 2019-02-20 DIAGNOSIS — Y9389 Activity, other specified: Secondary | ICD-10-CM | POA: Insufficient documentation

## 2019-02-20 DIAGNOSIS — Y999 Unspecified external cause status: Secondary | ICD-10-CM | POA: Insufficient documentation

## 2019-02-20 MED ORDER — PROPOFOL 10 MG/ML IV BOLUS
INTRAVENOUS | Status: AC | PRN
  Administered 2019-02-20: 50 mg via INTRAVENOUS
  Administered 2019-02-20: 30 mg via INTRAVENOUS
  Administered 2019-02-20: 20 mg via INTRAVENOUS

## 2019-02-20 MED ORDER — CYCLOBENZAPRINE HCL 5 MG PO TABS: 5.0000 mg | ORAL_TABLET | Freq: Three times a day (TID) | ORAL | 0 refills | Status: DC | PRN

## 2019-02-20 MED ORDER — SODIUM CHLORIDE 0.9 % IV SOLN
INTRAVENOUS | Status: AC | PRN
  Administered 2019-02-20: 1000 mL via INTRAVENOUS

## 2019-02-20 MED ORDER — DIAZEPAM 5 MG/ML IJ SOLN
5.0000 mg | Freq: Once | INTRAMUSCULAR | Status: AC
  Administered 2019-02-20: 09:00:00 5 mg via INTRAVENOUS
  Filled 2019-02-20: qty 2

## 2019-02-20 MED ORDER — PROPOFOL 10 MG/ML IV BOLUS
200.0000 mg | Freq: Once | INTRAVENOUS | Status: AC
  Administered 2019-02-20: 50 mg via INTRAVENOUS
  Filled 2019-02-20: qty 20

## 2019-02-20 MED ORDER — MORPHINE SULFATE (PF) 4 MG/ML IV SOLN
4.0000 mg | Freq: Once | INTRAVENOUS | Status: AC
  Administered 2019-02-20: 09:00:00 4 mg via INTRAVENOUS
  Filled 2019-02-20: qty 1

## 2019-02-20 NOTE — ED Provider Notes (Signed)
[facility]-EMERGENCY DEPT Provider Note   CSN: [id number] Arrival date & time: 02/20/19  2878     History Chief Complaint  Patient presents with  . Jaw Pain    Kiyona Vacca is a 39 y.o. female who presents for jaw dislocation that occurred this morning while eating some crackers. She notes she heard a pop and experienced immediate pain. Denies facial numbness. She is having pain with opening her jaw as well. Of note, per chart review, this appears to be her 4th dislocation since December. She has been referred to ENT for recurrent dislocations however has not yet arranged an appointment. Today, she notes that she is planning on visiting her PCP to have her help arrange this appt.      Past Medical History:  Diagnosis Date  . Anemia    States has sickle cell trait, father of child without sickle cell trait  . Headache    otc med prn  . 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 Vaginal Pap smear, abnormal    10/2015 cervical conization with biopsy.  To have repeat pap in JUne 2018 at [facility]    Patient Active Problem List   Diagnosis Date Noted  . Medial epicondylitis of left elbow 12/01/2018  . Hemoglobin S-C disease (HCC)   . Anemia   . Vaginal Pap smear, abnormal   . Headache   . 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 CERVICAL CONIZATION W/BX N/A 11/03/2015   Procedure: CONIZATION CERVIX WITH BIOPSY;  Surgeon: [place] Bing, MD;  Location: [facility];  Service: Gynecology;  Laterality: N/A;  . HYSTEROSCOPY WITH D & C N/A 05/10/2017   Procedure: DILATATION AND CURETTAGE /HYSTEROSCOPY;  Surgeon: Conan Bowens, MD;  Location: [facility];  Service: Gynecology;  Laterality: N/A;  . NO PAST SURGERIES       OB History    Gravida  4   Para  4   Term    4   Preterm  0   AB  0   Living  3     SAB  0   TAB  0   Ectopic  0   Multiple  0   Live Births  4           Family History  Problem Relation Age of Onset  . Cancer Mother        Hepatic carcinoma?  No history of viral hepatitis she is aware of  . Diabetes Maternal Grandmother   . Anesthesia problems Neg Hx     Social History   Tobacco Use  . Smoking status: Never Smoker  . Smokeless tobacco: Never Used  Substance Use Topics  . Alcohol use: No  . Drug use: No    Home Medications Prior to Admission medications   Medication Sig Start Date End Date Taking? Authorizing Provider  ferrous sulfate 325 (65 FE) MG tablet Take 325 mg by mouth daily with breakfast.   Yes [provider]  ibuprofen (ADVIL,MOTRIN) 600 MG tablet Take 1 tablet (600 mg total) by mouth every 6 (six) hours as needed. 05/10/17  Yes Conan Bowens, MD  naproxen (NAPROSYN) 500 MG tablet Take 1 tablet (500 mg total) by mouth 2 (two) times daily. Patient taking differently: Take 500 mg by mouth daily  as needed for mild pain or moderate pain.  12/21/18  Yes Venter, Margaux, PA-C  cyclobenzaprine (FLEXERIL) 5 MG tablet Take 1 tablet (5 mg total) by mouth 3 (three) times daily as needed for muscle spasms. 02/20/19   Elige Radon, MD  methocarbamol (ROBAXIN) 500 MG tablet Take 1 tablet (500 mg total) by mouth 2 (two) times daily. Patient not taking: Reported on 01/18/2019 12/05/18   Tanda Rockers, PA-C  methocarbamol (ROBAXIN) 500 MG tablet Take 1 tablet (500 mg total) by mouth 2 (two) times daily. Patient not taking: Reported on 01/18/2019 12/21/18   Tanda Rockers, PA-C  naproxen (NAPROSYN) 500 MG tablet Take 1 tablet (500 mg total) by mouth 2 (two) times daily with a meal. Patient taking differently: Take 500 mg by mouth daily as needed for mild pain or moderate pain.  08/21/18   Julieanne Manson, MD  predniSONE (DELTASONE) 20 MG tablet Take 1 tablet (20 mg total) by mouth daily with  breakfast. Patient not taking: Reported on 01/18/2019 11/21/18   Julieanne Manson, MD    Allergies    Patient has no known allergies.  Review of Systems   Review of Systems  Constitutional: Negative for chills and fever.  HENT: Negative for dental problem, drooling, facial swelling and sore throat.        Jaw pain  Respiratory: Negative.   Cardiovascular: Negative.   Gastrointestinal: Negative.   Genitourinary: Negative.   Musculoskeletal: Negative.   Skin: Negative.   Neurological: Negative.   Psychiatric/Behavioral: Negative.     Physical Exam Updated Vital Signs BP 118/73   Pulse (!) 53   Temp 98.9 F (37.2 C) (Oral)   Resp 14   Ht 5\' 7"  (1.702 m)   Wt 97.5 kg   SpO2 100%   BMI 33.67 kg/m   Physical Exam Constitutional:      General: She is not in acute distress. HENT:     Head: Atraumatic.     Jaw: Tenderness and pain on movement present.     Comments: Pain with palpation over the left TMJ. Can open mouth about 1in. Cardiovascular:     Rate and Rhythm: Normal rate and regular rhythm.  Pulmonary:     Effort: Pulmonary effort is normal.     Breath sounds: Normal breath sounds.  Abdominal:     General: Bowel sounds are normal.  Musculoskeletal:        General: No signs of injury.  Skin:    General: Skin is warm and dry.  Neurological:     General: No focal deficit present.     Mental Status: She is alert.  Psychiatric:        Mood and Affect: Mood normal.     ED Results / Procedures / Treatments   Labs (all labs ordered are listed, but only abnormal results are displayed) Labs Reviewed - No data to display  EKG None  Radiology CT Maxillofacial Wo Contrast  Result Date: 02/20/2019 CLINICAL DATA:  Left jaw pain EXAM: CT MAXILLOFACIAL WITHOUT CONTRAST TECHNIQUE: Multidetector CT imaging of the maxillofacial structures was performed. Multiplanar CT image reconstructions were also generated. COMPARISON:  12/05/2018 FINDINGS: Osseous: As noted  previously, the articular fossa for the left mandibular condyle is unusually deep with thin bone the roof. However, unlike the prior study the condyle is aligned with the fossa as would be expected with the mouth closed. Right mandibular condyle is appropriately located. There is no acute fracture. Unchanged periapical lucency about the anterior right mandibular molar.  Orbits: Unremarkable. Sinuses: Minor mucosal thickening. Soft tissues: Unremarkable. Limited intracranial: No acute abnormality. IMPRESSION: Abnormal appearance of the articular fossa for the left mandibular condyle as seen previously. However, the condyle is appropriately aligned on this study. No new findings. Electronically Signed   By: Macy Mis M.D.   On: 02/20/2019 10:30    Procedures Procedures (including critical care time)  Medications Ordered in ED Medications  0.9 %  sodium chloride infusion (1,000 mLs Intravenous New Bag/Given 02/20/19 0930)  propofol (DIPRIVAN) 10 mg/mL bolus/IV push (50 mg Intravenous Given 02/20/19 0938)  morphine 4 MG/ML injection 4 mg (4 mg Intravenous Given 02/20/19 0853)  diazepam (VALIUM) injection 5 mg (5 mg Intravenous Given 02/20/19 0853)  propofol (DIPRIVAN) 10 mg/mL bolus/IV push 200 mg (50 mg Intravenous Given 02/20/19 0935)    ED Course  I have reviewed the triage vital signs and the nursing notes.  Pertinent labs & imaging results that were available during my care of the patient were reviewed by me and considered in my medical decision making (see chart for details).    MDM Rules/Calculators/A&P                      This is a 39 yo female presenting with jaw pain. 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 Most recent event prior to today was 1/15 at which time it was reduced and she had improvement in pain.   12:20 PM Dr. Darl Householder attempted reduction with valium and pain management. 12:20 PM Left TMJ sucessfully reduced by Dr. Darl Householder with conscious  sedation. She tolerated the procedure well. Maxillofacial CT today confirms successful reduction.  12:20 PM Spoke with Dr. Benson Norway with oral surgery. At this point, no surgery recommendations. Recommended keeping her jaw closed as much as possible over the next two weeks, flexeril, and ibuprofen. If she continues to have dislocations, he recommended she be seen by a specialist in [facility].  12:20 PM Discussed recommendations with Anayla. Implied understanding. Stable for discharge.    Final Clinical Impression(s) / ED Diagnoses Final diagnoses:  Dislocation of temporomandibular joint, initial encounter    Rx / DC Orders ED Discharge Orders         Ordered    cyclobenzaprine (FLEXERIL) 5 MG tablet  3 times daily PRN     02/20/19 1210           Mitzi Hansen, MD 02/20/19 1248    Drenda Freeze, MD 02/21/19 0700

## 2019-02-20 NOTE — Discharge Instructions (Addendum)
It was a pleasure meeting you today. I spoke with the oral surgeon regarding your repeated jaw dislocations. He recommends that you try to keep you jaw closed over the next two weeks. You can do this with something like an ACE bandage that would loop around from your jaw to the top of your head. He recommends that you try to not open your jaw more than 1in (2.5cm) during this time. I have also sent in flexeril which is a muscle relaxant to help with the pain and to help your jaw heal. I would also recommend you use ibuprofen to decease some of the inflammation. If you continue to dislocate your jaw, the oral surgeon recommended you go to a specialist in [facility] that specializes in the specific surgery that you would need. Your primary care doctor can refer you there if you end up needing it.

## 2019-02-20 NOTE — ED Provider Notes (Signed)
Physical Exam  BP 108/63   Pulse (!) 57   Temp 98.9 F (37.2 C) (Oral)   Resp 19   Ht 5\' 7"  (1.702 m)   Wt 97.5 kg   SpO2 100%   BMI 33.67 kg/m   Physical Exam  ED Course/Procedures     Reduction of dislocation  Date/Time: 02/20/2019 9:50 AM Performed by: 02/22/2019, MD Authorized by: Charlynne Pander, MD  Consent: Verbal consent obtained. Risks and benefits: risks, benefits and alternatives were discussed Consent given by: patient Patient understanding: patient states understanding of the procedure being performed Patient consent: the patient's understanding of the procedure matches consent given Procedure consent: procedure consent matches procedure scheduled Relevant documents: relevant documents present and verified Test results: test results available and properly labeled Imaging studies: imaging studies available Patient identity confirmed: verbally with patient Local anesthesia used: no  Anesthesia: Local anesthesia used: no  Sedation: Patient sedated: yes Sedation type: moderate (conscious) sedation Sedatives: see MAR for details  Patient tolerance: patient tolerated the procedure well with no immediate complications  .Sedation  Date/Time: 02/21/2019 7:00 AM Performed by: 02/23/2019, MD Authorized by: Charlynne Pander, MD   Consent:    Consent obtained:  Verbal   Consent given by:  Patient   Risks discussed:  Allergic reaction, dysrhythmia, inadequate sedation, nausea, prolonged hypoxia resulting in organ damage, prolonged sedation necessitating reversal, respiratory compromise necessitating ventilatory assistance and intubation and vomiting   Alternatives discussed:  Analgesia without sedation, anxiolysis and regional anesthesia Universal protocol:    Procedure explained and questions answered to patient or proxy's satisfaction: yes     Relevant documents present and verified: yes     Test results available and properly labeled:  yes     Imaging studies available: yes     Required blood products, implants, devices, and special equipment available: yes     Site/side marked: yes     Immediately prior to procedure a time out was called: yes     Patient identity confirmation method:  Verbally with patient Indications:    Procedure necessitating sedation performed by:  Physician performing sedation Pre-sedation assessment:    Time since last food or drink:  2 hours   ASA classification: class 1 - normal, healthy patient     Neck mobility: normal     Mouth opening:  3 or more finger widths   Thyromental distance:  4 finger widths   Mallampati score:  I - soft palate, uvula, fauces, pillars visible   Pre-sedation assessments completed and reviewed: airway patency, cardiovascular function, hydration status, mental status, nausea/vomiting, pain level, respiratory function and temperature   Immediate pre-procedure details:    Reassessment: Patient reassessed immediately prior to procedure     Reviewed: vital signs, relevant labs/tests and NPO status     Verified: bag valve mask available, emergency equipment available, intubation equipment available, IV patency confirmed, oxygen available and suction available   Procedure details (see MAR for exact dosages):    Preoxygenation:  Nasal cannula   Sedation:  Propofol   Intended level of sedation: deep   Intra-procedure monitoring:  Blood pressure monitoring, cardiac monitor, continuous pulse oximetry, frequent LOC assessments, frequent vital sign checks and continuous capnometry   Intra-procedure events: none     Total Provider sedation time (minutes):  30 Post-procedure details:    Attendance: Constant attendance by certified staff until patient recovered     Recovery: Patient returned to pre-procedure baseline     Post-sedation assessments  completed and reviewed: airway patency, cardiovascular function, hydration status, mental status, nausea/vomiting, pain level,  respiratory function and temperature     Patient is stable for discharge or admission: yes     Patient tolerance:  Tolerated well, no immediate complications    MDM  I saw and evaluated the patient, reviewed the resident's note and I agree with the findings and plan.  EKG:    Taylor Roman is a 39 y.o. female history of recurrent left jaw dislocation here presenting with left jaw dislocation after eating crackers.  On exam, she does have evidence of anterior jaw dislocation.  I try to give her some pain medicine and muscle relaxants to put her back came back I was unable to.  I performed conscious sedation and was able to reduce the jaw.  Afterwards, we got a CT to confirm the placement.  We also talked to Dr. Benson Norway from oral surgery.  He recommend Ace wrap and soft diet.  If patient needs surgery, she will likely need referral to [facility] for specialist.  Stable for discharge.       Drenda Freeze, MD 02/21/19 0700

## 2019-02-22 ENCOUNTER — Ambulatory Visit: Payer: Self-pay | Admitting: Internal Medicine

## 2019-03-20 IMAGING — US US TRANSVAGINAL NON-OB
1 series · 13 of 25 positions shown · non-contrast
Comparison: CT abdomen pelvis 04/30/2017; pelvic ultrasound
05/30/2015

CLINICAL DATA: Patient with pelvic pain.  History of hematometra.

EXAM:
TRANSABDOMINAL AND TRANSVAGINAL ULTRASOUND OF PELVIS
TECHNIQUE: Both transabdominal and transvaginal ultrasound examinations of the
pelvis were performed. Transabdominal technique was performed for
global imaging of the pelvis including uterus, ovaries, adnexal
regions, and pelvic cul-de-sac. It was necessary to proceed with
endovaginal exam following the transabdominal exam to visualize the
adnexal structures.

[Series 1: us transvaginal non-ob · 0.22mm/px · 13 of 76 slices shown]
[im 1/76]
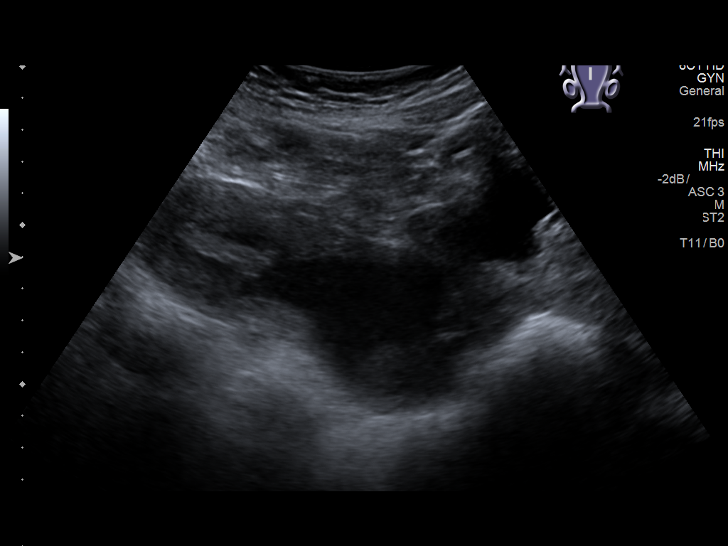
[im 7/76]
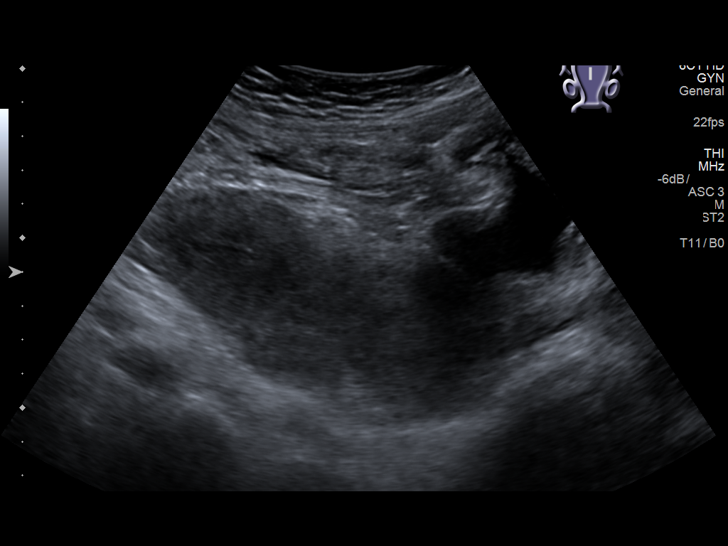
[im 13/76]
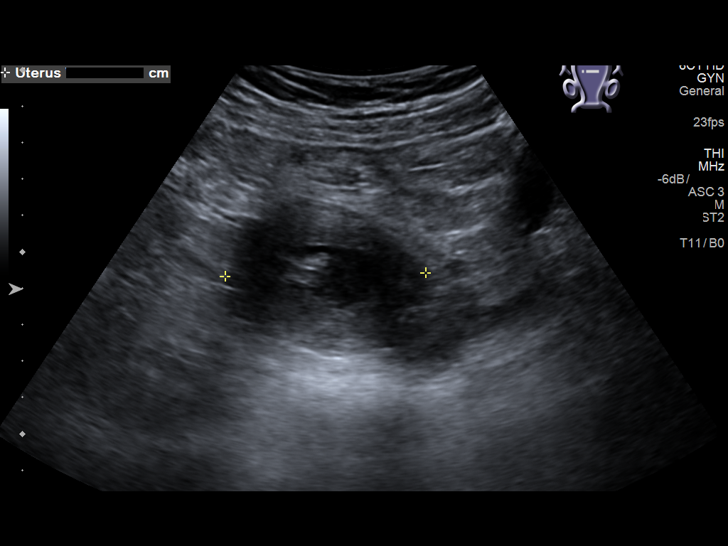
[im 19/76]
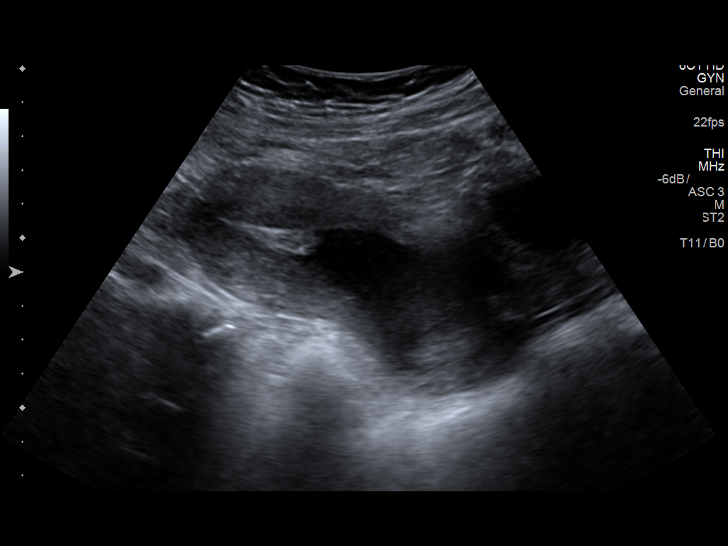
[im 26/76]
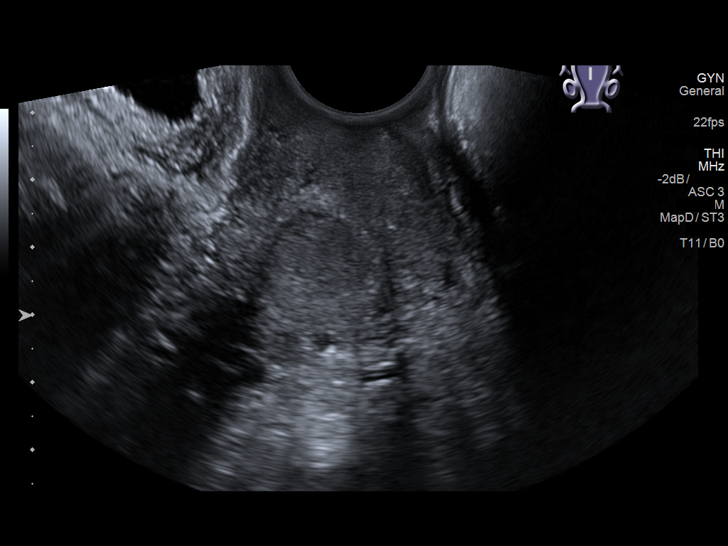
[im 32/76]
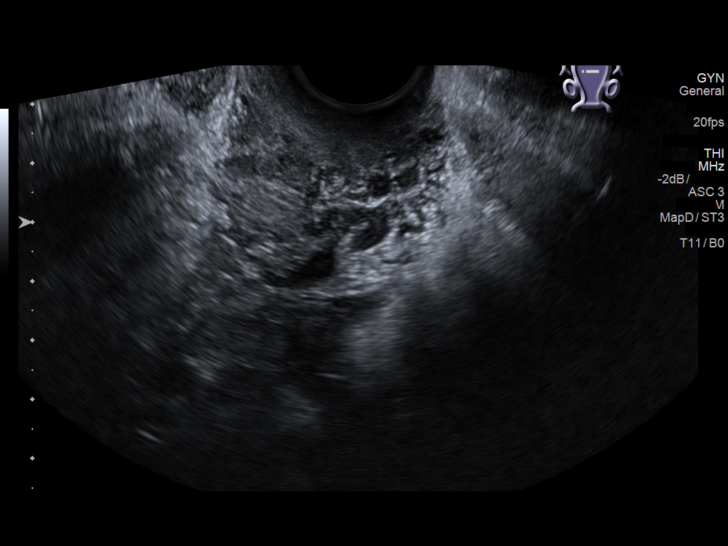
[im 38/76]
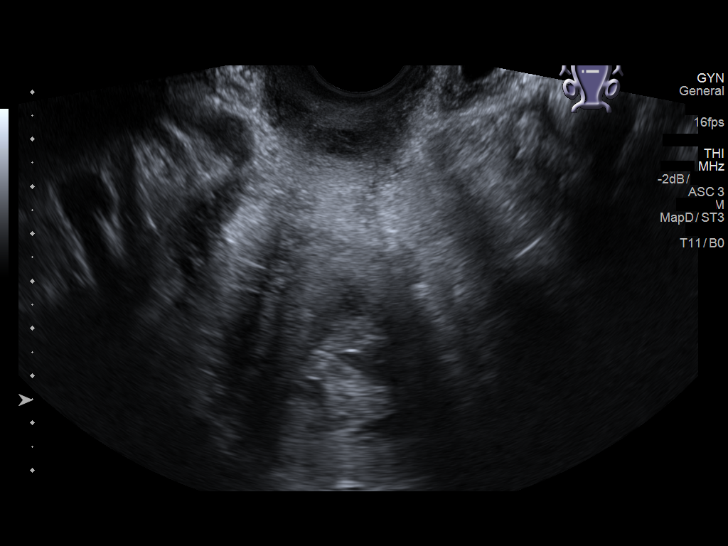
[im 44/76]
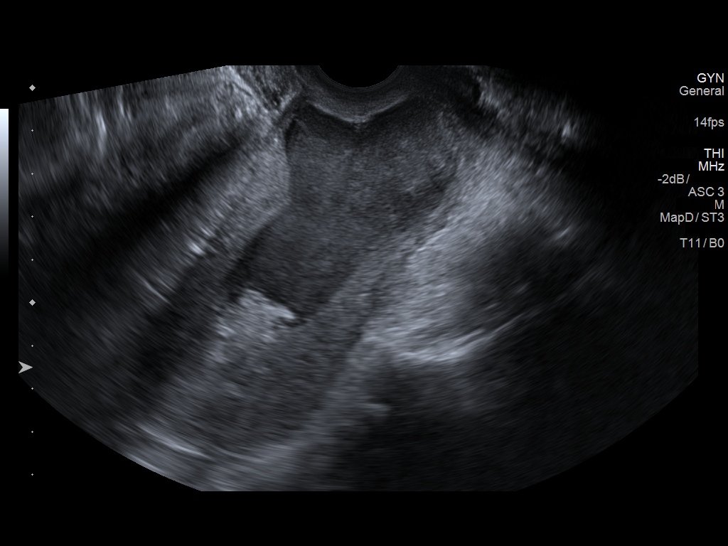
[im 51/76]
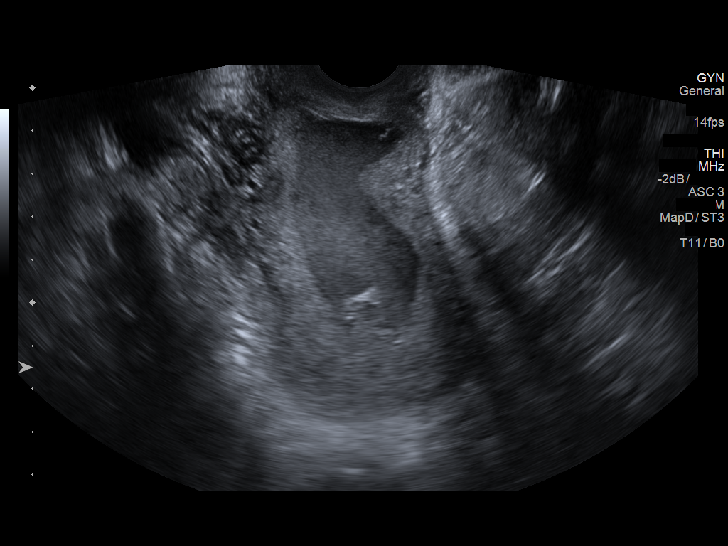
[im 57/76]
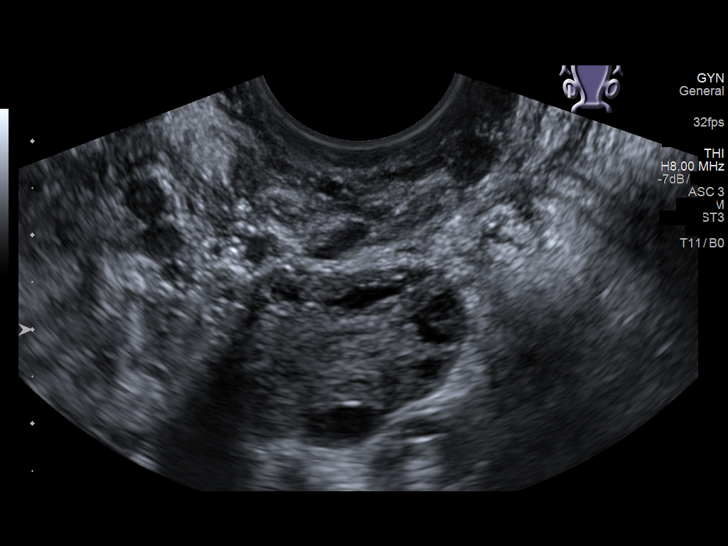
[im 63/76]
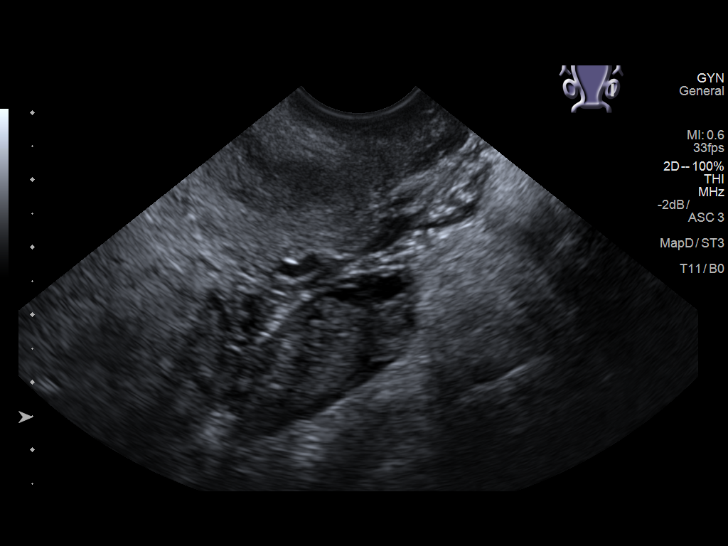
[im 69/76]
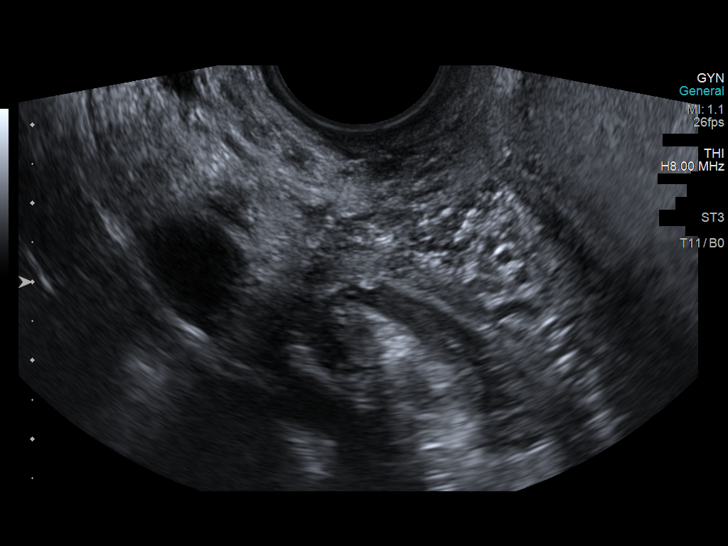
[im 76/76]
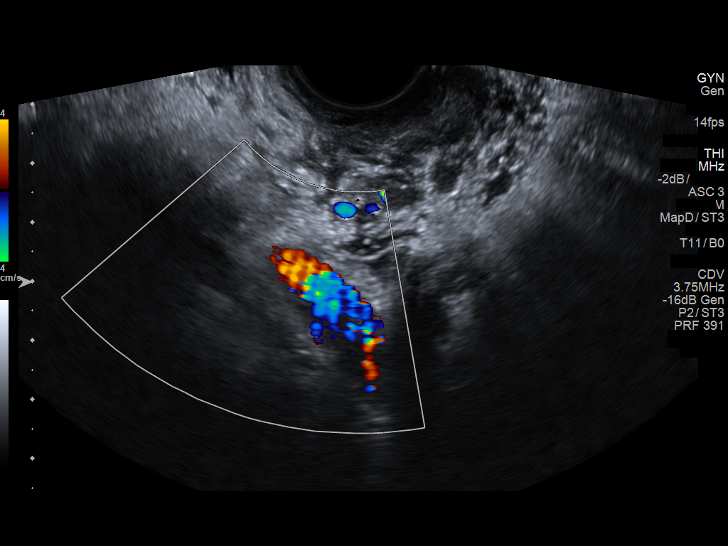

[13 of 25 positions shown; findings below may reference images not displayed]

FINDINGS: Uterus

Measurements: 12.0 x 4.8 x 5.5 cm. No fibroids or other mass
visualized.

Endometrium

Endometrial cavity is distended with complex appearing fluid,
suggestive of blood products. Within the endometrial cavity near the
uterine fundus there is a 3.3 x 1.7 x 1.7 cm echogenic masslike
area.

Right ovary

Measurements: 2.9 x 1.9 x 2.4 cm. Normal appearance/no adnexal mass.

Left ovary

Measurements: 3.5 x 1.5 x 2.4 cm. Normal appearance/no adnexal mass.

Other findings

No abnormal free fluid.
IMPRESSION: 1. The endometrial cavity is distended with complex appearing fluid,
suggestive of blood products. This may be secondary to cervical
stenosis. Cervical malignancy as causative etiology is not excluded.
2. Focal masslike area of echogenic material within the endometrium
near the uterine fundus may represent focal endometrial hyperplasia
or polyp. Endometrial malignancy not excluded. Consider further
evaluation with sonohysterogram and possible endometrial biopsy
after resolution of acute process.

## 2019-03-25 ENCOUNTER — Ambulatory Visit: Payer: Self-pay | Admitting: Internal Medicine

## 2019-04-19 ENCOUNTER — Ambulatory Visit: Payer: Self-pay | Admitting: Internal Medicine

## 2019-04-19 ENCOUNTER — Other Ambulatory Visit: Payer: Self-pay

## 2019-04-19 ENCOUNTER — Encounter: Payer: Self-pay | Admitting: Internal Medicine

## 2019-04-19 VITALS — BP 118/70 | HR 72 | Resp 12 | Ht 67.0 in | Wt 223.0 lb

## 2019-04-19 DIAGNOSIS — M7702 Medial epicondylitis, left elbow: Secondary | ICD-10-CM

## 2019-04-19 DIAGNOSIS — S0302XS Dislocation of jaw, left side, sequela: Secondary | ICD-10-CM

## 2019-04-19 DIAGNOSIS — J302 Other seasonal allergic rhinitis: Secondary | ICD-10-CM

## 2019-04-19 DIAGNOSIS — S0302XA Dislocation of jaw, left side, initial encounter: Secondary | ICD-10-CM | POA: Insufficient documentation

## 2019-04-19 MED ORDER — LORATADINE 10 MG PO TABS: 10.0000 mg | ORAL_TABLET | Freq: Every day | ORAL | 11 refills | Status: DC

## 2019-04-19 NOTE — Progress Notes (Signed)
    Subjective:    Patient ID: Taylor Roman, female   DOB: [birth year], 39 y.o.   MRN: [id number]   HPI   1.  Left arm pain:  Medial epicondylitis.  States she has gone for PT weekly with [facility] since I saw her in November.  Later, states for maybe 3 months.  She states no improvement in her symptoms States her arm was splinted at one point.   Not clear that she wore the aircast type arm splint for more than a week. Does state the prednisone burst helped, but then did not wear the splint as above regularly.   She is taking Naproxen for her problem recently with repeated dislocated jaw.    2.  Repeated dislocation of left jaw:  States originally occurred when yelling at her kids back in December.  Has had repeated dislocations with yawning, chewing since x 3.  Still with discomfort of left TMJ area since and has to be very careful with chewing/yawning/chewing out kids since.    3. Seasonal allergies:  Itching of nose and eyes.  Sneezing all the time.  No throat symptoms.  No ear complaints.  Has tried Claritin, which helps.    Current Meds  Medication Sig  . naproxen (NAPROSYN) 500 MG tablet Take 1 tablet (500 mg total) by mouth 2 (two) times daily with a meal. (Patient taking differently: Take 500 mg by mouth daily as needed for mild pain or moderate pain. )   No Known Allergies   Review of Systems    Objective:   BP 118/70 (BP Location: Left Arm, Patient Position: Sitting, Cuff Size: Normal)   Pulse 72   Resp 12   Ht 5\' 7"  (1.702 m)   Wt 223 lb (101.2 kg)   LMP 04/01/2019   BMI 34.93 kg/m   Physical Exam  NAD HEENT:  PERRL, EOMI, conjunctivae without injection.  No drainage or tearing Nasal mucosa swollen with clear discharge mainly on right.  Left nostril normal.  TMs pearly gray, throat without injection or cobbling.   Tender over left TMJ with opening and closing of jaw.  No swelling or crepitation. Neck:  Supple, No adenopathy Chest:  CTA MS:   Left medial epicondyle and attaching tendons tender.  Proximal ulnar forearm musculature mildly tender.  No obvious swelling.  No crepitation.  Pain with supination against opposing force, but not pronation. CV:  RRR without murmur or rub.    Assessment & Plan  1.  Left Medial epicondylitis:  Has not responded to conservative treatment, though not compliant with splint use.   Referral to 04/03/2019.  ?injection or immobilization splint?  2.  Recurrent left jaw dislocation:  Evaluate and recommendations for prevention.  3.  Seasonal allergies:  Discussed this is not cured, just treated and also to prevent with avoidance. They do remove shoes coming into home.   To take Loratadine daily through higher pollen season. Wash face/hair when comes indoors

## 2019-05-09 ENCOUNTER — Ambulatory Visit (INDEPENDENT_AMBULATORY_CARE_PROVIDER_SITE_OTHER): Payer: Self-pay | Admitting: Orthopaedic Surgery

## 2019-05-09 ENCOUNTER — Encounter: Payer: Self-pay | Admitting: Orthopaedic Surgery

## 2019-05-09 ENCOUNTER — Ambulatory Visit (INDEPENDENT_AMBULATORY_CARE_PROVIDER_SITE_OTHER): Payer: Self-pay

## 2019-05-09 ENCOUNTER — Other Ambulatory Visit: Payer: Self-pay

## 2019-05-09 VITALS — Ht 67.0 in | Wt 214.0 lb

## 2019-05-09 DIAGNOSIS — M25422 Effusion, left elbow: Secondary | ICD-10-CM | POA: Insufficient documentation

## 2019-05-09 DIAGNOSIS — M7712 Lateral epicondylitis, left elbow: Secondary | ICD-10-CM | POA: Insufficient documentation

## 2019-05-09 DIAGNOSIS — M25522 Pain in left elbow: Secondary | ICD-10-CM

## 2019-05-09 MED ORDER — LIDOCAINE HCL 1 % IJ SOLN
0.5000 mL | INTRAMUSCULAR | Status: AC | PRN
  Administered 2019-05-09: .5 mL

## 2019-05-09 MED ORDER — BUPIVACAINE HCL 0.25 % IJ SOLN
0.3300 mL | INTRAMUSCULAR | Status: AC | PRN
  Administered 2019-05-09: .33 mL

## 2019-05-09 MED ORDER — METHYLPREDNISOLONE ACETATE 40 MG/ML IJ SUSP
13.3300 mg | INTRAMUSCULAR | Status: AC | PRN
  Administered 2019-05-09: 13.33 mg

## 2019-05-09 NOTE — Progress Notes (Signed)
Office Visit Note   Patient: Taylor Roman           Date of Birth: March 13, 1980           MRN: [id number] Visit Date: 05/09/2019              Requested by: Mack Hook, MD [place],  [place] [id number] PCP: Mack Hook, MD   Assessment & Plan: Visit Diagnoses:  1. Pain in left elbow   2. Lateral epicondylitis, left elbow   3. Effusion of left elbow     Plan:  #1: Corticosteroid injection was placed into the left lateral epicondylar area where it was a point of her pain.  Also a small aliquot was placed intra-articular into the elbow. #2: She will continue to use of Advil and ice to the elbow. #3: We will see her back in 2 weeks reexamine her and if necessary consider that of an MRI scan.  If an MRI scan is indicated she will need to check on her coverage of this procedure.  Follow-Up Instructions: Return in about 2 weeks (around 05/23/2019).   Orders:  Orders Placed This Encounter  Procedures  . XR Elbow 2 Views Left   No orders of the defined types were placed in this encounter.     Procedures: Hand/UE Inj: L elbow for lateral epicondylitis on 05/09/2019 2:26 PM Details: 25 G needle, lateral approach Medications: 0.5 mL lidocaine 1 %; 0.33 mL bupivacaine 0.25 %; 13.33 mg methylPREDNISolone acetate 40 MG/ML Outcome: tolerated well, no immediate complications Procedure, treatment alternatives, risks and benefits explained, specific risks discussed. Consent was given by the patient. Immediately prior to procedure a time out was called to verify the correct patient, procedure, equipment, support staff and site/side marked as required. Patient was prepped and draped in the usual sterile fashion.    Small amount injected into the elbow   Clinical Data: No additional findings.   Subjective: Chief Complaint  Patient presents with  . Left Elbow - Pain  HPI Patient presents today for left elbow pain. She said that it has been hurting for a year.  The pain is located all around the elbow. She said that the pain is constant, but worsens when lifting anything heavy or sleeping on that side. She has been taking Naproxen and gets minimal relief. She is right hand dominant. She states that it does swell. The pain radiates up and down her arm. She has been going to physical therapy in [facility] once weekly and has not noticed improvement.   Review of Systems  Constitutional: Negative for fatigue.  HENT: Negative for ear pain.   Eyes: Negative for pain.  Respiratory: Negative for shortness of breath.   Cardiovascular: Negative for leg swelling.  Gastrointestinal: Negative for constipation and diarrhea.  Endocrine: Negative for cold intolerance and heat intolerance.  Genitourinary: Negative for difficulty urinating.  Musculoskeletal: Positive for joint swelling.  Skin: Negative for rash.  Allergic/Immunologic: Negative for food allergies.  Neurological: Negative for weakness.  Hematological: Does not bruise/bleed easily.  Psychiatric/Behavioral: Negative for sleep disturbance.     Objective: Vital Signs: Ht 5\' 7"  (1.702 m)   Wt 214 lb (97.1 kg)   BMI 33.52 kg/m   Physical Exam Constitutional:      Appearance: She is well-developed.  Eyes:     Pupils: Pupils are equal, round, and reactive to light.  Pulmonary:     Effort: Pulmonary effort is normal.  Skin:    General:  Skin is warm and dry.  Neurological:     Mental Status: She is alert and oriented to person, place, and time.  Psychiatric:        Behavior: Behavior normal.     Ortho Exam  Exam today reveals tenderness over the lateral epicondylar area.  Could not get any over the medial epicondyle.  She lacks about 3 to 5 degrees of extension.  Flexes to about 105 degrees.  Pronation supination about 45 degrees each.  She does have a little bit of tenderness in the biceps tendon or that of the anterior elbow.  Neurovascular intact distally.   Specialty Comments:  No  specialty comments available.  Imaging: XR Elbow 2 Views Left  Result Date: 05/09/2019 Three-view x-ray of the left elbow did not reveal any prominence or osseous areas over the lateral or medial epicondylar area.  She does have what appears to be a small effusion intra-articular. Dr Cleophas Dunker concurred    PMFS History: Current Outpatient Medications  Medication Sig Dispense Refill  . cyclobenzaprine (FLEXERIL) 5 MG tablet Take 1 tablet (5 mg total) by mouth 3 (three) times daily as needed for muscle spasms. 30 tablet 0  . ferrous sulfate 325 (65 FE) MG tablet Take 325 mg by mouth daily with breakfast.    . ibuprofen (ADVIL,MOTRIN) 600 MG tablet Take 1 tablet (600 mg total) by mouth every 6 (six) hours as needed. 30 tablet 1  . loratadine (CLARITIN) 10 MG tablet Take 1 tablet (10 mg total) by mouth daily. 30 tablet 11  . methocarbamol (ROBAXIN) 500 MG tablet Take 1 tablet (500 mg total) by mouth 2 (two) times daily. 20 tablet 0  . methocarbamol (ROBAXIN) 500 MG tablet Take 1 tablet (500 mg total) by mouth 2 (two) times daily. 20 tablet 0  . naproxen (NAPROSYN) 500 MG tablet Take 1 tablet (500 mg total) by mouth 2 (two) times daily with a meal. (Patient taking differently: Take 500 mg by mouth daily as needed for mild pain or moderate pain. ) 60 tablet 1  . naproxen (NAPROSYN) 500 MG tablet Take 1 tablet (500 mg total) by mouth 2 (two) times daily. 30 tablet 0  . predniSONE (DELTASONE) 20 MG tablet Take 1 tablet (20 mg total) by mouth daily with breakfast. 5 tablet 0   No current facility-administered medications for this visit.    Patient Active Problem List   Diagnosis Date Noted  . Lateral epicondylitis, left elbow 05/09/2019  . Effusion of left elbow 05/09/2019  . Seasonal allergies 04/19/2019  . Closed dislocation of left jaw 04/19/2019  . Medial epicondylitis of left elbow 12/01/2018  . Hemoglobin S-C disease (HCC)   . Anemia   . Vaginal Pap smear, abnormal   . Headache   .  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 Anemia    States has sickle cell trait, father of child without sickle cell trait  . Headache    otc med prn  . 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 Vaginal Pap smear, abnormal    10/2015 cervical conization with biopsy.  To have repeat pap in JUne 2018 at [facility]    Family History  Problem Relation Age of Onset  . Cancer Mother        Hepatic carcinoma?  No history of viral hepatitis  she is aware of  . Diabetes Maternal Grandmother   . Anesthesia problems Neg Hx     Past Surgical History:  Procedure Laterality Date  . CERVICAL CONIZATION W/BX N/A 11/03/2015   Procedure: CONIZATION CERVIX WITH BIOPSY;  Surgeon: Lakeland South Bing, MD;  Location: [facility];  Service: Gynecology;  Laterality: N/A;  . HYSTEROSCOPY WITH D & C N/A 05/10/2017   Procedure: DILATATION AND CURETTAGE /HYSTEROSCOPY;  Surgeon: Conan Bowens, MD;  Location: [facility];  Service: Gynecology;  Laterality: N/A;  . NO PAST SURGERIES     Social History   Occupational History  . Occupation: Hair care  Tobacco Use  . Smoking status: Never Smoker  . Smokeless tobacco: Never Used  Substance and Sexual Activity  . Alcohol use: No  . Drug use: No  . Sexual activity: Not Currently    Birth control/protection: None

## 2019-05-29 ENCOUNTER — Ambulatory Visit: Payer: Self-pay | Admitting: Orthopaedic Surgery

## 2019-05-29 ENCOUNTER — Inpatient Hospital Stay (HOSPITAL_COMMUNITY)
Admission: AD | Admit: 2019-05-29 | Discharge: 2019-05-29 | Disposition: A | Discharge status: Home / Self Care | Payer: Self-pay | Attending: Family Medicine | Admitting: Family Medicine

## 2019-05-29 DIAGNOSIS — R109 Unspecified abdominal pain: Secondary | ICD-10-CM | POA: Insufficient documentation

## 2019-05-29 DIAGNOSIS — N912 Amenorrhea, unspecified: Secondary | ICD-10-CM

## 2019-05-29 LAB — POCT PREGNANCY, URINE: Preg Test, Ur: NEGATIVE

## 2019-05-29 NOTE — MAU Provider Note (Signed)
Taylor Roman is a 39 y.o. [phone] who present to MAU today for abdominal cramping. She has had cramping for about 2 weeks. She denies vaginal bleeding.   BP 122/74   Pulse 72   Temp 98.8 F (37.1 C)   Resp 17   Wt 102.1 kg   BMI 35.26 kg/m  CONSTITUTIONAL: Well-developed, well-nourished female in no acute distress.  CARDIOVASCULAR: Normal heart rate noted RESPIRATORY: Effort and breath sounds normal GASTROINTESTINAL:Soft, no distention noted.  No tenderness, rebound or guarding.  SKIN: Skin is warm and dry. No rash noted. Not diaphoretic. No erythema. No pallor. PSYCHIATRIC: Normal mood and affect. Normal behavior. Normal judgment and thought content.  MDM Medical screening exam complete Pregnancy test is negative  Results for orders placed or performed during the hospital encounter of 05/29/19 (from the past 24 hour(s))  Pregnancy, urine POC     Status: None   Collection Time: 05/29/19  1:52 AM  Result Value Ref Range   Preg Test, Ur NEGATIVE NEGATIVE   '  A:  Amenorrhea  P: Discharge home Patient advised that she can call for an appt at any of our [facility] or she can go to the ED for evaluation tonight. Patient given phone number for clinics and states that she will call tomorrow for an appt.   Thressa Sheller DNP, CNM  05/29/19  2:05 AM

## 2019-05-29 NOTE — MAU Note (Signed)
Abdominal pain that occurs intermittently over the past 2 weeks.  Denies VB.  Took ibuprofen this AM with minimal improvement.  LMP 05/21/2019.

## 2019-06-06 ENCOUNTER — Encounter (HOSPITAL_COMMUNITY): Payer: Self-pay | Admitting: Emergency Medicine

## 2019-06-06 ENCOUNTER — Other Ambulatory Visit: Payer: Self-pay

## 2019-06-06 ENCOUNTER — Emergency Department (HOSPITAL_COMMUNITY)
Admission: EM | Admit: 2019-06-06 | Discharge: 2019-06-06 | Disposition: A | Discharge status: Home / Self Care | Payer: Self-pay | Attending: Emergency Medicine | Admitting: Emergency Medicine

## 2019-06-06 ENCOUNTER — Emergency Department (HOSPITAL_COMMUNITY): Payer: Self-pay

## 2019-06-06 DIAGNOSIS — Y929 Unspecified place or not applicable: Secondary | ICD-10-CM | POA: Insufficient documentation

## 2019-06-06 DIAGNOSIS — X58XXXA Exposure to other specified factors, initial encounter: Secondary | ICD-10-CM | POA: Insufficient documentation

## 2019-06-06 DIAGNOSIS — Y939 Activity, unspecified: Secondary | ICD-10-CM | POA: Insufficient documentation

## 2019-06-06 DIAGNOSIS — S0300XA Dislocation of jaw, unspecified side, initial encounter: Secondary | ICD-10-CM

## 2019-06-06 DIAGNOSIS — S0302XA Dislocation of jaw, left side, initial encounter: Secondary | ICD-10-CM | POA: Insufficient documentation

## 2019-06-06 DIAGNOSIS — Y999 Unspecified external cause status: Secondary | ICD-10-CM | POA: Insufficient documentation

## 2019-06-06 MED ORDER — PROPOFOL 10 MG/ML IV BOLUS
1.0000 mg/kg | Freq: Once | INTRAVENOUS | Status: AC
  Administered 2019-06-06: 50 mg via INTRAVENOUS
  Filled 2019-06-06: qty 20

## 2019-06-06 MED ORDER — FENTANYL CITRATE (PF) 100 MCG/2ML IJ SOLN
50.0000 ug | Freq: Once | INTRAMUSCULAR | Status: AC
  Administered 2019-06-06: 50 ug via INTRAVENOUS
  Filled 2019-06-06: qty 2

## 2019-06-06 MED ORDER — NAPROXEN 500 MG PO TABS: 500.0000 mg | ORAL_TABLET | Freq: Two times a day (BID) | ORAL | 0 refills | Status: DC | PRN

## 2019-06-06 MED ORDER — PROPOFOL 10 MG/ML IV BOLUS
INTRAVENOUS | Status: AC | PRN
  Administered 2019-06-06: 50 mg via INTRAVENOUS

## 2019-06-06 NOTE — Discharge Instructions (Signed)
You were seen in the emergency department today after your jaw dislocated.  This was reduced and your x-ray looked normal.  We are sending you with naproxen to help with pain.  - Naproxen is a nonsteroidal anti-inflammatory medication that will help with pain and swelling. Be sure to take this medication as prescribed with food, 1 pill every 12 hours,  It should be taken with food, as it can cause stomach upset, and more seriously, stomach bleeding. Do not take other nonsteroidal anti-inflammatory medications with this such as Advil, Motrin, Aleve, Mobic, Goodie Powder, or Motrin.    You make take Tylenol per over the counter dosing with these medications.   We have prescribed you new medication(s) today. Discuss the medications prescribed today with your pharmacist as they can have adverse effects and interactions with your other medicines including over the counter and prescribed medications. Seek medical evaluation if you start to experience new or abnormal symptoms after taking one of these medicines, seek care immediately if you start to experience difficulty breathing, feeling of your throat closing, facial swelling, or rash as these could be indications of a more serious allergic reaction  Please follow-up with oral surgery within 3 days for reevaluation.  Return to the ER for any new or worsening symptoms or any other concerns.

## 2019-06-06 NOTE — ED Notes (Signed)
Patient given water for fluid challenge.  

## 2019-06-06 NOTE — ED Provider Notes (Addendum)
[facility] DEPT Provider Note   CSN: [id number] Arrival date & time: 06/06/19  9242     History Chief Complaint  Patient presents with  . Jaw Pain    Taylor Roman is a 39 y.o. 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  Patient states that she is having pain to the left lower jaw that is constant, worse when she tries to chew or open her mouth, no alleviating factors.  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  She states her tooth hurts on the inside as well.  She denies fever, chills, intraoral drainage, dysphagia, vomiting, or dyspnea.  HPI     Past Medical History:  Diagnosis Date  . Anemia    States has sickle cell trait, father of child without sickle cell trait  . Headache    otc med prn  . 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 Vaginal Pap smear, abnormal    10/2015 cervical conization with biopsy.  To have repeat pap in JUne 2018 at [facility]    Patient Active Problem List   Diagnosis Date Noted  . Lateral epicondylitis, left elbow 05/09/2019  . Effusion of left elbow 05/09/2019  . Seasonal allergies 04/19/2019  . Closed dislocation of left jaw 04/19/2019  . Medial epicondylitis of left elbow 12/01/2018  . Hemoglobin S-C disease ([place])   . Anemia   . Vaginal Pap smear, abnormal   . Headache   . 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 CERVICAL CONIZATION W/BX N/A 11/03/2015   Procedure: CONIZATION CERVIX WITH BIOPSY;  Surgeon: Aletha Halim, MD;  Location: [facility];  Service: Gynecology;  Laterality: N/A;  . HYSTEROSCOPY WITH D & C N/A 05/10/2017   Procedure: DILATATION AND CURETTAGE  /HYSTEROSCOPY;  Surgeon: Sloan Leiter, MD;  Location: [facility];  Service: Gynecology;  Laterality: N/A;  . NO PAST SURGERIES       OB History    Gravida  4   Para  4   Term  4   Preterm  0   AB  0   Living  3     SAB  0   TAB  0   Ectopic  0   Multiple  0   Live Births  4           Family History  Problem Relation Age of Onset  . Cancer Mother        Hepatic carcinoma?  No history of viral hepatitis she is aware of  . Diabetes Maternal Grandmother   . Anesthesia problems Neg Hx     Social History   Tobacco Use  . Smoking status: Never Smoker  . Smokeless tobacco: Never Used  Substance Use Topics  . Alcohol use: No  . Drug use: No    Home Medications Prior to Admission medications   Medication Sig Start Date End Date Taking? Authorizing Provider  cyclobenzaprine (FLEXERIL) 5 MG tablet Take 1 tablet (5 mg total) by mouth 3 (three) times daily as needed for muscle spasms. 02/20/19   Mitzi Hansen, MD  ferrous sulfate 325 (65 FE) MG tablet Take 325 mg by  mouth daily with breakfast.    [provider]  ibuprofen (ADVIL,MOTRIN) 600 MG tablet Take 1 tablet (600 mg total) by mouth every 6 (six) hours as needed. 05/10/17   Conan Bowens, MD  loratadine (CLARITIN) 10 MG tablet Take 1 tablet (10 mg total) by mouth daily. 04/19/19   Julieanne Manson, MD  methocarbamol (ROBAXIN) 500 MG tablet Take 1 tablet (500 mg total) by mouth 2 (two) times daily. 12/05/18   Hyman Hopes, Margaux, PA-C  methocarbamol (ROBAXIN) 500 MG tablet Take 1 tablet (500 mg total) by mouth 2 (two) times daily. 12/21/18   Hyman Hopes, Margaux, PA-C  naproxen (NAPROSYN) 500 MG tablet Take 1 tablet (500 mg total) by mouth 2 (two) times daily with a meal. Patient taking differently: Take 500 mg by mouth daily as needed for mild pain or moderate pain.  08/21/18   Julieanne Manson, MD  naproxen (NAPROSYN) 500 MG tablet Take 1 tablet (500 mg total) by mouth 2 (two) times daily.  12/21/18   Tanda Rockers, PA-C  predniSONE (DELTASONE) 20 MG tablet Take 1 tablet (20 mg total) by mouth daily with breakfast. 11/21/18   Julieanne Manson, MD    Allergies    Patient has no known allergies.  Review of Systems   Review of Systems  Constitutional: Negative for chills and fever.  HENT: Positive for dental problem. Negative for sore throat, trouble swallowing and voice change.        Positive for jaw pain.  Respiratory: Negative for shortness of breath.   Cardiovascular: Negative for chest pain.  Gastrointestinal: Negative for abdominal pain and vomiting.  All other systems reviewed and are negative.   Physical Exam Updated Vital Signs BP 138/83 (BP Location: Left Arm)   Pulse 64   Temp 98.8 F (37.1 C) (Oral)   Resp 16   SpO2 100%   Physical Exam Vitals and nursing note reviewed.  Constitutional:      General: She is not in acute distress.    Appearance: She is well-developed. She is not toxic-appearing.  HENT:     Head: Normocephalic and atraumatic.     Right Ear: Tympanic membrane is not perforated, erythematous, retracted or bulging.     Left Ear: Tympanic membrane is not perforated, erythematous, retracted or bulging.     Nose: Nose normal.     Mouth/Throat:     Pharynx: Uvula midline. No oropharyngeal exudate, posterior oropharyngeal erythema or uvula swelling.     Tonsils: No tonsillar abscesses.     Comments: Patient having some difficulty opening her mouth able to do so with slight limitation. Asymmetry noted, does appear to have a deformity consistent with left-sided TMJ dislocation.    No palpable gingival fluctuance.  No gross abscess.  Patient tolerating own secretions without difficulty.  No drooling. No hot potato voice. No swelling beneath the tongue, submandibular compartment is soft.   Eyes:     General:        Right eye: No discharge.        Left eye: No discharge.     Conjunctiva/sclera: Conjunctivae normal.  Cardiovascular:      Rate and Rhythm: Normal rate and regular rhythm.  Pulmonary:     Effort: Pulmonary effort is normal. No respiratory distress.     Breath sounds: Normal breath sounds. No wheezing, rhonchi or rales.  Abdominal:     General: There is no distension.     Palpations: Abdomen is soft.     Tenderness: There is no abdominal  tenderness.  Musculoskeletal:     Cervical back: Normal range of motion and neck supple.  Lymphadenopathy:     Cervical: No cervical adenopathy.  Skin:    General: Skin is warm and dry.     Findings: No rash.  Neurological:     Mental Status: She is alert.     Comments: Clear speech.   Psychiatric:        Behavior: Behavior normal.        Thought Content: Thought content normal.     ED Results / Procedures / Treatments   Labs (all labs ordered are listed, but only abnormal results are displayed) Labs Reviewed - No data to display  EKG None  Radiology CLINICAL DATA: TMJ dislocation.  EXAM: MANDIBLE - 1-3 VIEW  COMPARISON: None.  FINDINGS: Both mandibular condyles appear to be normally located. No findings suspicious for dislocation or fracture.  IMPRESSION: Normally located mandibular condyles. No findings suspicious for dislocation or fracture.   Electronically Signed By: Rudie Meyer M.D. On: 06/06/2019 11:37   Procedures Reduction of dislocation  Date/Time: 06/06/2019 10:43 AM Performed by: Cherly Anderson, PA-C Authorized by: Cherly Anderson, PA-C  Consent: Verbal consent obtained. Risks and benefits: risks, benefits and alternatives were discussed Consent given by: patient Patient understanding: patient states understanding of the procedure being performed Patient consent: the patient's understanding of the procedure matches consent given Procedure consent: procedure consent matches procedure scheduled Relevant documents: relevant documents present and verified Test results: test results available and properly  labeled Site marked: the operative site was marked Imaging studies: imaging studies available Required items: required blood products, implants, devices, and special equipment available Patient identity confirmed: verbally with patient Time out: Immediately prior to procedure a "time out" was called to verify the correct patient, procedure, equipment, support staff and site/side marked as required. Local anesthesia used: no  Anesthesia: Local anesthesia used: no  Sedation: Patient sedated: yes Sedatives: propofol Analgesia: fentanyl Sedation start date/time: 06/06/2019 10:40 AM Sedation end date/time: 06/06/2019 10:55 AM Vitals: Vital signs were monitored during sedation.  Patient tolerance: patient tolerated the procedure well with no immediate complications Comments: See Dr. Buena Irish note for sedation documentation.     (including critical care time)  Medications Ordered in ED Medications  propofol (DIPRIVAN) 10 mg/mL bolus/IV push 102.1 mg (50 mg Intravenous Given 06/06/19 1025)  fentaNYL (SUBLIMAZE) injection 50 mcg (50 mcg Intravenous Given 06/06/19 1022)  propofol (DIPRIVAN) 10 mg/mL bolus/IV push (50 mg Intravenous Given 06/06/19 1023)    ED Course  I have reviewed the triage vital signs and the nursing notes.  Pertinent labs & imaging results that were available during my care of the patient were reviewed by me and considered in my medical decision making (see chart for details).    MDM Rules/Calculators/A&P                     Patient presents to the emergency department with left-sided jaw pain that began shortly prior to arrival.  She is nontoxic, resting comfortably, vitals without significant abnormality initially.  On exam there is clinical concern for subtle left TMJ dislocation.  Nursing notes and chart have been reviewed for additional history, patient has been seen in the emergency department several times for similar.  Conscious sedation and reduction were  performed with supervising physician Dr. Stevie Kern. I ordered mandible x-ray following procedure reveals normally located mandibular condyles, no findings suspicious for dislocation or fracture, I personally reviewed and interpreted imaging.  11:57: RE-EVAL: States that her jaw feels much better, she is awake, alert, not requiring oxygen. Will ensure she is able to tolerate PO.   Patient tolerating p.o., remains with normal bite and without asymmetry.  No signs of infection on reexam.  Reassuring x-ray.  Patient overall appears appropriate for discharge home at this time. I discussed results, treatment plan, need for follow-up, and return precautions with the patient. Provided opportunity for questions, patient confirmed understanding and is in agreement with plan.   This is a shared visit with supervising physician Dr. Stevie Kern who has independently evaluated patient & provided guidance in evaluation/management/disposition, in agreement with care   Final Clinical Impression(s) / ED Diagnoses Final diagnoses:  Dislocation of temporomandibular joint, initial encounter    Rx / DC Orders ED Discharge Orders         Ordered    naproxen (NAPROSYN) 500 MG tablet  2 times daily PRN     06/06/19 1223           Cherly Anderson, PA-C 06/06/19 1225    Milagros Loll, MD 06/07/19 0750    Cherly Anderson, PA-C 06/18/19 2284    Milagros Loll, MD 06/18/19 [phone]

## 2019-06-06 NOTE — ED Triage Notes (Signed)
Pt c/o left side jaw pain for hour. Denies swelling

## 2019-06-07 NOTE — ED Provider Notes (Signed)
  [facility] EMERGENCY DEPARTMENT   PROCEDURE NOTE  Procedures .Sedation  Date/Time: 06/07/2019 7:51 AM Performed by: Milagros Loll, MD Authorized by: Milagros Loll, MD   Consent:    Consent obtained:  Verbal   Consent given by:  Patient   Risks discussed:  Allergic reaction, dysrhythmia, inadequate sedation, nausea, prolonged hypoxia resulting in organ damage, prolonged sedation necessitating reversal, respiratory compromise necessitating ventilatory assistance and intubation and vomiting   Alternatives discussed:  Analgesia without sedation, anxiolysis and regional anesthesia Universal protocol:    Procedure explained and questions answered to patient or proxy's satisfaction: yes     Relevant documents present and verified: yes     Test results available and properly labeled: yes     Imaging studies available: yes     Required blood products, implants, devices, and special equipment available: yes     Site/side marked: yes     Immediately prior to procedure a time out was called: yes     Patient identity confirmation method:  Verbally with patient Indications:    Procedure necessitating sedation performed by:  Physician performing sedation Pre-sedation assessment:    Time since last food or drink:  6 hours   ASA classification: class 1 - normal, healthy patient     Neck mobility: normal     Mouth opening:  3 or more finger widths   Thyromental distance:  4 finger widths   Mallampati score:  I - soft palate, uvula, fauces, pillars visible   Pre-sedation assessments completed and reviewed: airway patency, cardiovascular function, hydration status, mental status, nausea/vomiting, pain level, respiratory function and temperature   Immediate pre-procedure details:    Reassessment: Patient reassessed immediately prior to procedure     Reviewed: vital signs, relevant labs/tests and NPO status     Verified: bag valve mask available, emergency equipment  available, intubation equipment available, IV patency confirmed, oxygen available and suction available   Procedure details (see MAR for exact dosages):    Preoxygenation:  Nasal cannula   Sedation:  Propofol   Intended level of sedation: deep   Intra-procedure monitoring:  Blood pressure monitoring, cardiac monitor, continuous pulse oximetry, frequent LOC assessments, frequent vital sign checks and continuous capnometry   Intra-procedure events: hypoxia     Intra-procedure management:  Airway repositioning and supplemental oxygen   Total Provider sedation time (minutes):  15 Post-procedure details:    Attendance: Constant attendance by certified staff until patient recovered     Recovery: Patient returned to pre-procedure baseline     Post-sedation assessments completed and reviewed: airway patency, cardiovascular function, hydration status, mental status, nausea/vomiting, pain level, respiratory function and temperature     Patient is stable for discharge or admission: yes     Patient tolerance:  Tolerated well, no immediate complications Comments:     Brief hypoxia during sedation lasting less than one minute with drop in O2 sats to mid 80s. Responded to supplemental oxygen and repositioning.   (including critical care time)    Milagros Loll, MD 06/07/19 [phone]

## 2019-06-20 ENCOUNTER — Other Ambulatory Visit: Payer: Self-pay

## 2019-06-20 ENCOUNTER — Telehealth: Payer: Self-pay | Admitting: Orthopaedic Surgery

## 2019-06-20 DIAGNOSIS — M7712 Lateral epicondylitis, left elbow: Secondary | ICD-10-CM

## 2019-06-20 DIAGNOSIS — M25522 Pain in left elbow: Secondary | ICD-10-CM

## 2019-06-20 DIAGNOSIS — M25422 Effusion, left elbow: Secondary | ICD-10-CM

## 2019-06-20 NOTE — Telephone Encounter (Signed)
Pt called stating she was approved by her insurance for a MRI and she would like to proceed with getting scheduled.   [phone]

## 2019-06-20 NOTE — Telephone Encounter (Signed)
Do you want her to come back in to discuss the MRI as stated in the last office note?

## 2019-06-20 NOTE — Telephone Encounter (Signed)
Spoke with patient and ordered MRI. She is aware to call us to schedule follow up appointment when she knows when the MRI is.

## 2019-06-20 NOTE — Telephone Encounter (Signed)
Not necessary-believe the MRI is of the left elbow. OK to proceed if she did not receive any benefit from the injection

## 2019-07-18 ENCOUNTER — Other Ambulatory Visit: Payer: Self-pay

## 2019-07-18 ENCOUNTER — Ambulatory Visit
Admission: RE | Admit: 2019-07-18 | Discharge: 2019-07-18 | Disposition: A | Discharge status: Home / Self Care | Payer: No Typology Code available for payment source | Source: Ambulatory Visit | Attending: Orthopaedic Surgery | Admitting: Orthopaedic Surgery

## 2019-07-18 DIAGNOSIS — M25422 Effusion, left elbow: Secondary | ICD-10-CM

## 2019-07-18 DIAGNOSIS — M25522 Pain in left elbow: Secondary | ICD-10-CM

## 2019-07-19 ENCOUNTER — Other Ambulatory Visit (INDEPENDENT_AMBULATORY_CARE_PROVIDER_SITE_OTHER): Payer: Self-pay

## 2019-07-19 DIAGNOSIS — Z79899 Other long term (current) drug therapy: Secondary | ICD-10-CM

## 2019-07-19 DIAGNOSIS — Z13220 Encounter for screening for lipoid disorders: Secondary | ICD-10-CM

## 2019-07-19 DIAGNOSIS — S0302XS Dislocation of jaw, left side, sequela: Secondary | ICD-10-CM

## 2019-07-20 LAB — CBC WITH DIFFERENTIAL/PLATELET
Basophils Absolute: 0 10*3/uL (ref 0.0–0.2)
Basos: 1 %
EOS (ABSOLUTE): 0.2 10*3/uL (ref 0.0–0.4)
Eos: 3 %
Hematocrit: 28.3 % — ABNORMAL LOW (ref 34.0–46.6)
Hemoglobin: 8.7 g/dL — ABNORMAL LOW (ref 11.1–15.9)
Immature Grans (Abs): 0 10*3/uL (ref 0.0–0.1)
Immature Granulocytes: 0 %
Lymphocytes Absolute: 2.2 10*3/uL (ref 0.7–3.1)
Lymphs: 34 %
MCH: 25.7 pg — ABNORMAL LOW (ref 26.6–33.0)
MCHC: 30.7 g/dL — ABNORMAL LOW (ref 31.5–35.7)
MCV: 84 fL (ref 79–97)
Monocytes Absolute: 0.4 10*3/uL (ref 0.1–0.9)
Monocytes: 6 %
Neutrophils Absolute: 3.6 10*3/uL (ref 1.4–7.0)
Neutrophils: 56 %
Platelets: 212 10*3/uL (ref 150–450)
RBC: 3.38 x10E6/uL — ABNORMAL LOW (ref 3.77–5.28)
RDW: 20.1 % — ABNORMAL HIGH (ref 11.7–15.4)
WBC: 6.5 10*3/uL (ref 3.4–10.8)

## 2019-07-20 LAB — COMPREHENSIVE METABOLIC PANEL
ALT: 20 IU/L (ref 0–32)
AST: 20 IU/L (ref 0–40)
Albumin/Globulin Ratio: 1.3 (ref 1.2–2.2)
Albumin: 4.2 g/dL (ref 3.8–4.8)
Alkaline Phosphatase: 59 IU/L (ref 48–121)
BUN/Creatinine Ratio: 12 (ref 9–23)
BUN: 10 mg/dL (ref 6–24)
Bilirubin Total: 0.7 mg/dL (ref 0.0–1.2)
CO2: 23 mmol/L (ref 20–29)
Calcium: 9 mg/dL (ref 8.7–10.2)
Chloride: 105 mmol/L (ref 96–106)
Creatinine, Ser: 0.81 mg/dL (ref 0.57–1.00)
GFR calc Af Amer: 105 mL/min/{1.73_m2} (ref >59)
GFR calc non Af Amer: 91 mL/min/{1.73_m2} (ref >59)
Globulin, Total: 3.2 g/dL (ref 1.5–4.5)
Glucose: 88 mg/dL (ref 65–99)
Potassium: 4.3 mmol/L (ref 3.5–5.2)
Sodium: 142 mmol/L (ref 134–144)
Total Protein: 7.4 g/dL (ref 6.0–8.5)

## 2019-07-20 LAB — LIPID PANEL W/O CHOL/HDL RATIO
Cholesterol, Total: 150 mg/dL (ref 100–199)
HDL: 45 mg/dL (ref >39)
LDL Chol Calc (NIH): 76 mg/dL (ref 0–99)
Triglycerides: 171 mg/dL — ABNORMAL HIGH (ref 0–149)
VLDL Cholesterol Cal: 29 mg/dL (ref 5–40)

## 2019-07-24 ENCOUNTER — Ambulatory Visit (INDEPENDENT_AMBULATORY_CARE_PROVIDER_SITE_OTHER): Payer: Self-pay | Admitting: Orthopaedic Surgery

## 2019-07-24 ENCOUNTER — Encounter: Payer: Self-pay | Admitting: Internal Medicine

## 2019-07-24 ENCOUNTER — Encounter: Payer: Self-pay | Admitting: Orthopaedic Surgery

## 2019-07-24 ENCOUNTER — Other Ambulatory Visit: Payer: Self-pay

## 2019-07-24 VITALS — Ht 67.0 in | Wt 225.0 lb

## 2019-07-24 DIAGNOSIS — M25422 Effusion, left elbow: Secondary | ICD-10-CM

## 2019-07-24 MED ORDER — LIDOCAINE HCL 1 % IJ SOLN
2.0000 mL | INTRAMUSCULAR | Status: AC | PRN
  Administered 2019-07-24: 2 mL

## 2019-07-24 MED ORDER — METHYLPREDNISOLONE ACETATE 40 MG/ML IJ SUSP
40.0000 mg | INTRAMUSCULAR | Status: AC | PRN
Start: 2019-07-24 — End: 2019-07-24
  Administered 2019-07-24: 40 mg via INTRA_ARTICULAR

## 2019-07-24 NOTE — Progress Notes (Signed)
Office Visit Note   Patient: Taylor Roman           Date of Birth: 1980/08/03           MRN: [id number] Visit Date: 07/24/2019              Requested by: Julieanne Manson, MD [address] [place],  [place] [address] PCP: Julieanne Manson, MD   Assessment & Plan: Visit Diagnoses:  1. Effusion of left elbow     Plan: MRI scan of the left elbow demonstrate a large complex joint effusion with diffuse synovial irregularity.  No discrete loose bodies.  Effusion appears to be chronic.  Mildly thin olecranon cartilage without focal defect.  All nerves intact.  Common flexor and extensor origins were intact as was biceps tendon.  There were no acute osseous abnormalities.  I suspect the origin of her pain is intra-articular.  Will inject the left elbow joint with Depo-Medrol and monitor response.  Check her back in 3 to 4 weeks.  She is aware that she may be sore for a day or 2 and will try ice and over-the-counter medicines if needed. Not sure of the origin of her synovitis.  No other joint complaints and no history of injury or trauma  Follow-Up Instructions: Return in about 1 month (around 08/24/2019).   Orders:  Orders Placed This Encounter  Procedures   Medium Joint Inj: L elbow   No orders of the defined types were placed in this encounter.     Procedures: Medium Joint Inj: L elbow on 07/24/2019 10:38 AM Details: 25 G needle, anterolateral approach Medications: 2 mL lidocaine 1 %; 40 mg methylPREDNISolone acetate 40 MG/ML      Clinical Data: No additional findings.   Subjective: Chief Complaint  Patient presents with   Left Elbow - Pain, Follow-up    MRI review  Patient presents today for follow up on her left elbow. She had an MRI on 07/18/2019 and is here today to discuss those results.  Has had prior cortisone injection along the lateral epicondyles without much relief thus, MRI scan performed.  Results are as above.  Not much change in terms of  symptoms   Review of Systems   Objective: Vital Signs: Ht 5\' 7"  (1.702 m)    Wt 225 lb (102.1 kg)    BMI 35.24 kg/m   Physical Exam Constitutional:      Appearance: She is well-developed.  Eyes:     Pupils: Pupils are equal, round, and reactive to light.  Pulmonary:     Effort: Pulmonary effort is normal.  Skin:    General: Skin is warm and dry.  Neurological:     Mental Status: She is alert and oriented to person, place, and time.  Psychiatric:        Behavior: Behavior normal.     Ortho Exam awake alert and oriented x3.  Comfortable sitting full range of motion of left elbow in pronation supination and flexion.  Lacked about 10 degrees to full elbow extension.  Some discomfort along the lateral elbow but skin was intact.  There was some lightening of the skin from prior cortisone injection over the lateral epicondyles.  Good grip and good release.  Neurologically intact  Specialty Comments:  No specialty comments available.  Imaging: No results found.   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  To have repeat pap in JUne 2018 at [facility]    Family History  Problem Relation Age of Onset   Cancer  Mother        Hepatic carcinoma?  No history of viral hepatitis she is aware of   Diabetes Maternal Grandmother    Anesthesia problems Neg Hx     Past Surgical History:  Procedure Laterality Date   CERVICAL CONIZATION W/BX N/A 11/03/2015   Procedure: CONIZATION CERVIX WITH BIOPSY;  Surgeon:  Bing, MD;  Location: [facility];  Service: Gynecology;  Laterality: N/A;   HYSTEROSCOPY WITH D & C N/A 05/10/2017   Procedure: DILATATION AND CURETTAGE /HYSTEROSCOPY;  Surgeon: Conan Bowens, MD;  Location: [facility];  Service: Gynecology;  Laterality: N/A;   NO PAST SURGERIES     Social History   Occupational History   Occupation: Hair care  Tobacco Use   Smoking status: Never Smoker   Smokeless tobacco: Never Used  Vaping Use   Vaping Use: Never used  Substance and Sexual Activity   Alcohol use: No   Drug use: No   Sexual activity: Not Currently    Birth control/protection: None

## 2019-07-26 ENCOUNTER — Other Ambulatory Visit: Payer: Self-pay

## 2019-07-30 ENCOUNTER — Encounter: Payer: Self-pay | Admitting: Internal Medicine

## 2019-07-30 NOTE — Addendum Note (Signed)
Addended by: Marcene Duos on: 07/30/2019 09:26 AM   Modules accepted: Orders

## 2019-08-01 ENCOUNTER — Ambulatory Visit: Payer: Self-pay | Admitting: Orthopaedic Surgery

## 2019-08-07 ENCOUNTER — Other Ambulatory Visit: Payer: Self-pay

## 2019-08-07 ENCOUNTER — Emergency Department (HOSPITAL_COMMUNITY)
Admission: EM | Admit: 2019-08-07 | Discharge: 2019-08-07 | Disposition: A | Discharge status: Home / Self Care | Payer: Self-pay | Attending: Emergency Medicine | Admitting: Emergency Medicine

## 2019-08-07 ENCOUNTER — Telehealth: Payer: Self-pay | Admitting: Internal Medicine

## 2019-08-07 ENCOUNTER — Ambulatory Visit: Payer: No Typology Code available for payment source | Admitting: Orthopaedic Surgery

## 2019-08-07 ENCOUNTER — Emergency Department (HOSPITAL_COMMUNITY): Payer: Self-pay

## 2019-08-07 ENCOUNTER — Encounter (HOSPITAL_COMMUNITY): Payer: Self-pay

## 2019-08-07 DIAGNOSIS — M26622 Arthralgia of left temporomandibular joint: Secondary | ICD-10-CM | POA: Insufficient documentation

## 2019-08-07 DIAGNOSIS — S0300XS Dislocation of jaw, unspecified side, sequela: Secondary | ICD-10-CM

## 2019-08-07 MED ORDER — NAPROXEN 125 MG/5ML PO SUSP
500.0000 mg | Freq: Two times a day (BID) | ORAL | 0 refills | Status: DC
Start: 2019-08-07 — End: 2020-01-05

## 2019-08-07 MED ORDER — PROPOFOL 10 MG/ML IV BOLUS
INTRAVENOUS | Status: AC | PRN
  Administered 2019-08-07: 60 mg via INTRAVENOUS
  Administered 2019-08-07: 40 mg via INTRAVENOUS

## 2019-08-07 MED ORDER — KETOROLAC TROMETHAMINE 30 MG/ML IJ SOLN
15.0000 mg | Freq: Once | INTRAMUSCULAR | Status: AC
  Administered 2019-08-07: 15 mg via INTRAVENOUS
  Filled 2019-08-07: qty 1

## 2019-08-07 MED ORDER — PROPOFOL 10 MG/ML IV BOLUS
100.0000 mg | Freq: Once | INTRAVENOUS | Status: DC
  Filled 2019-08-07: qty 20

## 2019-08-07 NOTE — Telephone Encounter (Signed)
Dental referral faxed over to [facility] at Dental clinic through [facility]. She said will try to work patient in as soon as she can.

## 2019-08-07 NOTE — ED Notes (Signed)
Xray called

## 2019-08-07 NOTE — Discharge Instructions (Addendum)
Follow-up with the ENT doctor for your chronic jaw dislocations and pain. CT scan confirms appropriate joint placement.

## 2019-08-07 NOTE — Telephone Encounter (Signed)
Spoke with patient. Informed since she has a orange card will go through [facility] dental clinic. Informed her co-pay will be $40 cash only at time of visit. They will call patient to schedule appointment. Informed of the importance of answering numbers that you do not recognize. Patient verbalized understanding for all these things.

## 2019-08-07 NOTE — Sedation Documentation (Signed)
Attempt in

## 2019-08-07 NOTE — ED Provider Notes (Signed)
[facility]-EMERGENCY DEPT Provider Note   CSN: [id number] Arrival date & time: 08/07/19  0301     History Chief Complaint  Patient presents with  . Jaw Pain    Taylor Roman is a 39 y.o. female.  HPI     39 year old female comes in a chief complaint of jaw pain.  She has history of chronic jaw dislocations.  She is seeing an ENT doctor and reports that she is supposed to get a follow-up appointment soon and possibly a surgical repair. She dislocated her jaw around 1 AM.  She has been in the waiting room since then.  She is having difficulty closing her mouth and opening it up all the way.  Past Medical History:  Diagnosis Date  . Anemia    States has sickle cell trait, father of child without sickle cell trait  . Headache    otc med prn  . 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 Vaginal Pap smear, abnormal    10/2015 cervical conization with biopsy.  [facility]    Patient Active Problem List   Diagnosis Date Noted  . Lateral epicondylitis, left elbow 05/09/2019  . Effusion of left elbow 05/09/2019  . Seasonal allergies 04/19/2019  . Closed dislocation of left jaw 04/19/2019  . Medial epicondylitis of left elbow 12/01/2018  . Hemoglobin S-C disease (HCC)   . Anemia   . Vaginal Pap smear, abnormal   . Headache   . 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 CERVICAL CONIZATION W/BX N/A 11/03/2015   Procedure: CONIZATION CERVIX WITH BIOPSY;  Surgeon: Cove Bingharlie Pickens, MD;  Location: [facility];  Service: Gynecology;  Laterality: N/A;  . HYSTEROSCOPY WITH D & C N/A 05/10/2017   Procedure: DILATATION AND CURETTAGE /HYSTEROSCOPY;  Surgeon: Conan Bowensavis, Kelly M, MD;  Location: [facility];  Service: Gynecology;  Laterality: N/A;  . NO  PAST SURGERIES       OB History    Gravida  4   Para  4   Term  4   Preterm  0   AB  0   Living  3     SAB  0   TAB  0   Ectopic  0   Multiple  0   Live Births  4           Family History  Problem Relation Age of Onset  . Cancer Mother        Hepatic carcinoma?  No history of viral hepatitis she is aware of  . Diabetes Maternal Grandmother   . Anesthesia problems Neg Hx     Social History   Tobacco Use  . Smoking status: Never Smoker  . Smokeless tobacco: Never Used  Vaping Use  . Vaping Use: Never used  Substance Use Topics  . Alcohol use: No  . Drug use: No    Home Medications Prior to Admission medications   Medication Sig Start Date End Date Taking? Authorizing Provider  acetaminophen (TYLENOL) 500 MG tablet Take 500 mg by mouth every 6 (six) hours as needed for mild pain.   Yes [provider]  cyclobenzaprine (FLEXERIL) 5 MG tablet Take 1 tablet (5 mg total) by mouth 3 (three) times daily as needed for muscle spasms.  Patient not taking: Reported on 08/07/2019 02/20/19   Elige Radon, MD  naproxen (NAPROSYN) 125 MG/5ML suspension Take 20 mLs (500 mg total) by mouth 2 (two) times daily with a meal. 08/07/19   Derwood Kaplan, MD  loratadine (CLARITIN) 10 MG tablet Take 1 tablet (10 mg total) by mouth daily. Patient not taking: Reported on 06/06/2019 04/19/19 06/06/19  Julieanne Manson, MD    Allergies    Patient has no known allergies.  Review of Systems   Review of Systems  Constitutional: Positive for activity change.  HENT: Positive for facial swelling.   All other systems reviewed and are negative.   Physical Exam Updated Vital Signs BP 124/78   Pulse (!) 47   Temp 97.9 F (36.6 C) (Oral)   Resp 17   Ht 5\' 7"  (1.702 m)   Wt 102.1 kg   LMP 07/10/2019   SpO2 100%   BMI 35.24 kg/m   Physical Exam Vitals and nursing note reviewed.  Constitutional:      Appearance: She is well-developed.  HENT:     Head:  Normocephalic and atraumatic.     Comments: Tenderness over the left jaw. Cardiovascular:     Rate and Rhythm: Normal rate.  Pulmonary:     Effort: Pulmonary effort is normal.  Abdominal:     General: Bowel sounds are normal.  Musculoskeletal:     Cervical back: Normal range of motion and neck supple.  Skin:    General: Skin is warm and dry.  Neurological:     Mental Status: She is alert and oriented to person, place, and time.     ED Results / Procedures / Treatments   Labs (all labs ordered are listed, but only abnormal results are displayed) Labs Reviewed - No data to display  EKG None  Radiology DG Mandible 4 Views  Result Date: 08/07/2019 CLINICAL DATA:  39 year old female with concern for mandibular dislocation. EXAM: MANDIBLE - 4+ VIEW COMPARISON:  Radiograph dated 06/06/2019. FINDINGS: Evaluation is very limited due to poor visualization of the temporomandibular joints on the oblique views. On the first frontal view the mandibles appear in anatomic alignment with no evidence of dislocation. However, there is subluxed appearance of the left mandibular condyle on the last view. Clinical correlation is recommended. CT may provide additional information if there is high clinical concern for dislocated mandible. IMPRESSION: Subluxed appearance of the left mandible on one of the views. Clinical correlation is recommended. Electronically Signed   By: 08/06/2019 M.D.   On: 08/07/2019 15:14   CT Maxillofacial Wo Contrast  Result Date: 08/07/2019 CLINICAL DATA:  Status post trauma. EXAM: CT MAXILLOFACIAL WITHOUT CONTRAST TECHNIQUE: Multidetector CT imaging of the maxillofacial structures was performed. Multiplanar CT image reconstructions were also generated. COMPARISON:  February 20, 2019 FINDINGS: Osseous: No fracture or mandibular dislocation. No destructive process. Orbits: Negative. No traumatic or inflammatory finding. Sinuses: There is mild anterior right ethmoid sinus  mucosal thickening. This is unchanged in appearance when compared to the prior exam. Soft tissues: There is mild right frontal and right supra orbital soft tissue swelling. Limited intracranial: No significant or unexpected finding. IMPRESSION: 1. Mild right frontal and right supra orbital soft tissue swelling. 2. No acute displaced fracture. Electronically Signed   By: February 22, 2019 M.D.   On: 08/07/2019 16:29    Procedures Reduction of dislocation  Date/Time: 08/07/2019 4:54 PM Performed by: 10/07/2019, MD Authorized by: Derwood Kaplan, MD  Preparation: Patient was prepped and draped in the  usual sterile fashion. Local anesthesia used: no  Anesthesia: Local anesthesia used: no  Sedation: Patient sedated: yes Sedatives: propofol  Patient tolerance: patient tolerated the procedure well with no immediate complications  .Sedation  Date/Time: 08/07/2019 4:54 PM Performed by: Derwood Kaplan, MD Authorized by: Derwood Kaplan, MD   Consent:    Consent obtained:  Written   Consent given by:  Patient   Risks discussed:  Allergic reaction, dysrhythmia, inadequate sedation, nausea, prolonged hypoxia resulting in organ damage, prolonged sedation necessitating reversal, respiratory compromise necessitating ventilatory assistance and intubation and vomiting   Alternatives discussed:  Analgesia without sedation, anxiolysis and regional anesthesia Universal protocol:    Procedure explained and questions answered to patient or proxy's satisfaction: yes     Relevant documents present and verified: yes     Test results available and properly labeled: yes     Imaging studies available: yes     Required blood products, implants, devices, and special equipment available: yes     Site/side marked: yes     Immediately prior to procedure a time out was called: yes     Patient identity confirmation method:  Verbally with patient Indications:    Procedure performed:  Dislocation reduction    Procedure necessitating sedation performed by:  Physician performing sedation Pre-sedation assessment:    Time since last food or drink:  14 hours   ASA classification: class 2 - patient with mild systemic disease     Neck mobility: normal     Mouth opening:  1 finger width   Thyromental distance:  4 finger widths   Mallampati score:  Unable to assess   Pre-sedation assessments completed and reviewed: airway patency, cardiovascular function, hydration status, mental status, nausea/vomiting, pain level, respiratory function and temperature     Pre-sedation assessment completed:  08/07/2019 1:55 PM Immediate pre-procedure details:    Reassessment: Patient reassessed immediately prior to procedure     Reviewed: vital signs, relevant labs/tests and NPO status     Verified: bag valve mask available, emergency equipment available, intubation equipment available, IV patency confirmed, oxygen available and suction available   Procedure details (see MAR for exact dosages):    Preoxygenation:  Nasal cannula   Sedation:  Propofol   Intended level of sedation: deep   Intra-procedure monitoring:  Blood pressure monitoring, cardiac monitor, continuous pulse oximetry, frequent LOC assessments, frequent vital sign checks and continuous capnometry   Intra-procedure events: none     Total Provider sedation time (minutes):  25 Post-procedure details:    Post-sedation assessment completed:  08/07/2019 4:55 PM   Attendance: Constant attendance by certified staff until patient recovered     Recovery: Patient returned to pre-procedure baseline     Post-sedation assessments completed and reviewed: airway patency, cardiovascular function, hydration status, mental status, nausea/vomiting, pain level, respiratory function and temperature     Patient is stable for discharge or admission: yes     Patient tolerance:  Tolerated well, no immediate complications   (including critical care time)  Medications Ordered in  ED Medications  propofol (DIPRIVAN) 10 mg/mL bolus/IV push 100 mg (has no administration in time range)  propofol (DIPRIVAN) 10 mg/mL bolus/IV push (40 mg Intravenous Given 08/07/19 1514)    ED Course  I have reviewed the triage vital signs and the nursing notes.  Pertinent labs & imaging results that were available during my care of the patient were reviewed by me and considered in my medical decision making (see chart for details).  MDM Rules/Calculators/A&P                          39 year old comes in a chief complaint of jaw pain. She has history of multiple jaw dislocation and feels like her jaw is out again.  She is unable to close her mouth completely or open the mouth as widely as she normally can.  She is able to talk.  Unable to tolerate p.o.  X-rays equivocal.  Clinically she is having symptoms consistent with her dislocation therefore we will proceed with procedural sedation and reduction.  Reassessment: We had clear loosening up of the jaw during reduction. CT ordered.   Reassessment at 4:58 PM: Patient is feeling better.  She is stable for discharge.  She is calling her ride.  Advised that she cannot drive home.  Nursing staff made aware as well that patient cannot leave until someone picks her up. Final Clinical Impression(s) / ED Diagnoses Final diagnoses:  Arthralgia of left temporomandibular joint    Rx / DC Orders ED Discharge Orders         Ordered    naproxen (NAPROSYN) 125 MG/5ML suspension  2 times daily with meals     Discontinue  Reprint     08/07/19 1638           Derwood Kaplan, MD 08/07/19 1658

## 2019-08-07 NOTE — ED Triage Notes (Signed)
Patient arrived stating she has a history of dislocation of temporomandibular joint, reports she has pain on the left side and is not able to close her mouth completely.

## 2019-09-04 ENCOUNTER — Ambulatory Visit (INDEPENDENT_AMBULATORY_CARE_PROVIDER_SITE_OTHER): Payer: Self-pay | Admitting: Orthopaedic Surgery

## 2019-09-04 ENCOUNTER — Encounter: Payer: Self-pay | Admitting: Orthopaedic Surgery

## 2019-09-04 ENCOUNTER — Other Ambulatory Visit: Payer: Self-pay

## 2019-09-04 VITALS — Ht 67.0 in | Wt 225.0 lb

## 2019-09-04 DIAGNOSIS — M7712 Lateral epicondylitis, left elbow: Secondary | ICD-10-CM

## 2019-09-04 DIAGNOSIS — M25422 Effusion, left elbow: Secondary | ICD-10-CM

## 2019-09-04 NOTE — Progress Notes (Signed)
Office Visit Note   Patient: Taylor Roman           Date of Birth: [birth year]           MRN: [id number] Visit Date: 09/04/2019              Requested by: Julieanne Manson, MD [address],  [place] [address] PCP: Julieanne Manson, MD   Assessment & Plan: Visit Diagnoses:  1. Effusion of left elbow   2. Lateral epicondylitis, left elbow     Plan: Intra-articular cortisone injection left elbow did not provide much relief.  She is also had an injection over the lateral epicondyle prior to that which did not provide much relief.  MRI scan demonstrates 6 probable synovitis with a joint effusion.  She has had a course of physical therapy and still having some discomfort.  Today her pain is localized over the lateral epicondyles.  Have suggested Voltaren gel and a tennis elbow splint.  Will order sed rate C-reactive protein and RA factor.  No skin rashes or other joint problems.  No history of injury or trauma.  Does have a history of anemia followed by her primary care physician and taking iron.  Reevaluate in 2 weeks.  Has been through a course of physical therapy.  Follow-Up Instructions: Return in about 2 weeks (around 09/18/2019).   Orders:  No orders of the defined types were placed in this encounter.  No orders of the defined types were placed in this encounter.     Procedures: No procedures performed   Clinical Data: No additional findings.   Subjective: Chief Complaint  Patient presents with  . Left Elbow - Follow-up  Patient presents today for follow up on her left elbow. She was here 6 weeks ago and had her elbow injected with cortisone. She said that her elbow has been hurting more since the injection. She takes Ibuprofen or Naproxen for pain. She is right hand dominant.   HPI  Review of Systems   Objective: Vital Signs: Ht 5\' 7"  (1.702 m)   Wt 225 lb (102.1 kg)   BMI 35.24 kg/m   Physical Exam Constitutional:      Appearance: She is  well-developed.  Eyes:     Pupils: Pupils are equal, round, and reactive to light.  Pulmonary:     Effort: Pulmonary effort is normal.  Skin:    General: Skin is warm and dry.  Neurological:     Mental Status: She is alert and oriented to person, place, and time.  Psychiatric:        Behavior: Behavior normal.     Ortho Exam left elbow lacks about 10 degrees to full extension.  Has full pronation supination and flexion.  No crepitation.  Today her pain is localized over the lateral epicondyles.  No obvious effusion.  No pain about the olecranon or the medial epicondyles.  Good grip and good release.  No pain about the elbow or the shoulder biceps intact.  No skin rashes  Specialty Comments:  No specialty comments available.  Imaging: No results found.   PMFS History: Patient Active Problem List   Diagnosis Date Noted  . Lateral epicondylitis, left elbow 05/09/2019  . Effusion of left elbow 05/09/2019  . Seasonal allergies 04/19/2019  . Closed dislocation of left jaw 04/19/2019  . Medial epicondylitis of left elbow 12/01/2018  . Hemoglobin S-C disease (HCC)   . Anemia   . Vaginal Pap smear, abnormal   .  Headache   . 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 Anemia    States has sickle cell trait, father of child without sickle cell trait  . Headache    otc med prn  . 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 Vaginal Pap smear, abnormal    10/2015 cervical conization with biopsy.  To have repeat pap in JUne 2018 at [facility]    Family History  Problem Relation Age of Onset  . Cancer Mother        Hepatic carcinoma?  No history of viral hepatitis she is aware of  . Diabetes Maternal Grandmother   . Anesthesia problems Neg Hx     Past Surgical History:  Procedure Laterality Date  . CERVICAL  CONIZATION W/BX N/A 11/03/2015   Procedure: CONIZATION CERVIX WITH BIOPSY;  Surgeon: Fordyce Bing, MD;  Location: [facility];  Service: Gynecology;  Laterality: N/A;  . HYSTEROSCOPY WITH D & C N/A 05/10/2017   Procedure: DILATATION AND CURETTAGE /HYSTEROSCOPY;  Surgeon: Conan Bowens, MD;  Location: [facility];  Service: Gynecology;  Laterality: N/A;  . NO PAST SURGERIES     Social History   Occupational History  . Occupation: Hair care  Tobacco Use  . Smoking status: Never Smoker  . Smokeless tobacco: Never Used  Vaping Use  . Vaping Use: Never used  Substance and Sexual Activity  . Alcohol use: No  . Drug use: No  . Sexual activity: Not Currently    Birth control/protection: None

## 2019-09-04 NOTE — Addendum Note (Signed)
Addended by: Wendi Maya on: 09/04/2019 11:31 AM   Modules accepted: Orders

## 2019-09-05 LAB — SEDIMENTATION RATE: Sed Rate: 6 mm/h (ref 0–20)

## 2019-09-05 LAB — RHEUMATOID FACTOR: Rheumatoid fact SerPl-aCnc: <14 IU/mL (ref <14)

## 2019-09-05 LAB — C-REACTIVE PROTEIN: CRP: 5.6 mg/L (ref <8.0)

## 2019-09-25 ENCOUNTER — Ambulatory Visit: Payer: Self-pay | Admitting: Internal Medicine

## 2019-09-25 ENCOUNTER — Encounter: Payer: Self-pay | Admitting: Internal Medicine

## 2019-09-25 ENCOUNTER — Other Ambulatory Visit: Payer: Self-pay | Admitting: Internal Medicine

## 2019-09-25 VITALS — BP 128/76 | HR 66 | Resp 12 | Ht 67.0 in | Wt 223.0 lb

## 2019-09-25 DIAGNOSIS — H547 Unspecified visual loss: Secondary | ICD-10-CM | POA: Insufficient documentation

## 2019-09-25 DIAGNOSIS — Z0000 Encounter for general adult medical examination without abnormal findings: Secondary | ICD-10-CM

## 2019-09-25 DIAGNOSIS — M2292 Unspecified disorder of patella, left knee: Secondary | ICD-10-CM

## 2019-09-25 DIAGNOSIS — Z23 Encounter for immunization: Secondary | ICD-10-CM

## 2019-09-25 DIAGNOSIS — Z124 Encounter for screening for malignant neoplasm of cervix: Secondary | ICD-10-CM

## 2019-09-25 DIAGNOSIS — Z1231 Encounter for screening mammogram for malignant neoplasm of breast: Secondary | ICD-10-CM

## 2019-09-25 DIAGNOSIS — D509 Iron deficiency anemia, unspecified: Secondary | ICD-10-CM

## 2019-09-25 DIAGNOSIS — M7710 Lateral epicondylitis, unspecified elbow: Secondary | ICD-10-CM | POA: Insufficient documentation

## 2019-09-25 DIAGNOSIS — R002 Palpitations: Secondary | ICD-10-CM

## 2019-09-25 DIAGNOSIS — Z1159 Encounter for screening for other viral diseases: Secondary | ICD-10-CM

## 2019-09-25 DIAGNOSIS — M7712 Lateral epicondylitis, left elbow: Secondary | ICD-10-CM

## 2019-09-25 LAB — POCT WET PREP WITH KOH
Clue Cells Wet Prep HPF POC: NEGATIVE
KOH Prep POC: NEGATIVE
RBC Wet Prep HPF POC: NEGATIVE
Trichomonas, UA: NEGATIVE
Yeast Wet Prep HPF POC: NEGATIVE

## 2019-09-25 NOTE — Progress Notes (Signed)
Subjective:    Patient ID: Taylor Roman, female   DOB: [birth year], 39 y.o.   MRN: [id number]   HPI   CPE with pap  1.  Pap:  Last pap was 2018 and normal.    2.  Mammogram:  States had a mammogram possibly in 2018, but do not see a result for a mammogram.  No family history of breast cancer.  G4P4.  She breast fed all 4 children.    3.  Osteoprevention:  Not much in way of dairy.  Walks regularly--but cannot get a good idea of how often.    4.  Guaiac Cards: Never.  5.  Colonoscopy:  Never.  No family history of colon cancer.  6.  Immunizations:  Immunization History  Administered Date(s) Administered   Influenza Inj Mdck Quad Pf 11/21/2018   Influenza Split 10/06/2011   Influenza,inj,Quad PF,6+ Mos 01/15/2015   Tdap 10/20/2011, 03/23/2015      7.  Glucose/Cholesterol:  Blood glucose in July normal.  Cholesterol at goal in July except for triglycerides, but was nonfasting.  Lipid Panel     Component Value Date/Time   CHOL 150 07/19/2019 0921   TRIG 171 (H) 07/19/2019 0921   HDL 45 07/19/2019 0921   LDLCALC 76 07/19/2019 0921   LABVLDL 29 07/19/2019 0921   Also:    8.  Anemia:  Has been taking iron.  Her periods are heavy.  9.  Recurrent Left TMJ dislocation:  Has been to Dental clinic.  Waiting to hear from specialist as was told would occur.  Had xrays and CT of maxillofacial   10.  Left lateral epicondylitis/ Elbow effusion:  Following with Dr. Cleophas Dunker, Ortho  Current Meds  Medication Sig   acetaminophen (TYLENOL) 500 MG tablet Take 500 mg by mouth every 6 (six) hours as needed for mild pain.   cyclobenzaprine (FLEXERIL) 5 MG tablet Take 1 tablet (5 mg total) by mouth 3 (three) times daily as needed for muscle spasms.   ferrous sulfate 325 (65 FE) MG tablet Take 325 mg by mouth daily with breakfast.   naproxen (NAPROSYN) 125 MG/5ML suspension Take 20 mLs (500 mg total) by mouth 2 (two) times daily with a meal.   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  To have repeat pap in JUne 2018 at [facility]    Past Surgical History:  Procedure Laterality Date   CERVICAL CONIZATION W/BX N/A 11/03/2015   Procedure: CONIZATION CERVIX WITH BIOPSY;  Surgeon: [place] Bing, MD;  Location: [facility];  Service: Gynecology;  Laterality: N/A;   HYSTEROSCOPY WITH D & C N/A 05/10/2017   Procedure: DILATATION AND CURETTAGE /HYSTEROSCOPY;  Surgeon: Conan Bowens, MD;  Location: [facility];  Service: Gynecology;  Laterality: N/A;    Family History  Problem Relation Age of Onset   Cancer Mother        Hepatic carcinoma?  No history of viral hepatitis she is aware of   Diabetes Maternal Grandmother    Anesthesia problems Neg Hx     Social History   Socioeconomic History   Marital status: Media planner  Spouse name: [facility]   Number of children: 3   Years of education: 12   Highest education level: Not on file  Occupational History   Occupation: Hair care  Tobacco Use   Smoking status: Never Smoker   Smokeless tobacco: Never Used  Vaping Use   Vaping Use: Never used  Substance and Sexual Activity   Alcohol use: No   Drug use: No   Sexual activity: Not Currently    Birth control/protection: None  Other Topics Concern   Not on file  Social History Narrative   Originally from [place]   Speaks [place], [place] and [place]   Came to [employer]. In 2009   Lives with her 3 young sons.   Father of children is in [place].    [facility], her boyfriend now lives with the family as well.   Social Determinants of Health   Financial Resource Strain:    Difficulty of Paying Living Expenses: Not on file  Food Insecurity: No Food Insecurity   Worried About Community education officer in the Last Year: Never true   Ran Out of Food in the Last Year: Never true  Transportation Needs: No Transportation Needs   Lack of Transportation (Medical): No   Lack of Transportation (Non-Medical): No  Physical Activity:    Days of Exercise per Week: Not on file   Minutes of Exercise per Session: Not on file  Stress:    Feeling of Stress : Not on file  Social Connections:    Frequency of Communication with Friends and Family: Not on file   Frequency of Social Gatherings with Friends and Family: Not on file   Attends Religious Services: Not on file   Active Member of Clubs or Organizations: Not on file   Attends Banker Meetings: Not on file   Marital Status: Not on file  Intimate Partner Violence: Not At Risk   Fear of Current or Ex-Partner: No   Emotionally Abused: No   Physically Abused: No   Sexually Abused: No      Review of Systems  Constitutional: Negative for appetite change and fatigue.  HENT: Negative for dental problem and sore throat.        Still with recurrent jaw dislocation.  Eyes: Positive for visual disturbance (decreased vision.).  Respiratory: Negative for cough and shortness of breath.   Cardiovascular: Positive for palpitations (Occasional.  Lasts 5 minutes.  Feels tired.). Negative for chest pain and leg swelling.  Gastrointestinal: Negative for abdominal pain, blood in stool (No melena), constipation and diarrhea.  Genitourinary: Negative for dysuria, frequency and vaginal discharge.  Musculoskeletal:       Left elbow  Neurological: Negative for weakness and numbness.  Psychiatric/Behavioral: Negative for dysphoric mood. The patient is not nervous/anxious.       Objective:   BP 128/76 (BP Location: Left Arm, Patient Position: Sitting, Cuff Size: Large)   Pulse 66   Resp 12   Ht 5\' 7"  (1.702 m)   Wt 223 lb (101.2 kg)   LMP 09/06/2019   BMI 34.93 kg/m   Physical Exam Constitutional:      Appearance: She is  obese.  HENT:     Head: Normocephalic and atraumatic.     Right Ear: Hearing, tympanic membrane, ear canal and external ear normal.     Left Ear: Hearing, tympanic membrane, ear canal and external ear normal.     Nose: Nose normal.     Mouth/Throat:     Mouth: Mucous  membranes are moist.     Pharynx: Oropharynx is clear.  Eyes:     Extraocular Movements: Extraocular movements intact.     Conjunctiva/sclera: Conjunctivae normal.     Pupils: Pupils are equal, round, and reactive to light.     Comments: Discs sharp bilaterally  Neck:     Thyroid: No thyroid mass or thyromegaly.  Cardiovascular:     Rate and Rhythm: Normal rate and regular rhythm.     Heart sounds: S1 normal and S2 normal. No murmur heard.  No friction rub. No S3 or S4 sounds.      Comments: No carotid bruits.  Carotid, radial, femoral, DP and PT pulses normal and equal.  Pulmonary:     Effort: Pulmonary effort is normal.     Breath sounds: Normal breath sounds.  Chest:     Breasts:        Right: No swelling, inverted nipple, mass, nipple discharge or skin change.        Left: No swelling, inverted nipple, mass, nipple discharge or skin change.  Abdominal:     General: Bowel sounds are normal.     Palpations: Abdomen is soft. There is no hepatomegaly, splenomegaly or mass.     Tenderness: There is no abdominal tenderness.     Hernia: No hernia is present.  Genitourinary:    Comments: Normal external female genitalia Scant white discharge No vaginal or cervical mucosal lesion No uterine or adnexal mass or tenderness. Musculoskeletal:        General: Normal range of motion.     Cervical back: Normal range of motion and neck supple.  Lymphadenopathy:     Head:     Right side of head: No submental or submandibular adenopathy.     Left side of head: No submental or submandibular adenopathy.     Cervical: No cervical adenopathy.     Upper Body:     Right upper body: No supraclavicular or axillary adenopathy.      Left upper body: No supraclavicular or axillary adenopathy.     Lower Body: No right inguinal adenopathy. No left inguinal adenopathy.  Skin:    General: Skin is warm.     Capillary Refill: Capillary refill takes less than 2 seconds.  Neurological:     Mental Status: She is alert and oriented to person, place, and time.     Cranial Nerves: Cranial nerves are intact.     Sensory: Sensation is intact.     Motor: Motor function is intact.     Coordination: Coordination is intact.     Gait: Gait is intact.     Deep Tendon Reflexes: Reflexes are normal and symmetric.  Psychiatric:        Attention and Perception: Attention and perception normal.        Mood and Affect: Mood normal.        Speech: Speech normal.        Behavior: Behavior normal.        Thought Content: Thought content normal.        Cognition and Memory: Cognition normal.        Judgment: Judgment normal.      Assessment & Plan   1.  CPE with pap Guaiac cards to return in 2 weeks Mammogram ordered Hepatitis C screening.   2.  Anemia:  CBC.  Continue iron for now.  Review of chart also shows patient with history of Hgb [place] disease.  Does not appear she has  ever been seen here by Hematology.     3.  COVID vaccination with Moderna.  Will return in 4 weeks for her second vaccine.  4.  Left patellar subluxation by history (brings this up at end)--quad strenthening exercises discussed.  5.  Decreased visual acuity:  Eye referral to [facility].  6.  Occasional palpitations:  ECG normal.  TSH, CMP, CBC.

## 2019-09-26 ENCOUNTER — Telehealth: Payer: Self-pay | Admitting: Internal Medicine

## 2019-09-26 LAB — CBC WITH DIFFERENTIAL/PLATELET
Basophils Absolute: 0.1 10*3/uL (ref 0.0–0.2)
Basos: 1 %
EOS (ABSOLUTE): 0.3 10*3/uL (ref 0.0–0.4)
Eos: 4 %
Hematocrit: 30.9 % — ABNORMAL LOW (ref 34.0–46.6)
Hemoglobin: 9.9 g/dL — ABNORMAL LOW (ref 11.1–15.9)
Immature Grans (Abs): 0 10*3/uL (ref 0.0–0.1)
Immature Granulocytes: 0 %
Lymphocytes Absolute: 2.7 10*3/uL (ref 0.7–3.1)
Lymphs: 34 %
MCH: 26.1 pg — ABNORMAL LOW (ref 26.6–33.0)
MCHC: 32 g/dL (ref 31.5–35.7)
MCV: 82 fL (ref 79–97)
Monocytes Absolute: 0.4 10*3/uL (ref 0.1–0.9)
Monocytes: 5 %
Neutrophils Absolute: 4.4 10*3/uL (ref 1.4–7.0)
Neutrophils: 56 %
Platelets: 269 10*3/uL (ref 150–450)
RBC: 3.79 x10E6/uL (ref 3.77–5.28)
RDW: 20.3 % — ABNORMAL HIGH (ref 11.7–15.4)
WBC: 7.9 10*3/uL (ref 3.4–10.8)

## 2019-09-26 LAB — TSH: TSH: 1.43 u[IU]/mL (ref 0.450–4.500)

## 2019-09-26 LAB — HEPATITIS C ANTIBODY: Hep C Virus Ab: <0.1 s/co ratio (ref 0.0–0.9)

## 2019-09-26 NOTE — Telephone Encounter (Signed)
Spoke with Lelon Mast at [facility] clinic to obtain update on patient's referral for TMJ issues. Per [facility] referral for oral surgery was [facility] to [facility] back on 08/15/2019. Lelon Mast stated Oral Surgery office called pt. 3x with no response or call back from patient. They told Samatha after three times trying to reach out to patient with no response they move up to the next patient. Lelon Mast stated they only have one provider who takes the [facility] patients and is better for patient to call sooner than later. Lelon Mast stated spoke with Oral Surgery to let them know patient will be calling to set up appointment.  [facility] phone number is [phone].  Called patient to share information with no answer- left a voicemail to return call.

## 2019-09-26 NOTE — Telephone Encounter (Signed)
Spoke with with patient. States she does not remember getting a call from anyone she did not know. Gave patient phone number for her to check her phone and see if she had any missed calls from that number. Patient states she will call back after she goes thru her phone.

## 2019-09-30 LAB — CYTOLOGY - PAP

## 2019-10-02 IMAGING — CT CT ANGIO CHEST
3 of 7 series · 19 of 36 positions shown · IV contrast (iopamidol)
Comparison: CT chest 05/18/2016. PA and lateral chest earlier
today.

CLINICAL DATA: Chest pain and palpitations for 1 year. Symptoms
have worsened over the past week with onset of shortness of breath
and weakness.

EXAM:
CT ANGIOGRAPHY CHEST WITH CONTRAST
TECHNIQUE: Multidetector CT imaging of the chest was performed using the
standard protocol during bolus administration of intravenous
contrast. Multiplanar CT image reconstructions and MIPs were
obtained to evaluate the vascular anatomy.
CONTRAST:  100 mL ANBTSZ-U8G IOPAMIDOL (ANBTSZ-U8G) INJECTION 76%

[Series 6: pe thins · axial · 0.83mm/px · z∈[+1117,+1351]mm · 15 of 382 slices shown]
[im 24/382  lung]
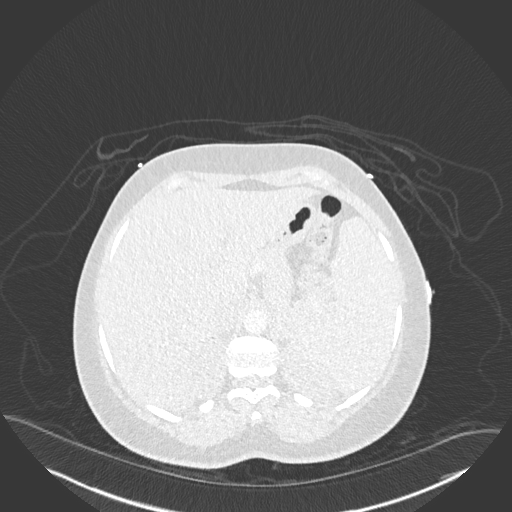
[im 48/382  mediastinal]
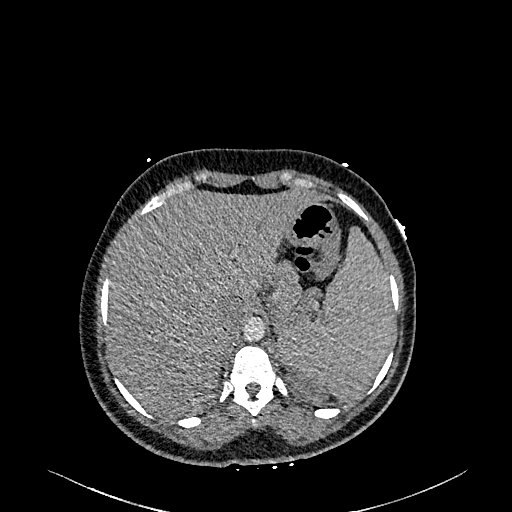
[im 72/382  lung]
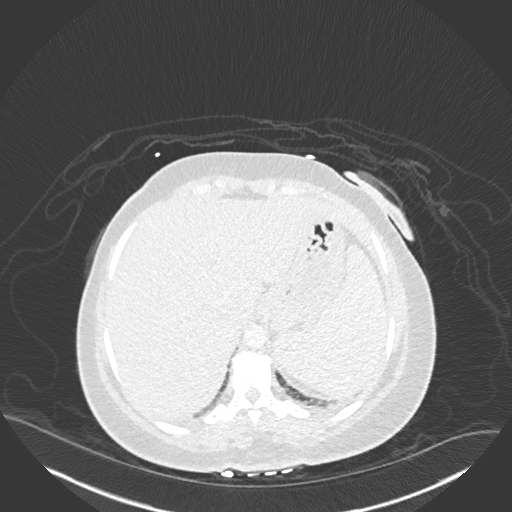
[im 96/382  mediastinal]
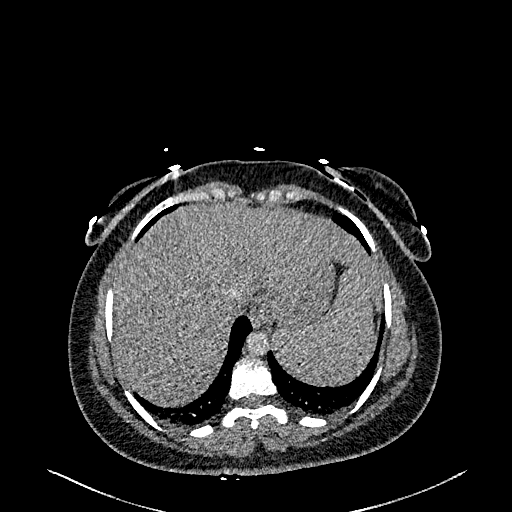
[im 120/382  lung]
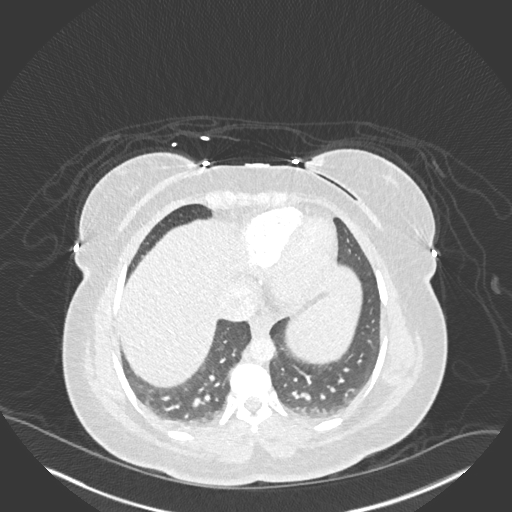
[im 143/382  mediastinal]
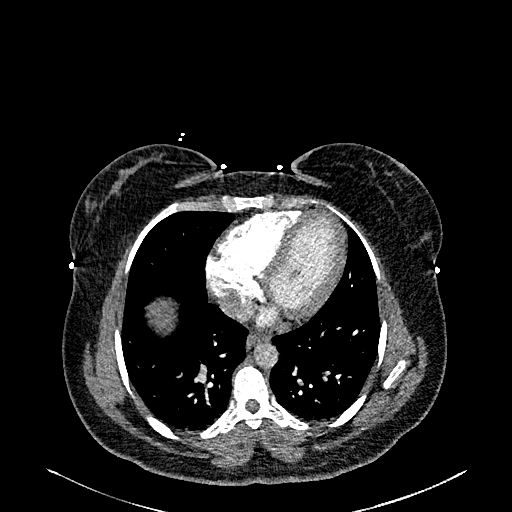
[im 167/382  lung]
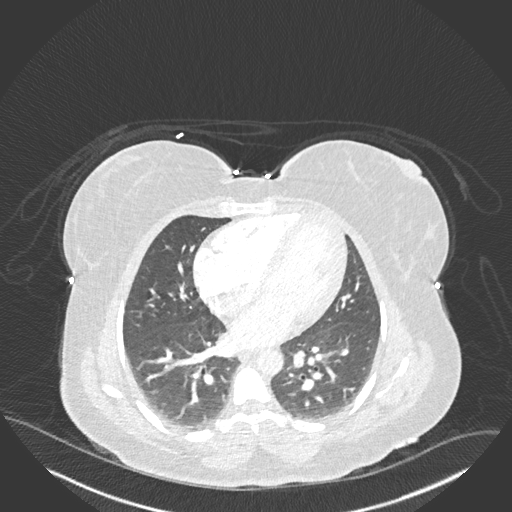
[im 191/382  mediastinal]
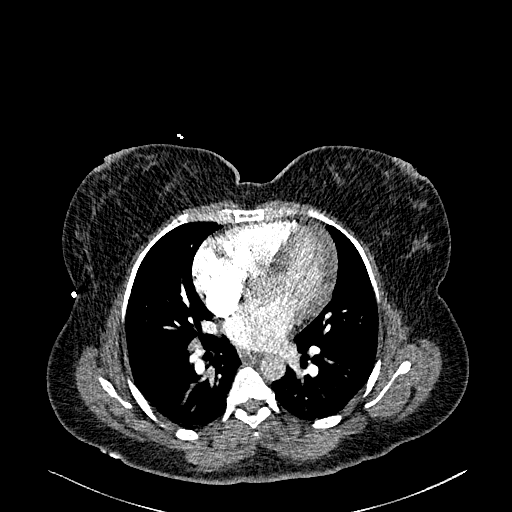
[im 215/382  lung]
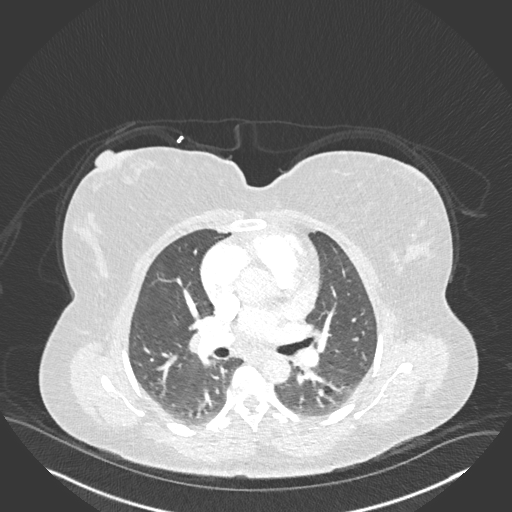
[im 239/382  mediastinal]
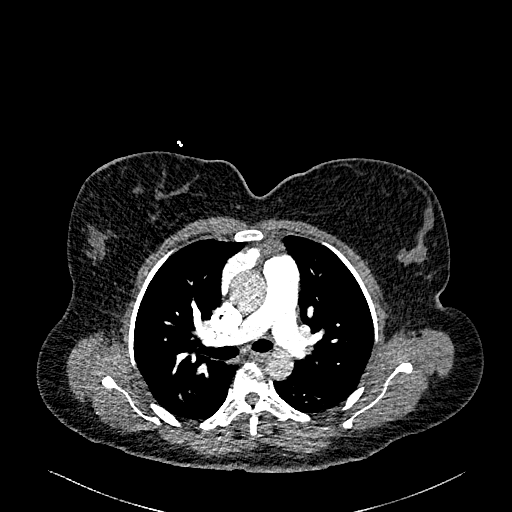
[im 262/382  lung]
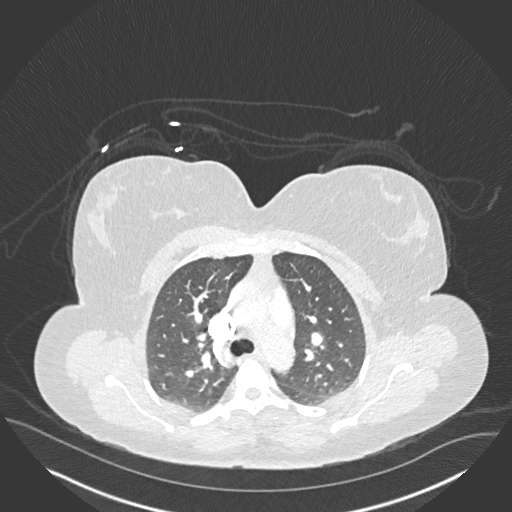
[im 286/382  mediastinal]
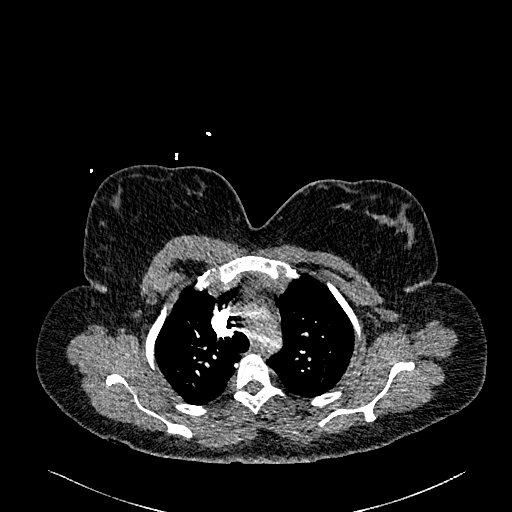
[im 310/382  lung]
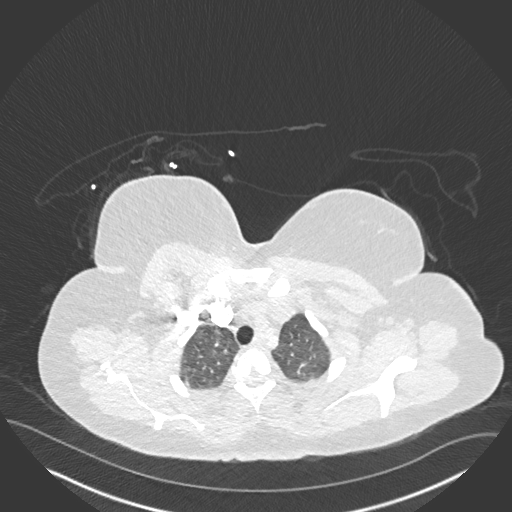
[im 334/382  mediastinal]
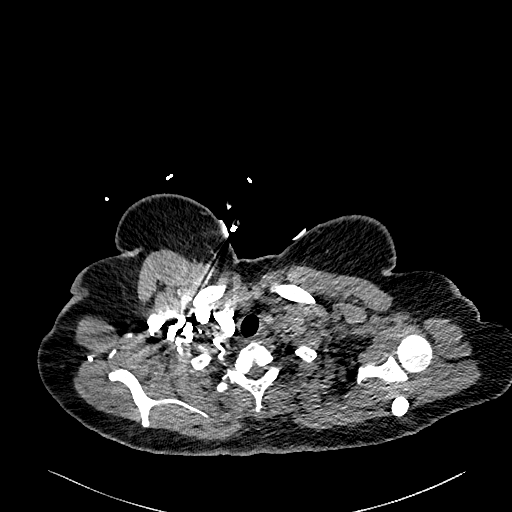
[im 358/382  lung]
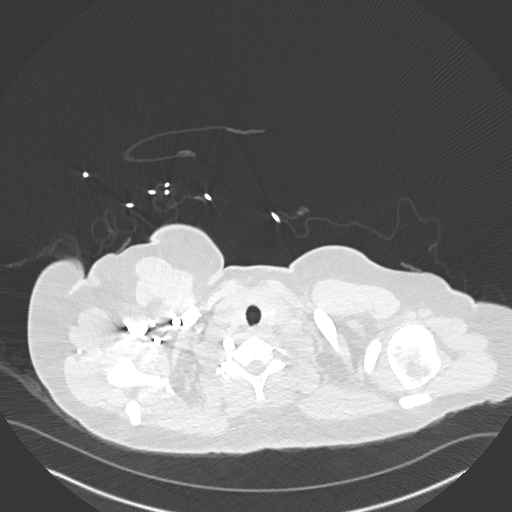

[Series 7: pe lung · axial · 0.62mm/px · z∈[+1184,+1292]mm · 3 of 110 slices shown]
[im 28/110  mediastinal]
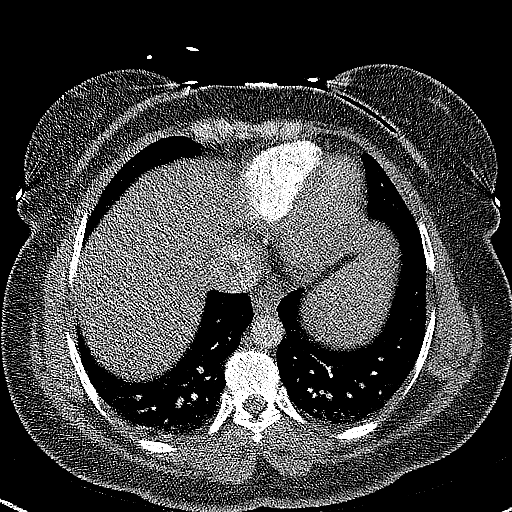
[im 55/110  mediastinal]
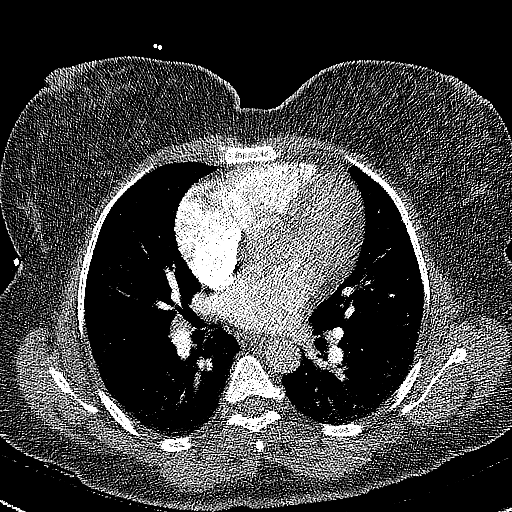
[im 82/110  mediastinal]
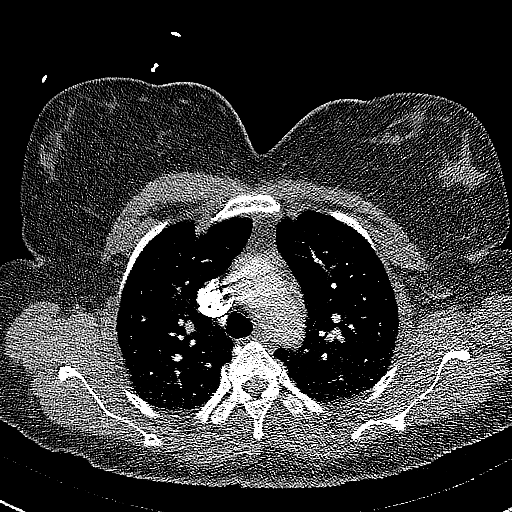

[Series 8: pe 2mm cor · coronal · 0.59mm/px · 1 of 149 slices shown]
[im 75/149  mediastinal]
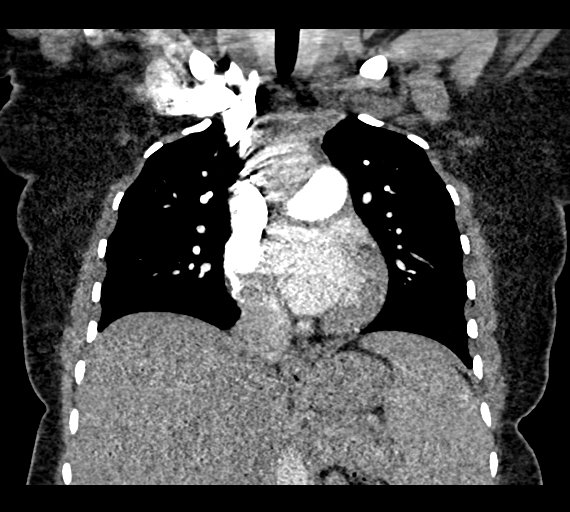

[19 of 36 positions shown; findings below may reference images not displayed]

FINDINGS: Cardiovascular: Satisfactory opacification of the pulmonary arteries
to the segmental level. No evidence of pulmonary embolism. Normal
heart size. No pericardial effusion.

Mediastinum/Nodes: No enlarged mediastinal, hilar, or axillary lymph
nodes. Thyroid gland, trachea, and esophagus demonstrate no
significant findings.

Lungs/Pleura: No pleural effusion. Mild dependent atelectasis noted.
Lungs otherwise clear.

Upper Abdomen: Negative.

Musculoskeletal: No acute or focal abnormality.

Review of the MIP images confirms the above findings.
IMPRESSION: Negative for pulmonary embolus.  Negative chest CT.

## 2019-10-07 ENCOUNTER — Other Ambulatory Visit: Payer: Self-pay

## 2019-10-08 ENCOUNTER — Emergency Department (HOSPITAL_COMMUNITY)
Admission: EM | Admit: 2019-10-08 | Discharge: 2019-10-08 | Disposition: A | Discharge status: Home / Self Care | Payer: Self-pay | Attending: Emergency Medicine | Admitting: Emergency Medicine

## 2019-10-08 ENCOUNTER — Encounter (HOSPITAL_COMMUNITY): Payer: Self-pay | Admitting: Emergency Medicine

## 2019-10-08 ENCOUNTER — Other Ambulatory Visit: Payer: Self-pay

## 2019-10-08 DIAGNOSIS — R519 Headache, unspecified: Secondary | ICD-10-CM | POA: Insufficient documentation

## 2019-10-08 DIAGNOSIS — Z20822 Contact with and (suspected) exposure to covid-19: Secondary | ICD-10-CM | POA: Insufficient documentation

## 2019-10-08 LAB — RESPIRATORY PANEL BY RT PCR (FLU A&B, COVID)
Influenza A by PCR: NEGATIVE
Influenza B by PCR: NEGATIVE
SARS Coronavirus 2 by RT PCR: NEGATIVE

## 2019-10-08 MED ORDER — ACETAMINOPHEN 500 MG PO TABS
1000.0000 mg | ORAL_TABLET | Freq: Once | ORAL | Status: AC
  Administered 2019-10-08: 1000 mg via ORAL
  Filled 2019-10-08: qty 2

## 2019-10-08 NOTE — Discharge Instructions (Addendum)
Your COVID test is negative. We recommend tylenol or ibuprofen for headache management. Follow up with your primary care doctor if symptoms persist.

## 2019-10-08 NOTE — ED Provider Notes (Signed)
[facility] EMERGENCY DEPARTMENT Provider Note   CSN: [id number] Arrival date & time: 10/08/19  1910     History Chief Complaint  Patient presents with  . Covid Exposure    Taylor Roman is a 39 y.o. female.  39 year old female presents to the emergency department with concern for Covid exposure.  She states that she was exposed to a Covid positive patient 2 days ago.  She awoke this morning with runny nose and headache.  Symptoms have been constant, unchanged. She has not taken any medications for her symptoms.  Denies fever, sore throat, cough, chest pain, shortness of breath, vomiting or diarrhea.  The history is provided by the patient. No language interpreter was used.       Past Medical History:  Diagnosis Date  . Anemia    States has sickle cell trait, father of child without sickle cell trait  . Headache    otc med prn  . 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 Lateral epicondylitis   . Vaginal Pap smear, abnormal    10/2015 cervical conization with biopsy.  To have repeat pap in JUne 2018 at [facility]    Patient Active Problem List   Diagnosis Date Noted  . Decreased visual acuity 09/25/2019  . Lateral epicondylitis   . Lateral epicondylitis, left elbow 05/09/2019  . Effusion of left elbow 05/09/2019  . Seasonal allergies 04/19/2019  . Closed dislocation of left jaw 04/19/2019  . Medial epicondylitis of left elbow 12/01/2018  . Hemoglobin S-C disease (HCC)   . Anemia   . Vaginal Pap smear, abnormal   . Headache   . 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 CERVICAL CONIZATION W/BX N/A 11/03/2015   Procedure: CONIZATION CERVIX WITH BIOPSY;  Surgeon: [place] Bing, MD;  Location: [facility];  Service: Gynecology;  Laterality: N/A;  . HYSTEROSCOPY WITH D & C  N/A 05/10/2017   Procedure: DILATATION AND CURETTAGE /HYSTEROSCOPY;  Surgeon: Conan Bowens, MD;  Location: [facility];  Service: Gynecology;  Laterality: N/A;     OB History    Gravida  4   Para  4   Term  4   Preterm  0   AB  0   Living  3     SAB  0   TAB  0   Ectopic  0   Multiple  0   Live Births  4           Family History  Problem Relation Age of Onset  . Cancer Mother        Hepatic carcinoma?  No history of viral hepatitis she is aware of  . Diabetes Maternal Grandmother   . Anesthesia problems Neg Hx     Social History   Tobacco Use  . Smoking status: Never Smoker  . Smokeless tobacco: Never Used  Vaping Use  . Vaping Use: Never used  Substance Use Topics  . Alcohol use: No  . Drug use: No    Home Medications Prior to Admission medications   Medication Sig Start Date End Date Taking? Authorizing Provider  acetaminophen (TYLENOL) 500 MG tablet Take 500 mg by mouth every 6 (six) hours as needed for mild pain.    [provider]  cyclobenzaprine (FLEXERIL) 5 MG tablet Take 1  tablet (5 mg total) by mouth 3 (three) times daily as needed for muscle spasms. 02/20/19   Elige Radon, MD  ferrous sulfate 325 (65 FE) MG tablet Take 325 mg by mouth daily with breakfast.    [provider]  naproxen (NAPROSYN) 125 MG/5ML suspension Take 20 mLs (500 mg total) by mouth 2 (two) times daily with a meal. 08/07/19   Derwood Kaplan, MD  loratadine (CLARITIN) 10 MG tablet Take 1 tablet (10 mg total) by mouth daily. Patient not taking: Reported on 06/06/2019 04/19/19 06/06/19  Julieanne Manson, MD    Allergies    Patient has no known allergies.  Review of Systems   Review of Systems  Ten systems reviewed and are negative for acute change, except as noted in the HPI.    Physical Exam Updated Vital Signs BP 111/70 (BP Location: Right Arm)   Pulse 64   Temp 98.4 F (36.9 C) (Oral)   Resp 16   SpO2 100%   Physical  Exam Vitals and nursing note reviewed.  Constitutional:      General: She is not in acute distress.    Appearance: She is well-developed. She is not diaphoretic.     Comments: Nontoxic appearing and in NAD  HENT:     Head: Normocephalic and atraumatic.  Eyes:     General: No scleral icterus.    Conjunctiva/sclera: Conjunctivae normal.  Pulmonary:     Effort: Pulmonary effort is normal. No respiratory distress.     Comments: Respirations even and unlabored Musculoskeletal:        General: Normal range of motion.     Cervical back: Normal range of motion.  Skin:    General: Skin is warm and dry.     Coloration: Skin is not pale.     Findings: No erythema or rash.  Neurological:     Mental Status: She is alert and oriented to person, place, and time.     Coordination: Coordination normal.     Comments: GCS 15. Speech is clear, goal oriented. No obvious focal deficits on assessment.   Psychiatric:        Behavior: Behavior normal.     ED Results / Procedures / Treatments   Labs (all labs ordered are listed, but only abnormal results are displayed) Labs Reviewed  RESPIRATORY PANEL BY RT PCR (FLU A&B, COVID)    EKG None  Radiology No results found.  Procedures Procedures (including critical care time)  Medications Ordered in ED Medications  acetaminophen (TYLENOL) tablet 1,000 mg (1,000 mg Oral Given 10/08/19 2311)    ED Course  I have reviewed the triage vital signs and the nursing notes.  Pertinent labs & imaging results that were available during my care of the patient were reviewed by me and considered in my medical decision making (see chart for details).    MDM Rules/Calculators/A&P                          39 year old with complaints of rhinorrhea and a headache.  Concern for Covid exposure 2 days ago.  Her Covid test in the ED is negative.  She is afebrile with stable vital signs.  Question seasonal allergies with sinus headache.  Have advised continued  use of Tylenol and primary care follow-up.  Return precautions discussed and provided. Patient discharged in stable condition with no unaddressed concerns.  Taylor Roman was evaluated in Emergency Department on 10/08/2019 for the symptoms described in the  history of present illness. She was evaluated in the context of the global COVID-19 pandemic, which necessitated consideration that the patient might be at risk for infection with the SARS-CoV-2 virus that causes COVID-19. Institutional protocols and algorithms that pertain to the evaluation of patients at risk for COVID-19 are in a state of rapid change based on information released by regulatory bodies including the CDC and federal and state organizations. These policies and algorithms were followed during the patient's care in the ED.   Final Clinical Impression(s) / ED Diagnoses Final diagnoses:  Exposure to confirmed case of COVID-19  Bad headache    Rx / DC Orders ED Discharge Orders    None       Antony Madura, PA-C 10/08/19 2346    Wynetta Fines, MD 10/11/19 [phone]

## 2019-10-08 NOTE — ED Triage Notes (Signed)
Pt presents to ED POV. Pt c/o runny nose and headache. Wants to be COVID swabbed, pt exposed Sunday. NAD

## 2019-10-16 ENCOUNTER — Ambulatory Visit: Payer: Self-pay | Admitting: Orthopaedic Surgery

## 2019-10-30 ENCOUNTER — Other Ambulatory Visit: Payer: Self-pay

## 2019-10-30 ENCOUNTER — Ambulatory Visit (INDEPENDENT_AMBULATORY_CARE_PROVIDER_SITE_OTHER): Payer: Self-pay | Admitting: Orthopaedic Surgery

## 2019-10-30 ENCOUNTER — Ambulatory Visit: Payer: Self-pay

## 2019-10-30 ENCOUNTER — Encounter: Payer: Self-pay | Admitting: Orthopaedic Surgery

## 2019-10-30 VITALS — Ht 67.0 in | Wt 223.0 lb

## 2019-10-30 DIAGNOSIS — M25422 Effusion, left elbow: Secondary | ICD-10-CM

## 2019-10-30 NOTE — Progress Notes (Signed)
Office Visit Note   Patient: Taylor Roman           Date of Birth: December 15, 1980           MRN: [id number] Visit Date: 10/30/2019              Requested by: Julieanne Manson, MD [address] [place],  [place] [address] PCP: Julieanne Manson, MD   Assessment & Plan: Visit Diagnoses:  1. Effusion of left elbow     Plan: Mrs.Dragos has evidence of synovitis and a left elbow effusion by MRI scan.  To date she has not responded to injection over the lateral epicondyles or an intra-articular cortisone injection.  CBC demonstrates anemia that has been chronic and which is being treated by her primary care physician.  Sed rate was 6 and RA factor was normal.  She still lacks a little extension probably related to the effusion.  There is no evidence of any other  internal derangement in the left elbow.i.e. arthritis or loose material.  I am going to ask Dr. Prince Rome to ultrasound the elbow and aspirate any fluid and inject the joint.  Plan to see her back in 2 weeks.  Future thoughts include formal rheumatologic evaluation.  Also might want to consider elbow arthroscopy. Follow-Up Instructions: Return in about 2 weeks (around 11/13/2019).   Orders:  Orders Placed This Encounter  Procedures  . US Guided Needle Placement   No orders of the defined types were placed in this encounter.     Procedures: No procedures performed   Clinical Data: No additional findings.   Subjective: Chief Complaint  Patient presents with  . Left Elbow - Follow-up, Pain  Patient presents today for follow up on her left elbow pain. No changes since her last visit. She is taking naproxen and states that it helps.  HPI  Review of Systems   Objective: Vital Signs: Ht 5\' 7"  (1.702 m)   Wt 223 lb (101.2 kg)   BMI 34.93 kg/m   Physical Exam Constitutional:      Appearance: She is well-developed.  Eyes:     Pupils: Pupils are equal, round, and reactive to light.  Pulmonary:     Effort: Pulmonary  effort is normal.  Skin:    General: Skin is warm and dry.  Neurological:     Mental Status: She is alert and oriented to person, place, and time.  Psychiatric:        Behavior: Behavior normal.     Ortho Exam awake alert and oriented x3.  Comfortable sitting.  No acute distress.  Full pronation supination and flexion left elbow.  Lacks a few degrees to full extension.  No localized tenderness over either epicondyles.  No obvious effusion.  Skin intact.  Good grip and good release.  No instability  Specialty Comments:  No specialty comments available.  Imaging: No results found.   PMFS History: Patient Active Problem List   Diagnosis Date Noted  . Decreased visual acuity 09/25/2019  . Lateral epicondylitis   . Lateral epicondylitis, left elbow 05/09/2019  . Effusion of left elbow 05/09/2019  . Seasonal allergies 04/19/2019  . Closed dislocation of left jaw 04/19/2019  . Medial epicondylitis of left elbow 12/01/2018  . Hemoglobin S-C disease (HCC)   . Anemia   . Vaginal Pap smear, abnormal   . Headache   . 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 Sickle cell-hemoglobin C disease (HCC)  07/13/2011   Past Medical History:  Diagnosis Date  . Anemia    States has sickle cell trait, father of child without sickle cell trait  . Headache    otc med prn  . 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 Lateral epicondylitis   . Vaginal Pap smear, abnormal    10/2015 cervical conization with biopsy.  To have repeat pap in JUne 2018 at [facility]    Family History  Problem Relation Age of Onset  . Cancer Mother        Hepatic carcinoma?  No history of viral hepatitis she is aware of  . Diabetes Maternal Grandmother   . Anesthesia problems Neg Hx     Past Surgical History:  Procedure Laterality Date  . CERVICAL CONIZATION W/BX N/A 11/03/2015   Procedure: CONIZATION CERVIX WITH BIOPSY;  Surgeon: [place] Bing, MD;  Location: [facility];  Service: Gynecology;  Laterality: N/A;  . HYSTEROSCOPY WITH D & C N/A 05/10/2017   Procedure: DILATATION AND CURETTAGE /HYSTEROSCOPY;  Surgeon: Conan Bowens, MD;  Location: [facility];  Service: Gynecology;  Laterality: N/A;   Social History   Occupational History  . Occupation: Hair care  Tobacco Use  . Smoking status: Never Smoker  . Smokeless tobacco: Never Used  Vaping Use  . Vaping Use: Never used  Substance and Sexual Activity  . Alcohol use: No  . Drug use: No  . Sexual activity: Not Currently    Birth control/protection: None

## 2019-10-31 ENCOUNTER — Encounter: Payer: Self-pay | Admitting: Orthopaedic Surgery

## 2019-10-31 ENCOUNTER — Telehealth: Payer: Self-pay | Admitting: Family Medicine

## 2019-10-31 LAB — SYNOVIAL FLUID ANALYSIS, COMPLETE

## 2019-10-31 NOTE — Telephone Encounter (Signed)
No crystals seen in the elbow fluid.

## 2019-10-31 NOTE — Telephone Encounter (Signed)
Patient is returning call to Terri. Patient states she can not get MyChart to work right. Please call patient back at [phone].

## 2019-10-31 NOTE — Telephone Encounter (Signed)
Called and left voicemail, advising patient that a message has been sent to her through MyChart from Dr. Prince Rome. She can respond to him through MyChart, or call us back - whichever is more convenient for her.

## 2019-10-31 NOTE — Telephone Encounter (Signed)
The patient had called back, saying she could not get into MyChart (in a separate message). I called and got her voice mail again - left the results in the message, advising her to call us back if no better or especially if pain worsens.

## 2019-10-31 NOTE — Telephone Encounter (Signed)
Duplicate - see other message from today.

## 2019-12-17 ENCOUNTER — Other Ambulatory Visit: Payer: Self-pay | Admitting: Internal Medicine

## 2019-12-17 ENCOUNTER — Other Ambulatory Visit: Payer: Self-pay

## 2019-12-17 DIAGNOSIS — D509 Iron deficiency anemia, unspecified: Secondary | ICD-10-CM

## 2019-12-17 NOTE — Progress Notes (Signed)
Here for CBC for follow up anemia

## 2019-12-18 LAB — CBC WITH DIFFERENTIAL/PLATELET
Basophils Absolute: 0.1 10*3/uL (ref 0.0–0.2)
Basos: 1 %
EOS (ABSOLUTE): 0.3 10*3/uL (ref 0.0–0.4)
Eos: 4 %
Hematocrit: 29.2 % — ABNORMAL LOW (ref 34.0–46.6)
Hemoglobin: 9.6 g/dL — ABNORMAL LOW (ref 11.1–15.9)
Immature Grans (Abs): 0 10*3/uL (ref 0.0–0.1)
Immature Granulocytes: 0 %
Lymphocytes Absolute: 2.1 10*3/uL (ref 0.7–3.1)
Lymphs: 36 %
MCH: 26.3 pg — ABNORMAL LOW (ref 26.6–33.0)
MCHC: 32.9 g/dL (ref 31.5–35.7)
MCV: 80 fL (ref 79–97)
Monocytes Absolute: 0.4 10*3/uL (ref 0.1–0.9)
Monocytes: 7 %
Neutrophils Absolute: 3 10*3/uL (ref 1.4–7.0)
Neutrophils: 52 %
Platelets: 225 10*3/uL (ref 150–450)
RBC: 3.65 x10E6/uL — ABNORMAL LOW (ref 3.77–5.28)
RDW: 20 % — ABNORMAL HIGH (ref 11.7–15.4)
WBC: 5.9 10*3/uL (ref 3.4–10.8)

## 2020-01-05 ENCOUNTER — Emergency Department (HOSPITAL_COMMUNITY)
Admission: EM | Admit: 2020-01-05 | Discharge: 2020-01-05 | Disposition: A | Discharge status: Home / Self Care | Payer: Self-pay | Attending: Emergency Medicine | Admitting: Emergency Medicine

## 2020-01-05 ENCOUNTER — Emergency Department (HOSPITAL_COMMUNITY): Payer: Self-pay

## 2020-01-05 DIAGNOSIS — W19XXXA Unspecified fall, initial encounter: Secondary | ICD-10-CM

## 2020-01-05 DIAGNOSIS — S8392XA Sprain of unspecified site of left knee, initial encounter: Secondary | ICD-10-CM | POA: Insufficient documentation

## 2020-01-05 DIAGNOSIS — X501XXA Overexertion from prolonged static or awkward postures, initial encounter: Secondary | ICD-10-CM | POA: Insufficient documentation

## 2020-01-05 LAB — I-STAT BETA HCG BLOOD, ED (MC, WL, AP ONLY): I-stat hCG, quantitative: <5 m[IU]/mL (ref <5)

## 2020-01-05 MED ORDER — OXYCODONE-ACETAMINOPHEN 5-325 MG PO TABS
1.0000 | ORAL_TABLET | Freq: Once | ORAL | Status: AC
  Administered 2020-01-05: 1 via ORAL
  Filled 2020-01-05: qty 1

## 2020-01-05 MED ORDER — IBUPROFEN 800 MG PO TABS
800.0000 mg | ORAL_TABLET | Freq: Four times a day (QID) | ORAL | 0 refills | Status: DC | PRN
Start: 2020-01-05 — End: 2023-06-13

## 2020-01-05 NOTE — ED Notes (Signed)
Ortho Tech paged for crutches/immobilizer

## 2020-01-05 NOTE — ED Triage Notes (Signed)
Pt fell today on her left knee and it is swelling ans painful to walk and put pressure on it.

## 2020-01-05 NOTE — ED Provider Notes (Signed)
[facility] EMERGENCY DEPARTMENT Provider Note   CSN: [id number] Arrival date & time: 01/05/20  0152     History Chief Complaint  Patient presents with  . Knee Pain    Taylor Roman is a 40 y.o. female.  The history is provided by the patient.       Past Medical History:  Diagnosis Date  . Anemia    States has sickle cell trait, father of child without sickle cell trait  . Headache    otc med prn  . 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 Lateral epicondylitis   . Vaginal Pap smear, abnormal    10/2015 cervical conization with biopsy.  To have repeat pap in JUne 2018 at [facility]    Patient Active Problem List   Diagnosis Date Noted  . Decreased visual acuity 09/25/2019  . Lateral epicondylitis   . Lateral epicondylitis, left elbow 05/09/2019  . Effusion of left elbow 05/09/2019  . Seasonal allergies 04/19/2019  . Closed dislocation of left jaw 04/19/2019  . Medial epicondylitis of left elbow 12/01/2018  . Hemoglobin S-C disease (HCC)   . Anemia   . Vaginal Pap smear, abnormal   . Headache   . 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 CERVICAL CONIZATION W/BX N/A 11/03/2015   Procedure: CONIZATION CERVIX WITH BIOPSY;  Surgeon: [place] Bing, MD;  Location: [facility];  Service: Gynecology;  Laterality: N/A;  . HYSTEROSCOPY WITH D & C N/A 05/10/2017   Procedure: DILATATION AND CURETTAGE /HYSTEROSCOPY;  Surgeon: Conan Bowens, MD;  Location: [facility];  Service: Gynecology;  Laterality: N/A;     OB History    Gravida  4   Para  4   Term  4   Preterm  0   AB  0   Living  3     SAB  0   IAB  0   Ectopic  0   Multiple  0   Live Births  4           Family History  Problem Relation Age of Onset  . Cancer Mother         Hepatic carcinoma?  No history of viral hepatitis she is aware of  . Diabetes Maternal Grandmother   . Anesthesia problems Neg Hx     Social History   Tobacco Use  . Smoking status: Never Smoker  . Smokeless tobacco: Never Used  Vaping Use  . Vaping Use: Never used  Substance Use Topics  . Alcohol use: No  . Drug use: No    Home Medications Prior to Admission medications   Medication Sig Start Date End Date Taking? Authorizing Provider  acetaminophen (TYLENOL) 500 MG tablet Take 500 mg by mouth every 6 (six) hours as needed for mild pain.    [provider]  cyclobenzaprine (FLEXERIL) 5 MG tablet Take 1 tablet (5 mg total) by mouth 3 (three) times daily as needed for muscle spasms. 02/20/19   Elige Radon, MD  ferrous sulfate 325 (65 FE) MG tablet Take 325 mg by mouth daily with breakfast.    [provider]  naproxen (NAPROSYN) 125 MG/5ML suspension Take 20 mLs (500 mg total) by mouth 2 (two) times daily with a meal. 08/07/19   Derwood Kaplan, MD  loratadine (  CLARITIN) 10 MG tablet Take 1 tablet (10 mg total) by mouth daily. Patient not taking: Reported on 06/06/2019 04/19/19 06/06/19  Julieanne Manson, MD    Allergies    Patient has no known allergies.  Review of Systems   Review of Systems  Musculoskeletal: Positive for arthralgias.  Neurological: Negative.     Physical Exam Updated Vital Signs BP 119/74 (BP Location: Right Arm)   Pulse 71   Temp 98.6 F (37 C) (Oral)   Resp 20   LMP 12/24/2019   SpO2 100%   Physical Exam Vitals and nursing note reviewed.  Constitutional:      Appearance: Normal appearance.  HENT:     Head: Atraumatic.  Pulmonary:     Effort: Pulmonary effort is normal.  Musculoskeletal:     Left knee: Swelling present. No deformity, effusion, erythema, ecchymosis, lacerations or crepitus. Decreased range of motion. Tenderness (diffuse) present. No LCL laxity, MCL laxity, ACL laxity or PCL laxity.Normal alignment and normal  patellar mobility.     Instability Tests: Anterior drawer test negative. Posterior drawer test negative.  Skin:    Findings: No bruising.  Neurological:     Mental Status: She is alert.     Cranial Nerves: No cranial nerve deficit.     Sensory: Sensation is intact.     ED Results / Procedures / Treatments   Labs (all labs ordered are listed, but only abnormal results are displayed) Labs Reviewed  I-STAT BETA HCG BLOOD, ED ([facility], [facility], [facility] ONLY)    EKG None  Radiology DG Knee Complete 4 Views Left  Result Date: 01/05/2020 CLINICAL DATA:  Fall, left knee pain EXAM: LEFT KNEE - COMPLETE 4+ VIEW COMPARISON:  None. FINDINGS: Four view radiograph left knee demonstrates normal alignment. No fracture or dislocation. There is mild tricompartmental degenerative arthritis with joint space narrowing and osteophyte formation. Small left knee effusion. Soft tissues are otherwise unremarkable. IMPRESSION: Small left knee effusion.  No fracture or dislocation. Electronically Signed   By: Helyn Numbers MD   On: 01/05/2020 03:30    Procedures Procedures (including critical care time)  Medications Ordered in ED Medications - No data to display  ED Course  I have reviewed the triage vital signs and the nursing notes.  Pertinent labs & imaging results that were available during my care of the patient were reviewed by me and considered in my medical decision making (see chart for details).    MDM Rules/Calculators/A&P                          Fall and twisting of left knee, pain with ambulation. Xray negative.  Knee immobilizer, crutches, analgesia - follow up with ortho if not improved in 3-5 days  Final Clinical Impression(s) / ED Diagnoses Final diagnoses:  Sprain of left knee, unspecified ligament, initial encounter    Rx / DC Orders ED Discharge Orders    None       Gilda Crease, MD 01/05/20 [phone]

## 2020-01-05 NOTE — Progress Notes (Signed)
Orthopedic Tech Progress Note Patient Details:  Taylor Roman 1980-04-22 [id number]  Ortho Devices Type of Ortho Device: Crutches,Knee Immobilizer Ortho Device/Splint Location: LLE Ortho Device/Splint Interventions: Adjustment,Application,Ordered   Post Interventions Patient Tolerated: Well Instructions Provided: Adjustment of device   Taylor Roman 01/05/2020, 7:33 AM

## 2020-01-05 NOTE — ED Notes (Signed)
Patient verbalizes understanding of discharge instructions. Opportunity for questioning and answers were provided. Armband removed by staff, pt discharged from ED via wheelchair to lobby to wait for ride to go home with friend.

## 2020-04-27 ENCOUNTER — Other Ambulatory Visit: Payer: Self-pay

## 2020-04-27 ENCOUNTER — Other Ambulatory Visit: Payer: Self-pay | Admitting: Internal Medicine

## 2020-04-27 DIAGNOSIS — D5720 Sickle-cell/Hb-C disease without crisis: Secondary | ICD-10-CM

## 2020-04-27 DIAGNOSIS — D509 Iron deficiency anemia, unspecified: Secondary | ICD-10-CM

## 2020-04-27 NOTE — Progress Notes (Addendum)
Taking iron twice daily.  Not really feeling any more energy. Found in her record, years 2013 and 2017 she had diagnosis of Hgb [place] disease.  Does get pain in chest and abdomen.   She does stay hydrated. Periods are not heavy, though she does have heavy cramping. Not clear she was ever referred to sickle cell agency here locally. Checking with [facility] and [facility].  Spoke with Junie Panning, ED for [facility] and [facility].  Will send contact info for them as well as referral to Dr. Nigel Berthold at [facility] (recommended by Ms. Norcott)

## 2020-04-28 LAB — CBC WITH DIFFERENTIAL/PLATELET
Basophils Absolute: 0.1 10*3/uL (ref 0.0–0.2)
Basos: 1 %
EOS (ABSOLUTE): 0.3 10*3/uL (ref 0.0–0.4)
Eos: 6 %
Hematocrit: 28.4 % — ABNORMAL LOW (ref 34.0–46.6)
Hemoglobin: 9 g/dL — ABNORMAL LOW (ref 11.1–15.9)
Immature Grans (Abs): 0 10*3/uL (ref 0.0–0.1)
Immature Granulocytes: 0 %
Lymphocytes Absolute: 2.4 10*3/uL (ref 0.7–3.1)
Lymphs: 45 %
MCH: 25.9 pg — ABNORMAL LOW (ref 26.6–33.0)
MCHC: 31.7 g/dL (ref 31.5–35.7)
MCV: 82 fL (ref 79–97)
Monocytes Absolute: 0.3 10*3/uL (ref 0.1–0.9)
Monocytes: 5 %
Neutrophils Absolute: 2.4 10*3/uL (ref 1.4–7.0)
Neutrophils: 43 %
Platelets: 208 10*3/uL (ref 150–450)
RBC: 3.48 x10E6/uL — ABNORMAL LOW (ref 3.77–5.28)
RDW: 19.2 % — ABNORMAL HIGH (ref 11.7–15.4)
WBC: 5.5 10*3/uL (ref 3.4–10.8)

## 2020-04-30 ENCOUNTER — Telehealth: Payer: Self-pay | Admitting: Internal Medicine

## 2020-04-30 NOTE — Telephone Encounter (Signed)
Patient called in regards of a referral you talk to her about. Patient stated that you asked her to call back if she didn't hear from our office about the referral. Patient do not remember exactly what the referral was about but remember the name of [facility].

## 2020-05-01 NOTE — Telephone Encounter (Signed)
I spoke with their ED last week, but have played email tag with her and will try to make that contact again.

## 2020-05-04 NOTE — Telephone Encounter (Signed)
Patient was notified.

## 2020-06-10 ENCOUNTER — Telehealth: Payer: Self-pay | Admitting: Internal Medicine

## 2020-06-10 NOTE — Telephone Encounter (Signed)
Spoke with patient and notified her of Doctor recommendations and  referral to [facility].

## 2020-06-10 NOTE — Addendum Note (Signed)
Addended by: Marcene Duos on: 06/10/2020 02:10 PM   Modules accepted: Orders

## 2020-06-10 NOTE — Addendum Note (Signed)
Addended by: Marcene Duos on: 06/10/2020 01:17 PM   Modules accepted: Orders

## 2020-07-18 ENCOUNTER — Emergency Department (HOSPITAL_COMMUNITY): Payer: Self-pay

## 2020-07-18 ENCOUNTER — Encounter (HOSPITAL_COMMUNITY): Payer: Self-pay | Admitting: Emergency Medicine

## 2020-07-18 ENCOUNTER — Other Ambulatory Visit: Payer: Self-pay

## 2020-07-18 ENCOUNTER — Emergency Department (HOSPITAL_COMMUNITY)
Admission: EM | Admit: 2020-07-18 | Discharge: 2020-07-18 | Disposition: A | Discharge status: Home / Self Care | Payer: Self-pay | Attending: Emergency Medicine | Admitting: Emergency Medicine

## 2020-07-18 DIAGNOSIS — R1084 Generalized abdominal pain: Secondary | ICD-10-CM | POA: Insufficient documentation

## 2020-07-18 DIAGNOSIS — R197 Diarrhea, unspecified: Secondary | ICD-10-CM | POA: Insufficient documentation

## 2020-07-18 LAB — COMPREHENSIVE METABOLIC PANEL
ALT: 16 U/L (ref 0–44)
AST: 21 U/L (ref 15–41)
Albumin: 3.8 g/dL (ref 3.5–5.0)
Alkaline Phosphatase: 47 U/L (ref 38–126)
Anion gap: 8 (ref 5–15)
BUN: 8 mg/dL (ref 6–20)
CO2: 24 mmol/L (ref 22–32)
Calcium: 9.6 mg/dL (ref 8.9–10.3)
Chloride: 106 mmol/L (ref 98–111)
Creatinine, Ser: 0.67 mg/dL (ref 0.44–1.00)
GFR, Estimated: >60 mL/min (ref >60)
Glucose, Bld: 98 mg/dL (ref 70–99)
Potassium: 4.3 mmol/L (ref 3.5–5.1)
Sodium: 138 mmol/L (ref 135–145)
Total Bilirubin: 0.9 mg/dL (ref 0.3–1.2)
Total Protein: 7.2 g/dL (ref 6.5–8.1)

## 2020-07-18 LAB — CBC
HCT: 24.6 % — ABNORMAL LOW (ref 36.0–46.0)
Hemoglobin: 8.7 g/dL — ABNORMAL LOW (ref 12.0–15.0)
MCH: 26.8 pg (ref 26.0–34.0)
MCHC: 35.4 g/dL (ref 30.0–36.0)
MCV: 75.7 fL — ABNORMAL LOW (ref 80.0–100.0)
Platelets: 187 10*3/uL (ref 150–400)
RBC: 3.25 MIL/uL — ABNORMAL LOW (ref 3.87–5.11)
RDW: 17.7 % — ABNORMAL HIGH (ref 11.5–15.5)
WBC: 7.2 10*3/uL (ref 4.0–10.5)
nRBC: 0 % (ref 0.0–0.2)

## 2020-07-18 LAB — URINALYSIS, ROUTINE W REFLEX MICROSCOPIC
Bilirubin Urine: NEGATIVE
Glucose, UA: NEGATIVE mg/dL
Hgb urine dipstick: NEGATIVE
Ketones, ur: NEGATIVE mg/dL
Leukocytes,Ua: NEGATIVE
Nitrite: NEGATIVE
Protein, ur: NEGATIVE mg/dL
Specific Gravity, Urine: 1.006 (ref 1.005–1.030)
pH: 7 (ref 5.0–8.0)

## 2020-07-18 LAB — I-STAT BETA HCG BLOOD, ED (MC, WL, AP ONLY): I-stat hCG, quantitative: <5 m[IU]/mL (ref <5)

## 2020-07-18 LAB — LIPASE, BLOOD: Lipase: 29 U/L (ref 11–51)

## 2020-07-18 MED ORDER — SODIUM CHLORIDE 0.9 % IV BOLUS
1000.0000 mL | Freq: Once | INTRAVENOUS | Status: AC
  Administered 2020-07-18: 1000 mL via INTRAVENOUS

## 2020-07-18 MED ORDER — ONDANSETRON HCL 4 MG/2ML IJ SOLN
4.0000 mg | Freq: Once | INTRAMUSCULAR | Status: AC
  Administered 2020-07-18: 4 mg via INTRAVENOUS
  Filled 2020-07-18: qty 2

## 2020-07-18 MED ORDER — DICYCLOMINE HCL 20 MG PO TABS: 20.0000 mg | ORAL_TABLET | Freq: Two times a day (BID) | ORAL | 0 refills | Status: DC

## 2020-07-18 MED ORDER — IOHEXOL 300 MG/ML  SOLN
100.0000 mL | Freq: Once | INTRAMUSCULAR | Status: AC | PRN
  Administered 2020-07-18: 100 mL via INTRAVENOUS

## 2020-07-18 MED ORDER — DICYCLOMINE HCL 10 MG PO CAPS
10.0000 mg | ORAL_CAPSULE | Freq: Once | ORAL | Status: AC
  Administered 2020-07-18: 10 mg via ORAL
  Filled 2020-07-18: qty 1

## 2020-07-18 NOTE — ED Triage Notes (Signed)
Patient reports LUQ abdominal pain radiating to mid back with diarrhea onset Monday this week , no emesis or fever .

## 2020-07-18 NOTE — Discharge Instructions (Addendum)
Take Bentyl as prescribed.  Trial bland diet, advance slowly if tolerated.  Follow-up with your primary care provider if symptoms persist.  Return to the emergency room for fevers, worsening or concerning symptoms.

## 2020-07-18 NOTE — ED Provider Notes (Signed)
[facility] EMERGENCY DEPARTMENT Provider Note   CSN: [id number] Arrival date & time: 07/18/20  0548     History Chief Complaint  Patient presents with   Abdominal Pain    Taylor Roman is a 40 y.o. female.  40 year old female with past medical history of sickle cell trait presents with complaint of left upper quadrant abdominal pain with diarrhea onset Monday (5 days ago).  Pain is cramping in nature, rates to generalized abdomen and back.  Reports having about 3 loose, nonbloody not mucousy stools daily with nausea, no vomiting, no fevers.  No prior abdominal surgeries.  No other complaints or concerns today.      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    Past Surgical History:  Procedure Laterality Date   CERVICAL CONIZATION W/BX N/A 11/03/2015   Procedure: CONIZATION CERVIX WITH BIOPSY;  Surgeon: [place] Bing, MD;  Location: [facility];  Service: Gynecology;  Laterality: N/A;   HYSTEROSCOPY WITH D & C N/A 05/10/2017   Procedure: DILATATION AND CURETTAGE /HYSTEROSCOPY;  Surgeon: Conan Bowens, MD;  Location: [facility];  Service: Gynecology;  Laterality: N/A;     OB History     Gravida  4   Para  4   Term  4   Preterm  0   AB  0   Living  3      SAB  0   IAB  0   Ectopic  0   Multiple  0   Live Births  4           Family History  Problem Relation Age of Onset   Cancer Mother        Hepatic carcinoma?  No history of viral hepatitis she is aware of   Diabetes Maternal Grandmother    Anesthesia problems Neg Hx     Social History   Tobacco Use   Smoking status: Never   Smokeless tobacco: Never  Vaping Use   Vaping Use: Never used  Substance Use Topics   Alcohol use: No   Drug use: No    Home Medications Prior to Admission medications   Medication Sig Start Date End Date Taking? Authorizing Provider  dicyclomine (BENTYL) 20 MG tablet Take 1 tablet (20 mg total) by mouth 2 (two) times daily. 07/18/20  Yes Jeannie Fend, PA-C  acetaminophen (TYLENOL) 500 MG tablet Take 500 mg by mouth every 6 (six) hours as needed for mild pain.  [provider]  cyclobenzaprine (FLEXERIL) 5 MG tablet Take 1 tablet (5 mg total) by mouth 3 (three) times daily as needed for muscle spasms. 02/20/19   Elige Radon, MD  ferrous sulfate 325 (65 FE) MG tablet Take 325 mg by mouth daily with breakfast.    [provider]  ibuprofen (ADVIL) 800 MG tablet Take 1 tablet (800 mg total) by mouth every 6 (six) hours as needed for moderate pain. 01/05/20   Gilda Crease, MD  loratadine (CLARITIN) 10 MG tablet Take 1 tablet (10 mg total) by mouth daily. Patient not taking: Reported on 06/06/2019 04/19/19 06/06/19  Julieanne Manson, MD    Allergies    Patient has no known allergies.  Review of Systems   Review of Systems  Constitutional:  Negative for chills and fever.  Respiratory:  Negative for shortness of breath.   Cardiovascular:  Negative for chest pain.  Gastrointestinal:  Positive for abdominal pain and diarrhea.  Negative for blood in stool, constipation and vomiting.  Musculoskeletal:  Negative for arthralgias and myalgias.  Skin:  Negative for rash and wound.  Neurological:  Negative for weakness.  Hematological:  Negative for adenopathy.  Psychiatric/Behavioral:  Negative for confusion.   All other systems reviewed and are negative.  Physical Exam Updated Vital Signs BP 125/76   Pulse (!) 55   Temp 98.2 F (36.8 C) (Oral)   Resp 18   LMP 06/22/2020   SpO2 100%   Physical Exam Vitals and nursing note reviewed.  Constitutional:      General: She is not in acute distress.    Appearance: She is well-developed. She is not diaphoretic.  HENT:     Head: Normocephalic and atraumatic.  Cardiovascular:     Rate and Rhythm: Normal rate and regular rhythm.     Heart sounds: Normal heart sounds.  Pulmonary:     Effort: Pulmonary effort is normal.     Breath sounds: Normal breath sounds.  Abdominal:     Palpations: Abdomen is soft.     Tenderness: There is generalized abdominal tenderness. There is no right CVA tenderness or left CVA tenderness.  Skin:    General: Skin is warm and dry.  Neurological:     Mental Status: She is alert and oriented to person, place, and time.  Psychiatric:        Behavior: Behavior normal.    ED Results / Procedures / Treatments   Labs (all labs ordered are listed, but only abnormal results are displayed) Labs Reviewed  CBC - Abnormal; Notable for the following components:      Result Value   RBC 3.25 (*)    Hemoglobin 8.7 (*)    HCT 24.6 (*)    MCV 75.7 (*)    RDW 17.7 (*)    All other components within normal limits  URINALYSIS, ROUTINE W REFLEX MICROSCOPIC - Abnormal; Notable for the following components:   Color, Urine STRAW (*)    APPearance HAZY (*)    All other components within normal limits  LIPASE, BLOOD  COMPREHENSIVE METABOLIC PANEL  I-STAT BETA HCG BLOOD, ED ([facility], [facility], [facility] ONLY)    EKG None  Radiology CT Abdomen Pelvis W  Contrast  Result Date: 07/18/2020 CLINICAL DATA:  Abdominal pain.  Nonlocalized EXAM: CT ABDOMEN AND PELVIS WITH CONTRAST TECHNIQUE: Multidetector CT imaging of the abdomen and pelvis was performed using the standard protocol following bolus administration of intravenous contrast. CONTRAST:  OMNIPAQUE IOHEXOL 300 MG/ML  SOLN COMPARISON:  None. FINDINGS: Lower chest: Lung bases are clear. Hepatobiliary: No focal hepatic lesion. No biliary duct dilatation. Common bile duct is normal. Pancreas: Pancreas is normal. No ductal dilatation. No pancreatic inflammation. Spleen: Spleen appears mildly enlarged measuring 12.5 x 11.5 x 6.2 cm (volume = 470 cm^3). Adrenals/urinary tract: Adrenal glands and kidneys are normal. The ureters and bladder normal. Stomach/Bowel: Stomach, small bowel, appendix, and cecum are normal. The colon and rectosigmoid colon are normal. Vascular/Lymphatic: Abdominal aorta is normal caliber. No periportal or retroperitoneal adenopathy. No pelvic adenopathy. Reproductive: Uterus and ovaries normal. Enhancing follicle of the RIGHT ovary. Other: No inguinal hernia. Laxity of the abdominal wall at the level the umbilicus without herniation. Musculoskeletal: No aggressive osseous lesion. IMPRESSION: 1. No acute abdominopelvic findings. 2. Normal gallbladder and appendix. 3. Borderline splenomegaly Electronically Signed   By: Genevive Bi M.D.   On: 07/18/2020 10:26    Procedures Procedures   Medications Ordered in ED Medications  dicyclomine (BENTYL) capsule 10 mg (has no administration in time range)  sodium chloride 0.9 % bolus 1,000 mL (1,000 mLs Intravenous New Bag/Given 07/18/20 0840)  ondansetron (ZOFRAN) injection 4 mg (4 mg Intravenous Given 07/18/20 0912)  iohexol (OMNIPAQUE) 300 MG/ML solution 100 mL (100 mLs Intravenous Contrast Given 07/18/20 0945)    ED Course  I have reviewed the triage vital signs and the nursing notes.  Pertinent labs & imaging results that were  available during my care of the patient were reviewed by me and considered in my medical decision making (see chart for details).  Clinical Course as of 07/18/20 1040  Sat Jul 18, 2020  3420 40 year old female with complaint of abdominal pain and diarrhea as above.  On exam, found to have generalized tenderness.  Patient is otherwise well-appearing.  Labs without significant change, CBC with hemoglobin of 8.7, reports history of anemia and sickle cell trait, no blood in stools.  CMP unremarkable, urinalysis without significant changes, lipase within normal limits and hCG negative. CT abdomen pelvis ordered for diverticulitis versus colitis, mild splenomegaly otherwise unremarkable. Vitals without significant findings, SPO2 on room air 100%. Plan is to treat with Bentyl, bland diet, advised to recheck with PCP and return to ED precautions given. [LM]    Clinical Course User Index [LM] Alden Hipp   MDM Rules/Calculators/A&P                          Final Clinical Impression(s) / ED Diagnoses Final diagnoses:  Generalized abdominal pain  Diarrhea, unspecified type    Rx / DC Orders ED Discharge Orders          Ordered    dicyclomine (BENTYL) 20 MG tablet  2 times daily        07/18/20 1038             Alden Hipp 07/18/20 1040    Terrilee Files, MD 07/18/20 1751

## 2020-07-23 ENCOUNTER — Emergency Department (HOSPITAL_COMMUNITY): Payer: Self-pay

## 2020-07-23 ENCOUNTER — Other Ambulatory Visit: Payer: Self-pay

## 2020-07-23 ENCOUNTER — Emergency Department (HOSPITAL_COMMUNITY)
Admission: EM | Admit: 2020-07-23 | Discharge: 2020-07-23 | Disposition: A | Discharge status: Home / Self Care | Payer: Self-pay | Attending: Emergency Medicine | Admitting: Emergency Medicine

## 2020-07-23 ENCOUNTER — Encounter (HOSPITAL_COMMUNITY): Payer: Self-pay | Admitting: Emergency Medicine

## 2020-07-23 DIAGNOSIS — R6884 Jaw pain: Secondary | ICD-10-CM

## 2020-07-23 DIAGNOSIS — S0302XA Dislocation of jaw, left side, initial encounter: Secondary | ICD-10-CM | POA: Insufficient documentation

## 2020-07-23 DIAGNOSIS — S0300XA Dislocation of jaw, unspecified side, initial encounter: Secondary | ICD-10-CM

## 2020-07-23 DIAGNOSIS — X58XXXA Exposure to other specified factors, initial encounter: Secondary | ICD-10-CM | POA: Insufficient documentation

## 2020-07-23 MED ORDER — PROPOFOL 10 MG/ML IV BOLUS
INTRAVENOUS | Status: AC | PRN
  Administered 2020-07-23 (×2): 40 mg via INTRAVENOUS

## 2020-07-23 MED ORDER — PROPOFOL 10 MG/ML IV BOLUS
0.5000 mg/kg | Freq: Once | INTRAVENOUS | Status: AC
  Administered 2020-07-23: 50.8 mg via INTRAVENOUS
  Filled 2020-07-23: qty 20

## 2020-07-23 MED ORDER — PROPOFOL 10 MG/ML IV BOLUS
INTRAVENOUS | Status: AC
  Administered 2020-07-23: 10 mg
  Filled 2020-07-23: qty 20

## 2020-07-23 MED ORDER — PROPOFOL 10 MG/ML IV BOLUS
0.5000 mg/kg | Freq: Once | INTRAVENOUS | Status: AC
  Administered 2020-07-23: 40 mg via INTRAVENOUS
  Filled 2020-07-23: qty 20

## 2020-07-23 NOTE — Progress Notes (Signed)
This writer was called to bedside for conscious sedation procedure.  Pt tolerated well without incident.

## 2020-07-23 NOTE — ED Triage Notes (Signed)
Pt states that she was eating when her jaw displaced. Pt's jaw is not aligned on the left side. Pt states that this has happened before, and that her pain is 10/10

## 2020-07-23 NOTE — Discharge Instructions (Addendum)
Soft diet for the next several days, then slowly attempt to advance as tolerated.  Return to the emergency department for any new and/or concerning symptoms.

## 2020-07-23 NOTE — ED Provider Notes (Signed)
[facility]-EMERGENCY DEPT Provider Note   CSN: [id number] Arrival date & time: 07/23/20  0001     History Chief Complaint  Patient presents with   Jaw Pain    Taylor Roman is a 40 y.o. female.  Patient is a 40 year old female with past medical history of recurrent jaw dislocations.  Patient was eating chicken this evening when the left TMJ dislocated.  She describes jaw pain and malocclusion.  She denies any difficulty breathing or swallowing.  There are no aggravating or alleviating factors.  The history is provided by the patient.      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    Past Surgical History:  Procedure Laterality Date   CERVICAL CONIZATION W/BX N/A 11/03/2015   Procedure: CONIZATION CERVIX WITH BIOPSY;  Surgeon: Los Alvarez Bing, MD;  Location: [facility];  Service: Gynecology;  Laterality: N/A;   HYSTEROSCOPY WITH D & C N/A 05/10/2017   Procedure: DILATATION AND CURETTAGE /HYSTEROSCOPY;  Surgeon: Conan Bowens, MD;  Location:  SURGERY CENTER;  Service: Gynecology;   Laterality: N/A;     OB History     Gravida  4   Para  4   Term  4   Preterm  0   AB  0   Living  3      SAB  0   IAB  0   Ectopic  0   Multiple  0   Live Births  4           Family History  Problem Relation Age of Onset   Cancer Mother        Hepatic carcinoma?  No history of viral hepatitis she is aware of   Diabetes Maternal Grandmother    Anesthesia problems Neg Hx     Social History   Tobacco Use   Smoking status: Never   Smokeless tobacco: Never  Vaping Use   Vaping Use: Never used  Substance Use Topics   Alcohol use: No   Drug use: No    Home Medications Prior to Admission medications   Medication Sig Start Date End Date Taking? Authorizing Provider  acetaminophen (TYLENOL) 500 MG tablet Take 500 mg by mouth every 6 (six) hours as needed for mild pain.    [provider]  cyclobenzaprine (FLEXERIL) 5 MG tablet Take 1 tablet (5 mg total) by mouth 3 (three) times daily as needed for muscle spasms. 02/20/19   Elige Radon, MD  dicyclomine (BENTYL) 20  MG tablet Take 1 tablet (20 mg total) by mouth 2 (two) times daily. 07/18/20   Jeannie Fend, PA-C  ferrous sulfate 325 (65 FE) MG tablet Take 325 mg by mouth daily with breakfast.    [provider]  ibuprofen (ADVIL) 800 MG tablet Take 1 tablet (800 mg total) by mouth every 6 (six) hours as needed for moderate pain. 01/05/20   Gilda Crease, MD  loratadine (CLARITIN) 10 MG tablet Take 1 tablet (10 mg total) by mouth daily. Patient not taking: Reported on 06/06/2019 04/19/19 06/06/19  Julieanne Manson, MD    Allergies    Patient has no known allergies.  Review of Systems   Review of Systems  All other systems reviewed and are negative.  Physical Exam Updated Vital Signs BP (!) 163/94 (BP Location: Left Arm)   Pulse 76   Temp 98.5 F (36.9 C) (Oral)   Resp 18   Ht 5\' 7"  (1.702 m)   Wt 101.6 kg   SpO2 100%   BMI 35.08 kg/m   Physical Exam Vitals and  nursing note reviewed.  Constitutional:      General: She is not in acute distress.    Appearance: Normal appearance. She is well-developed. She is not diaphoretic.  HENT:     Head: Normocephalic and atraumatic.     Ears:     Comments: There is protrusion of the mandible.  There is limited range of motion and pain when she attempts to open or close her jaw.  There is tenderness over the left TMJ. Cardiovascular:     Rate and Rhythm: Normal rate and regular rhythm.     Heart sounds: No murmur heard.   No friction rub. No gallop.  Pulmonary:     Effort: Pulmonary effort is normal. No respiratory distress.     Breath sounds: Normal breath sounds. No wheezing.  Abdominal:     General: Bowel sounds are normal. There is no distension.     Palpations: Abdomen is soft.     Tenderness: There is no abdominal tenderness.  Musculoskeletal:        General: Normal range of motion.     Cervical back: Normal range of motion and neck supple.  Skin:    General: Skin is warm and dry.  Neurological:     General: No focal deficit present.     Mental Status: She is alert and oriented to person, place, and time.    ED Results / Procedures / Treatments   Labs (all labs ordered are listed, but only abnormal results are displayed) Labs Reviewed - No data to display  EKG None  Radiology No results found.  Procedures Reduction of dislocation  Date/Time: 07/23/2020 6:14 AM Performed by: 07/25/2020, MD Authorized by: Geoffery Lyons, MD  Consent: Verbal consent obtained. Written consent obtained. Risks and benefits: risks, benefits and alternatives were discussed Consent given by: patient Patient understanding: patient states understanding of the procedure being performed Patient consent: the patient's understanding of the procedure matches consent given Procedure consent: procedure consent matches procedure scheduled Relevant documents: relevant documents present and verified Test results:  test results available and properly labeled Site marked: the operative site was marked Imaging studies: imaging studies available Patient identity confirmed: verbally with patient and arm band Time out: Immediately prior to procedure a "time out" was called to verify the correct patient, procedure, equipment, support staff and site/side marked as required. Local anesthesia used: no  Anesthesia: Local anesthesia used: no  Sedation: Patient sedated: yes Sedation type: moderate (conscious) sedation Sedatives: propofol  Patient tolerance: patient tolerated the procedure well with no immediate complications Comments: The mandible dislocation was reduced under conscious sedation.  Patient tolerated the procedure well.   .Sedation  Date/Time: 07/23/2020 6:15 AM Performed by: Geoffery Lyons, MD Authorized by: Geoffery Lyons, MD   Consent:    Consent obtained:  Written and verbal   Consent given by:  Patient   Risks discussed:  Prolonged hypoxia resulting in organ damage, allergic reaction and inadequate sedation Universal protocol:    Procedure explained and questions answered to patient or proxy's satisfaction: yes     Relevant documents present and verified: yes     Test results available: yes     Imaging studies available: yes     Immediately prior to procedure, a time out was called: yes     Patient identity confirmed:  Anonymous protocol, patient vented/unresponsive and arm band Indications:    Procedure performed:  Dislocation reduction   Procedure necessitating sedation performed by:  Physician performing sedation Pre-sedation assessment:    Time since last food or drink:  6 hours   ASA classification: class 1 - normal, healthy patient     Mallampati score:  I - soft palate, uvula, fauces, pillars visible   Neck mobility: normal     Pre-sedation assessments completed and reviewed: airway patency and mental status   Immediate pre-procedure details:    Reassessment: Patient  reassessed immediately prior to procedure   Procedure details (see MAR for exact dosages):    Preoxygenation:  Nasal cannula   Sedation:  Propofol   Intended level of sedation: deep and moderate (conscious sedation)   Intra-procedure monitoring:  Blood pressure monitoring, continuous capnometry, continuous pulse oximetry, frequent vital sign checks and frequent LOC assessments   Intra-procedure events: none     Total Provider sedation time (minutes):  15 Post-procedure details:    Attendance: Constant attendance by certified staff until patient recovered     Recovery: Patient returned to pre-procedure baseline     Post-sedation assessments completed and reviewed: airway patency     Patient is stable for discharge or admission: yes     Procedure completion:  Tolerated well, no immediate complications   Medications Ordered in ED Medications  propofol (DIPRIVAN) 10 mg/mL bolus/IV push 50.8 mg (has no administration in time range)    ED Course  I have reviewed the triage vital signs and the nursing notes.  Pertinent labs & imaging results that were available during my care of the patient were reviewed by me and considered in my medical decision making (see chart for details).    MDM Rules/Calculators/A&P  Patient presenting here with a left TMJ dislocation while eating chicken.  Patient underwent conscious sedation and attempted reduction, however was unsuccessful.  Imaging studies of the facial bones was obtained showing anterior translation of the left mandibular head.  Conscious sedation was performed a second time and this time reduction was successful.  Patient is able to open and close her mouth and feels significantly improved.  She will be discharged on a soft diet.  Final Clinical Impression(s) / ED Diagnoses Final diagnoses:  None    Rx / DC Orders ED Discharge Orders     None        Geoffery Lyons, MD 07/23/20 [phone]

## 2020-07-23 NOTE — Sedation Documentation (Signed)
40mg  Propofol administered by , RN.

## 2020-07-23 NOTE — Sedation Documentation (Signed)
40mg Propofol administered by Jake, RN. 

## 2020-07-23 NOTE — Progress Notes (Signed)
This Clinical research associate arrived to pt bedside for conscious sedation procedure.  Pt tolerated well without incident.

## 2020-07-23 NOTE — ED Notes (Signed)
Pt woke up when her name was called. When asked if it felt better, she states, "yeah". Dr. Judd Lien made aware.

## 2020-09-25 ENCOUNTER — Other Ambulatory Visit: Payer: Self-pay

## 2020-09-25 ENCOUNTER — Ambulatory Visit: Payer: Self-pay | Admitting: Internal Medicine

## 2020-09-25 ENCOUNTER — Encounter: Payer: Self-pay | Admitting: Internal Medicine

## 2020-09-25 VITALS — BP 108/58 | HR 64 | Resp 20 | Ht 67.0 in | Wt 224.5 lb

## 2020-09-25 DIAGNOSIS — M546 Pain in thoracic spine: Secondary | ICD-10-CM

## 2020-09-25 DIAGNOSIS — Z0000 Encounter for general adult medical examination without abnormal findings: Secondary | ICD-10-CM

## 2020-09-25 DIAGNOSIS — M25562 Pain in left knee: Secondary | ICD-10-CM

## 2020-09-25 DIAGNOSIS — Z1231 Encounter for screening mammogram for malignant neoplasm of breast: Secondary | ICD-10-CM

## 2020-09-25 DIAGNOSIS — D5720 Sickle-cell/Hb-C disease without crisis: Secondary | ICD-10-CM

## 2020-09-25 DIAGNOSIS — G8929 Other chronic pain: Secondary | ICD-10-CM

## 2020-09-25 MED ORDER — CYCLOBENZAPRINE HCL 5 MG PO TABS: ORAL_TABLET | ORAL | 1 refills | Status: DC

## 2020-09-25 MED ORDER — CALCIUM CITRATE-VITAMIN D 250-200 MG-UNIT PO TABS: ORAL_TABLET | ORAL | Status: DC

## 2020-09-25 NOTE — Progress Notes (Signed)
Subjective:    Patient ID: Taylor Roman, female   DOB: 1980-10-19, 40 y.o.   MRN: [id number]   HPI  CPE without pap  1.  Pap:  Last pap 09/2019 and normal.    2.  Mammogram:  Never.   No family history of breast cancer.    3.  Osteoprevention:  Calcium 500 mg once daily.  Walks every morning.  About 45-50 minutes.    4.  Guaiac Cards:  States she has done before, but nothing in chart.    5.  Colonoscopy:  Never.  No family history of colon cancer.   6.  Immunizations:  Has not had the bivalent COVID vaccine booster.   Immunization History  Administered Date(s) Administered   Influenza Inj Mdck Quad Pf 11/21/2018   Influenza Split 10/06/2011   Influenza,inj,Quad PF,6+ Mos 01/15/2015   Moderna Sars-Covid-2 Vaccination 09/25/2019, 10/28/2019, 04/27/2020   Tdap 10/20/2011, 03/23/2015     7.  Glucose/Cholesterol:  never a problem with high glucose.  History of mildly elevated Trigs.   Lipid Panel     Component Value Date/Time   CHOL 150 07/19/2019 0921   TRIG 171 (H) 07/19/2019 0921   HDL 45 07/19/2019 0921   LDLCALC 76 07/19/2019 0921   LABVLDL 29 07/19/2019 0921     Current Meds  Medication Sig   folic acid (FOLVITE) 800 MCG tablet Take 400 mcg by mouth daily.   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  To have repeat pap in JUne 2018 at [facility]    Past Surgical History:  Procedure Laterality Date   CERVICAL CONIZATION W/BX N/A 11/03/2015   Procedure: CONIZATION CERVIX WITH BIOPSY;  Surgeon: Orland Bing, MD;  Location: [facility];  Service: Gynecology;  Laterality: N/A;   HYSTEROSCOPY WITH D & C N/A 05/10/2017   Procedure: DILATATION AND CURETTAGE /HYSTEROSCOPY;   Surgeon: Conan Bowens, MD;  Location: [facility];  Service: Gynecology;  Laterality: N/A;    Social History   Socioeconomic History   Marital status: Single    Spouse name: Maliki   Number of children: 3   Years of education: 12   Highest education level: Not on file  Occupational History   Occupation: Hair care  Tobacco Use   Smoking status: Never   Smokeless tobacco: Never  Vaping Use   Vaping Use: Never used  Substance and Sexual Activity   Alcohol use: No   Drug use: No   Sexual activity: Yes    Birth control/protection: None  Other Topics Concern   Not on file  Social History Narrative   Originally from [place]   Speaks [place], [place] and [place]   Came to [employer]. In 2009   Lives with her 3 young sons.   Father of children,Maliki, her boyfriend now lives with the family as well.   Social Determinants of Health   Financial Resource Strain: Low Risk    Difficulty of Paying Living Expenses: Not hard at all  Food Insecurity: No Food Insecurity   Worried About Programme researcher, broadcasting/film/video in the Last Year: Never true  Ran Out of Food in the Last Year: Never true  Transportation Needs: No Transportation Needs   Lack of Transportation (Medical): No   Lack of Transportation (Non-Medical): No  Physical Activity: Not on file  Stress: Not on file  Social Connections: Not on file  Intimate Partner Violence: Not At Risk   Fear of Current or Ex-Partner: No   Emotionally Abused: No   Physically Abused: No   Sexually Abused: No    Review of Systems  Musculoskeletal:        Left knee pain:  in January twisted left knee and fell on it anteriorly.  Changes of DJD noted and small effusion.  If walks too much, has increased pain.  Has more pain going up stairs than down and feels as if something caught in it.  The whole knee hurts.  Appears to sell on her.  Straightening the knee hurts.  Patella pops when she straightens as well--uncomfortable.    Right upper back pain  medial to scapula comes and goes for years.       Objective:   BP (!) 108/58 (BP Location: Right Arm, Patient Position: Sitting, Cuff Size: Normal)   Pulse 64   Resp 20   Ht 5\' 7"  (1.702 m)   Wt 224 lb 8 oz (101.8 kg)   LMP 09/13/2020   BMI 35.16 kg/m   Physical Exam Constitutional:      Appearance: She is obese.  HENT:     Head: Normocephalic and atraumatic.     Right Ear: Tympanic membrane, ear canal and external ear normal.     Left Ear: Tympanic membrane, ear canal and external ear normal.     Ears:     Comments: Minimal tenderness over left TMJ    Nose: Nose normal.     Mouth/Throat:     Mouth: Mucous membranes are moist.     Pharynx: Oropharynx is clear.  Eyes:     Extraocular Movements: Extraocular movements intact.     Conjunctiva/sclera: Conjunctivae normal.     Pupils: Pupils are equal, round, and reactive to light.     Comments: Discs sharp bilaterally.  Neck:     Thyroid: No thyroid mass or thyromegaly.  Cardiovascular:     Rate and Rhythm: Normal rate and regular rhythm.     Heart sounds: S1 normal and S2 normal. No murmur heard.   No friction rub. No S3 or S4 sounds.     Comments: No carotid bruits.  Carotid, radial, femoral, DP and PT pulses normal and equal.    Pulmonary:     Effort: Pulmonary effort is normal.     Breath sounds: Normal breath sounds.  Chest:  Breasts:    Right: No swelling, inverted nipple, mass, nipple discharge or tenderness.     Left: No swelling, inverted nipple, mass, nipple discharge or tenderness.  Abdominal:     General: Bowel sounds are normal.     Palpations: Abdomen is soft. There is no hepatomegaly, splenomegaly or mass.     Tenderness: There is no abdominal tenderness.     Hernia: No hernia is present.  Genitourinary:    Comments: Normal external female genitalia. No discharge No uterine or adnexal mass or tenderness.  Musculoskeletal:        General: Normal range of motion.     Cervical back: Normal range of  motion and neck supple.     Thoracic back: Spasms (Right thoracic back with palpable muscle spasm.) present.  Right lower leg: No edema.     Left lower leg: No edema.     Comments: Left knee with pain on compression of patella against anterior knee joint.  Some tenderness along bilateral joint margin without palpable effusion.  Full ROM, but with some discomfort.  No increased pain or laxity with stress of cruciates or collateral ligaments.  Lymphadenopathy:     Head:     Right side of head: No submental or submandibular adenopathy.     Left side of head: No submental or submandibular adenopathy.     Cervical: No cervical adenopathy.     Upper Body:     Right upper body: No supraclavicular or axillary adenopathy.     Left upper body: No supraclavicular or axillary adenopathy.     Lower Body: No right inguinal adenopathy. No left inguinal adenopathy.  Skin:    General: Skin is warm.     Capillary Refill: Capillary refill takes less than 2 seconds.     Findings: No rash.  Neurological:     General: No focal deficit present.     Mental Status: She is alert and oriented to person, place, and time.     Cranial Nerves: Cranial nerves 2-12 are intact.     Sensory: Sensation is intact.     Motor: Motor function is intact.     Coordination: Coordination is intact.     Gait: Gait is intact.     Deep Tendon Reflexes: Reflexes are normal and symmetric.  Psychiatric:        Speech: Speech normal.        Behavior: Behavior normal. Behavior is cooperative.     Assessment & Plan    CPE without pap Mammogram ordered Calcium citrate with vitamin D twice daily. Follow up for fasting labs in 2 weeks with return of FIT testing and Covid bivalent vaccine:  FLP, CBC, CMP  2.  Sickle Cell-Hemoglobin C Disease:  as per Hematology at [facility].  Has also been contacted by [facility] and [facility] for support.  3.  Right thoracic back muscle spasm and pain:  cyclobenzaprine at bedtime as  needed.  PT referral.  4.  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  Referral to [facility] ortho.  Will need to apply for financial assistance.

## 2020-10-09 ENCOUNTER — Other Ambulatory Visit: Payer: Self-pay

## 2020-10-09 DIAGNOSIS — D649 Anemia, unspecified: Secondary | ICD-10-CM

## 2020-10-09 DIAGNOSIS — Z79899 Other long term (current) drug therapy: Secondary | ICD-10-CM

## 2020-10-09 DIAGNOSIS — Z13220 Encounter for screening for lipoid disorders: Secondary | ICD-10-CM

## 2020-10-10 LAB — COMPREHENSIVE METABOLIC PANEL
ALT: 14 IU/L (ref 0–32)
AST: 20 IU/L (ref 0–40)
Albumin/Globulin Ratio: 1.7 (ref 1.2–2.2)
Albumin: 4.6 g/dL (ref 3.8–4.8)
Alkaline Phosphatase: 54 IU/L (ref 44–121)
BUN/Creatinine Ratio: 12 (ref 9–23)
BUN: 8 mg/dL (ref 6–24)
Bilirubin Total: 0.9 mg/dL (ref 0.0–1.2)
CO2: 24 mmol/L (ref 20–29)
Calcium: 9.1 mg/dL (ref 8.7–10.2)
Chloride: 108 mmol/L — ABNORMAL HIGH (ref 96–106)
Creatinine, Ser: 0.66 mg/dL (ref 0.57–1.00)
Globulin, Total: 2.7 g/dL (ref 1.5–4.5)
Glucose: 95 mg/dL (ref 70–99)
Potassium: 4.2 mmol/L (ref 3.5–5.2)
Sodium: 145 mmol/L — ABNORMAL HIGH (ref 134–144)
Total Protein: 7.3 g/dL (ref 6.0–8.5)
eGFR: 113 mL/min/{1.73_m2} (ref >59)

## 2020-10-10 LAB — CBC WITH DIFFERENTIAL/PLATELET
Basophils Absolute: 0.1 10*3/uL (ref 0.0–0.2)
Basos: 1 %
EOS (ABSOLUTE): 0.2 10*3/uL (ref 0.0–0.4)
Eos: 3 %
Hematocrit: 29.1 % — ABNORMAL LOW (ref 34.0–46.6)
Hemoglobin: 9 g/dL — ABNORMAL LOW (ref 11.1–15.9)
Immature Grans (Abs): 0 10*3/uL (ref 0.0–0.1)
Immature Granulocytes: 0 %
Lymphocytes Absolute: 1.3 10*3/uL (ref 0.7–3.1)
Lymphs: 27 %
MCH: 25.6 pg — ABNORMAL LOW (ref 26.6–33.0)
MCHC: 30.9 g/dL — ABNORMAL LOW (ref 31.5–35.7)
MCV: 83 fL (ref 79–97)
Monocytes Absolute: 0.3 10*3/uL (ref 0.1–0.9)
Monocytes: 7 %
Neutrophils Absolute: 2.9 10*3/uL (ref 1.4–7.0)
Neutrophils: 62 %
Platelets: 237 10*3/uL (ref 150–450)
RBC: 3.52 x10E6/uL — ABNORMAL LOW (ref 3.77–5.28)
RDW: 18.7 % — ABNORMAL HIGH (ref 11.7–15.4)
WBC: 4.8 10*3/uL (ref 3.4–10.8)

## 2020-10-10 LAB — LIPID PANEL W/O CHOL/HDL RATIO
Cholesterol, Total: 141 mg/dL (ref 100–199)
HDL: 51 mg/dL (ref >39)
LDL Chol Calc (NIH): 75 mg/dL (ref 0–99)
Triglycerides: 77 mg/dL (ref 0–149)
VLDL Cholesterol Cal: 15 mg/dL (ref 5–40)

## 2020-10-12 ENCOUNTER — Other Ambulatory Visit: Payer: Self-pay | Admitting: Obstetrics and Gynecology

## 2020-10-12 ENCOUNTER — Telehealth: Payer: Self-pay

## 2020-10-12 DIAGNOSIS — Z1231 Encounter for screening mammogram for malignant neoplasm of breast: Secondary | ICD-10-CM

## 2020-10-12 NOTE — Telephone Encounter (Signed)
Telephoned patient at mobile number. Left a voice message with [facility] contact information. 

## 2020-10-15 ENCOUNTER — Other Ambulatory Visit: Payer: Self-pay

## 2020-10-15 ENCOUNTER — Encounter: Payer: Self-pay | Admitting: Orthopaedic Surgery

## 2020-10-15 ENCOUNTER — Ambulatory Visit (INDEPENDENT_AMBULATORY_CARE_PROVIDER_SITE_OTHER): Payer: Self-pay | Admitting: Orthopaedic Surgery

## 2020-10-15 DIAGNOSIS — M25562 Pain in left knee: Secondary | ICD-10-CM

## 2020-10-15 DIAGNOSIS — G8929 Other chronic pain: Secondary | ICD-10-CM

## 2020-10-15 NOTE — Progress Notes (Signed)
Office Visit Note   Patient: Taylor Roman           Date of Birth: 1980-07-19           MRN: [id number] Visit Date: 10/15/2020              Requested by: Julieanne Manson, MD [address] [place],  [place] [address] PCP: Julieanne Manson, MD   Assessment & Plan: Visit Diagnoses: No diagnosis found.  Plan: Findings consistent with recurrent patellar dislocation. Patient has PT ordered through her PCP which she may begin. We will order an MRI to evaluate MPFL. Given clinical exam and chronicity of the problem she may require surgucal intervention  Follow-Up Instructions: No follow-ups on file.   Orders:  No orders of the defined types were placed in this encounter.  No orders of the defined types were placed in this encounter.     Procedures: No procedures performed   Clinical Data: No additional findings.   Subjective: Chief Complaint  Patient presents with   Left Knee - Pain  Patient presents today for left knee pain. She said that it has been hurting for years, but worsened in January when her knee twisted and she fell. She said that it hurts more anteriorly. She is taking over the counter pain medicine. She reports a history of her knee cap popping out to the side and have to manually reduce it    Review of Systems  All other systems reviewed and are negative.   Objective: Vital Signs: There were no vitals taken for this visit.  Physical Exam Constitutional:      Appearance: Normal appearance.  Skin:    General: Skin is warm and dry.  Neurological:     General: No focal deficit present.     Mental Status: She is alert.  Psychiatric:        Mood and Affect: Mood normal.        Behavior: Behavior normal.    Ortho Exam Examination of her left knee she has no effusion no swelling.  No warmth.  She has an excellent endpoint on anterior draw good endpoint with Lachman testing.  No tenderness over the medial or lateral joint line.  She has visual  subluxation of the patella with range of motion.  She has a positive apprehension sign.  Previous radiographs which did not include a sunrise view were reviewed on AP view there is noted subluxation laterally of the patella Specialty Comments:  No specialty comments available.  Imaging: No results found.   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  To have repeat pap in JUne 2018 at [facility]    Family History  Problem Relation Age of Onset   Cancer Mother        Hepatic carcinoma?  No history of viral hepatitis she is aware of   Other Sister        Hemoglobin HC Disease   Other Brother        Hemoglobin HC Disease   Learning disabilities Maternal Grandmother    Diabetes Maternal Grandmother    Anesthesia problems Neg Hx     Past Surgical History:  Procedure Laterality Date   CERVICAL CONIZATION W/BX N/A 11/03/2015   Procedure: CONIZATION CERVIX WITH BIOPSY;  Surgeon: [place] Bing, MD;  Location: [facility];  Service: Gynecology;  Laterality: N/A;   HYSTEROSCOPY WITH D & C N/A 05/10/2017   Procedure: DILATATION AND CURETTAGE  /HYSTEROSCOPY;  Surgeon: Conan Bowens, MD;  Location: [facility];  Service: Gynecology;  Laterality: N/A;   Social History   Occupational History   Occupation: Hair care  Tobacco Use   Smoking status: Never   Smokeless tobacco: Never  Vaping Use   Vaping Use: Never used  Substance and Sexual Activity   Alcohol use: No   Drug use: No   Sexual activity: Yes    Birth control/protection: None

## 2020-11-11 ENCOUNTER — Other Ambulatory Visit: Payer: Self-pay

## 2020-11-11 ENCOUNTER — Other Ambulatory Visit (INDEPENDENT_AMBULATORY_CARE_PROVIDER_SITE_OTHER): Payer: Self-pay

## 2020-11-11 DIAGNOSIS — R059 Cough, unspecified: Secondary | ICD-10-CM

## 2020-11-11 LAB — POC COVID19 BINAXNOW: SARS Coronavirus 2 Ag: NEGATIVE

## 2020-11-11 NOTE — Progress Notes (Unsigned)
Pt came to get a Covid test after experiencing runny nose, cough, and mild chest pain when coughing. Symptoms started 11/10/20. COVID-19 test was negative. Patient was notified.

## 2020-11-13 NOTE — Progress Notes (Incomplete)
Have her pick up robitussin PE and she can take Ibuprofen with it--for sore throat or fever.

## 2020-11-17 ENCOUNTER — Encounter: Payer: Self-pay | Admitting: *Deleted

## 2020-11-24 ENCOUNTER — Other Ambulatory Visit (INDEPENDENT_AMBULATORY_CARE_PROVIDER_SITE_OTHER): Payer: Self-pay | Admitting: Internal Medicine

## 2020-11-24 DIAGNOSIS — Z1211 Encounter for screening for malignant neoplasm of colon: Secondary | ICD-10-CM

## 2020-11-25 ENCOUNTER — Other Ambulatory Visit: Payer: Self-pay

## 2020-11-25 ENCOUNTER — Other Ambulatory Visit: Payer: Self-pay | Admitting: Internal Medicine

## 2020-11-25 LAB — POC FIT TEST STOOL: Fecal Occult Blood: NEGATIVE

## 2020-12-01 ENCOUNTER — Ambulatory Visit: Payer: Self-pay | Admitting: *Deleted

## 2020-12-01 ENCOUNTER — Other Ambulatory Visit: Payer: Self-pay

## 2020-12-01 ENCOUNTER — Ambulatory Visit
Admission: RE | Admit: 2020-12-01 | Discharge: 2020-12-01 | Disposition: A | Discharge status: Home / Self Care | Payer: No Typology Code available for payment source | Source: Ambulatory Visit | Attending: Obstetrics and Gynecology | Admitting: Obstetrics and Gynecology

## 2020-12-01 VITALS — BP 110/72 | Wt 214.6 lb

## 2020-12-01 DIAGNOSIS — Z1231 Encounter for screening mammogram for malignant neoplasm of breast: Secondary | ICD-10-CM

## 2020-12-01 DIAGNOSIS — Z01419 Encounter for gynecological examination (general) (routine) without abnormal findings: Secondary | ICD-10-CM

## 2020-12-01 NOTE — Progress Notes (Signed)
Taylor Roman is a 40 y.o. [phone] female who presents to [facility] clinic today with no complaints.    Pap Smear: Pap smear completed today. Last Pap smear was 09/25/2019 at [facility] clinic and was normal. Patients previous Pap smear 12/20/2016 was normal with negative HPV. Patient has history of two abnormal Pap smears 08/18/2015 that was HGSIL with positive HPV that colposcopy was completed 10/07/2015 that showed CIN-II and CKC 12/07/2015 that was benign for follow-up. Patient has a history of one other abnormal Pap smear 07/04/2014 that was LSIL that a colposcopy was completed 10/17/2014 that no biopsies were completed due to patient was pregnant. Last Pap smear result is available in Epic.   Physical exam: Breasts Breasts symmetrical. No skin abnormalities bilateral breasts. No nipple retraction bilateral breasts. No nipple discharge bilateral breasts. No lymphadenopathy. No lumps palpated bilateral breasts. No complaints of pain or tenderness on exam.  Pelvic/Bimanual Ext Genitalia No lesions, no swelling and no discharge observed on external genitalia.        Vagina Vagina pink and normal texture. No lesions or discharge observed in vagina.        Cervix Cervix is present. Cervix pink and of normal texture. No discharge observed.    Uterus Uterus is present and palpable. Uterus in normal position and normal size.        Adnexae Bilateral ovaries present and palpable. No tenderness on palpation.         Rectovaginal No rectal exam completed today since patient had no rectal complaints. No skin abnormalities observed on exam.     Smoking History: Patient has never smoked.   Patient Navigation: Patient education provided. Access to services provided for patient through [facility] program.    Breast and Cervical Cancer Risk Assessment: Patient does not have family history of breast cancer, known genetic mutations, or radiation treatment to the chest before age 79. Patient has history of  cervical dysplasia. Patient has no history of being immunocompromised or DES exposure in-utero.  Risk Assessment     Risk Scores       12/01/2020   Last edited by: Meryl Dare, CMA   5-year risk: 0.6 %   Lifetime risk: 9.6 %            A: B[facility]CP exam with pap smear No complaints.  P: Referred patient to the [facility] for a screening mammogram on mobile unit. Appointment scheduled Tuesday, December 01, 2020 at 0930.  Priscille Heidelberg, RN 12/01/2020 9:38 AM

## 2020-12-01 NOTE — Patient Instructions (Signed)
Explained breast self awareness with Taylor Roman. Pap smear completed today. Let patient know that her next Pap smear will be due based on the result of today's Pap smear due to her history of two abnormal Pap smears. Referred patient to the [facility] of [facility] for a screening mammogram on mobile unit. Appointment scheduled Tuesday, December 01, 2020 at 0930. Patient escorted to the mobile unit following B[facility]CP appointment for her screening mammogram. Let patient know will follow up with her within the next couple weeks with results of her Pap smear by phone. Informed patient that the Breast Center will follow-up with her within the next couple of weeks with results of her mammogram by letter or phone. Taylor Roman verbalized understanding.  Taylor Roman, [name], RN 9:38 AM

## 2020-12-02 ENCOUNTER — Telehealth: Payer: Self-pay

## 2020-12-02 LAB — CYTOLOGY - PAP
Comment: NEGATIVE
Diagnosis: NEGATIVE
High risk HPV: NEGATIVE

## 2020-12-02 NOTE — Telephone Encounter (Signed)
Attempted to contact patient about pap smear results. Left name and number for patient to call back. 

## 2020-12-03 ENCOUNTER — Telehealth: Payer: Self-pay

## 2020-12-03 NOTE — Telephone Encounter (Signed)
Left message on voicemail on requesting return call. Attempted to contact patient regarding pap results.

## 2020-12-04 ENCOUNTER — Telehealth: Payer: Self-pay

## 2020-12-04 NOTE — Telephone Encounter (Signed)
Patient informed negative Pap/HPV results, next pap due in 1 year, can do when comes back to [facility] for mammogram next year. Patient verbalized understanding.

## 2020-12-12 ENCOUNTER — Ambulatory Visit
Admission: RE | Admit: 2020-12-12 | Discharge: 2020-12-12 | Disposition: A | Discharge status: Home / Self Care | Payer: Self-pay | Source: Ambulatory Visit | Attending: Orthopaedic Surgery | Admitting: Orthopaedic Surgery

## 2020-12-12 ENCOUNTER — Other Ambulatory Visit: Payer: Self-pay

## 2020-12-12 DIAGNOSIS — G8929 Other chronic pain: Secondary | ICD-10-CM

## 2020-12-12 DIAGNOSIS — M25562 Pain in left knee: Secondary | ICD-10-CM

## 2020-12-15 ENCOUNTER — Ambulatory Visit: Payer: Self-pay | Admitting: Orthopaedic Surgery

## 2020-12-15 ENCOUNTER — Other Ambulatory Visit: Payer: Self-pay | Admitting: Orthopaedic Surgery

## 2020-12-15 ENCOUNTER — Other Ambulatory Visit: Payer: Self-pay

## 2020-12-15 DIAGNOSIS — G8929 Other chronic pain: Secondary | ICD-10-CM

## 2020-12-17 ENCOUNTER — Telehealth: Payer: Self-pay | Admitting: Orthopaedic Surgery

## 2020-12-17 NOTE — Telephone Encounter (Signed)
left message for pt to return call to sch MRI left knee review with Dr. Cleophas Dunker, after 01/02/21

## 2021-01-01 ENCOUNTER — Telehealth: Payer: Self-pay

## 2021-01-01 NOTE — Telephone Encounter (Signed)
Pt notified of Dr Delrae Alfred recommendations. Will call if issue is not resolved over the weekend.  Pt is not using anything new on her skin/eyes, not eating any new foods, and no new pets.

## 2021-01-01 NOTE — Telephone Encounter (Signed)
Pt called to report bumps around both eyes. Described bumps to have redness. Also experiencing headache, pain and itch around eyes. Started 12/28/20. Taking ibuprofen but has not help with this issue. First time experiencing this issue. Younger Son is also experiencing this issue with started 12/30/20.

## 2021-01-01 NOTE — Telephone Encounter (Signed)
Recommend using Benadryl 25 mg every 6 hours as needed and cool compresses.  If worsens, to go to urgent care.  Is she using something new on her skin/eyes?  Eating new food?  New pet?  If does not worsen, but not resolving to work in as acute beginning of next week.

## 2021-01-02 ENCOUNTER — Ambulatory Visit
Admission: RE | Admit: 2021-01-02 | Discharge: 2021-01-02 | Disposition: A | Discharge status: Home / Self Care | Payer: No Typology Code available for payment source | Source: Ambulatory Visit | Attending: Orthopaedic Surgery | Admitting: Orthopaedic Surgery

## 2021-01-02 ENCOUNTER — Other Ambulatory Visit: Payer: Self-pay

## 2021-01-02 DIAGNOSIS — M25562 Pain in left knee: Secondary | ICD-10-CM

## 2021-01-02 DIAGNOSIS — G8929 Other chronic pain: Secondary | ICD-10-CM

## 2021-01-05 ENCOUNTER — Ambulatory Visit (INDEPENDENT_AMBULATORY_CARE_PROVIDER_SITE_OTHER): Payer: Self-pay | Admitting: Orthopaedic Surgery

## 2021-01-05 ENCOUNTER — Other Ambulatory Visit: Payer: Self-pay

## 2021-01-05 ENCOUNTER — Encounter: Payer: Self-pay | Admitting: Orthopaedic Surgery

## 2021-01-05 DIAGNOSIS — G8929 Other chronic pain: Secondary | ICD-10-CM

## 2021-01-05 DIAGNOSIS — M25562 Pain in left knee: Secondary | ICD-10-CM | POA: Insufficient documentation

## 2021-01-05 NOTE — Progress Notes (Signed)
Office Visit Note   Patient: Taylor Roman           Date of Birth: [birth year]           MRN: [id number] Visit Date: 01/05/2021              Requested by: Julieanne Manson, MD [address] [place],  [place] [address] PCP: Julieanne Manson, MD   Assessment & Plan: Visit Diagnoses:  1. Chronic pain of left knee     Plan: MRI scan of the left knee demonstrates high-grade partial-thickness cartilage loss of the lateral patellofemoral compartment with areas of full-thickness cartilage loss and subchondral reactive marrow edema in the lateral trochlear.  There was partial-thickness cartilage loss of the medial patellofemoral compartment.  Medial compartment demonstrated high-grade partial-thickness cartilage loss and partial-thickness cartilage loss of the lateral compartment with subchondral reactive changes.  There was a large knee joint effusion.  The TT to TG distance was 28 mm.  The patella apex was perched on the lateral trochlear.  No aggressive osseous lesions.  Long discussion well over 30 minutes regarding all of the above.  I feel comfortable that her all of her symptoms are related to the osteoarthritis.  There was some very small tears of the medial lateral meniscus which I do not think are that symptomatic.  In the short-term I have suggested a course of therapy and then follow-up nonweightbearing exercises to strengthen her quad muscles.  Of also suggested weight loss.  Weight started 14 pounds.  We could always consider cortisone injection and even knee support.  There could have been a remote history of patella subluxation when she was much younger which would relate to the patellofemoral changes on the MRI scan, particularly laterally.  She is awfully young and I do not think that we should even consider knee replacement at this point without trying all of the above.  I did check her back in 6 or 8 weeks or sooner if there is a problem.  Follow-Up Instructions: Return if  symptoms worsen or fail to improve.   Orders:  Orders Placed This Encounter  Procedures   Ambulatory referral to Physical Therapy   No orders of the defined types were placed in this encounter.     Procedures: No procedures performed   Clinical Data: No additional findings.   Subjective: Chief Complaint  Patient presents with   Left Knee - Follow-up    MRI review  Patient presents today for follow up on her left knee. She had an MRI and is here today for those results.  No changes in symptoms.  HPI  Review of Systems   Objective: Vital Signs: There were no vitals taken for this visit.  Physical Exam Constitutional:      Appearance: She is well-developed.  Eyes:     Pupils: Pupils are equal, round, and reactive to light.  Pulmonary:     Effort: Pulmonary effort is normal.  Skin:    General: Skin is warm and dry.  Neurological:     Mental Status: She is alert and oriented to person, place, and time.  Psychiatric:        Behavior: Behavior normal.    Ortho Exam left knee with positive effusion.  There was medial and lateral joint pain as well as discomfort beneath the patella.  Patella appears to track in the midline but easily mobile both medially and laterally.  More pain laterally than medially consistent with the MRI scan with lateral  patella pressure syndrome.  No instability.  Does not have posterior medial or posterior lateral pain.  Full extension and flexed over 100 degrees.  There is no opening with a varus or valgus stress.  Negative anterior drawer sign.  No popliteal pain or mass.  No calf pain.  Painless range of motion both hips.  Straight leg raise negative  Specialty Comments:  No specialty comments available.  Imaging: No results found.   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  To have repeat pap in JUne 2018 at [facility]    Family History  Problem Relation Age of Onset   Cancer Mother        Hepatic carcinoma?  No history of viral hepatitis she is aware of   Other Sister        Hemoglobin HC Disease   Learning disabilities Maternal Grandmother    Diabetes Maternal Grandmother    Other Brother        Hemoglobin HC Disease   Anesthesia problems Neg Hx    Breast cancer Neg Hx     Past Surgical History:  Procedure Laterality Date   CERVICAL CONIZATION W/BX N/A 11/03/2015   Procedure: CONIZATION CERVIX WITH BIOPSY;  Surgeon: Venedy Bing, MD;  Location: [facility];  Service: Gynecology;  Laterality: N/A;   HYSTEROSCOPY WITH D & C N/A 05/10/2017   Procedure: DILATATION AND CURETTAGE /HYSTEROSCOPY;  Surgeon: Conan Bowens, MD;  Location: [facility];  Service: Gynecology;  Laterality: N/A;   Social History   Occupational History   Occupation: Hair care  Tobacco Use   Smoking status: Never   Smokeless tobacco: Never  Vaping Use   Vaping Use: Never used  Substance and Sexual Activity   Alcohol use: No   Drug use: No   Sexual activity: Yes    Birth control/protection: None

## 2021-01-15 ENCOUNTER — Other Ambulatory Visit: Payer: Self-pay

## 2021-01-15 ENCOUNTER — Encounter: Payer: Self-pay | Admitting: Physical Therapy

## 2021-01-15 ENCOUNTER — Ambulatory Visit (INDEPENDENT_AMBULATORY_CARE_PROVIDER_SITE_OTHER): Payer: No Typology Code available for payment source | Admitting: Physical Therapy

## 2021-01-15 DIAGNOSIS — M25562 Pain in left knee: Secondary | ICD-10-CM

## 2021-01-15 DIAGNOSIS — M6281 Muscle weakness (generalized): Secondary | ICD-10-CM

## 2021-01-15 DIAGNOSIS — R262 Difficulty in walking, not elsewhere classified: Secondary | ICD-10-CM

## 2021-01-15 DIAGNOSIS — G8929 Other chronic pain: Secondary | ICD-10-CM

## 2021-01-15 NOTE — Patient Instructions (Signed)
Access Code: [email or web address] URL: [email or web address] Date: 01/15/2021 Prepared by: Ivery Quale  Exercises Supine Straight Leg Hip Adduction and Quad Set with Newman Pies - 2 x daily - 6 x weekly - 2-3 sets - 10 reps Straight Leg Raise with External Rotation - 2 x daily - 6 x weekly - 3 sets - 10 reps Supine Bridge with Mini Swiss Ball Between Knees - 2 x daily - 6 x weekly - 3 sets - 10 reps Standing Gastroc Stretch at Counter - 2 x daily - 6 x weekly - 1 sets - 3 reps - 30 hold Heel Raises with Counter Support - 2 x daily - 6 x weekly - 2-3 sets - 10 reps

## 2021-01-15 NOTE — Therapy (Signed)
[facility] Physical Therapy [address] [place], [place], [address] Phone: [phone]   Fax:  [phone]  Physical Therapy Evaluation  Patient Details  Name: Kimberlye Roman MRN: [id number] Date of Birth: [birth year] Referring Provider (PT): Cleophas Dunker   Encounter Date: 01/15/2021   PT End of Session - 01/15/21 0920     Visit Number 1    Number of Visits 12    Date for PT Re-Evaluation 02/26/21    PT Start Time 0803    PT Stop Time 0847    PT Time Calculation (min) 44 min    Activity Tolerance Patient tolerated treatment well    Behavior During Therapy [facility] for tasks assessed/performed             Past Medical History:  Diagnosis Date   Anemia    Has Hgb [place] with sickling   Headache    otc med prn   Hemoglobin S-C disease (HCC) 2013, 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  To have repeat pap in JUne 2018 at [facility]    Past Surgical History:  Procedure Laterality Date   CERVICAL CONIZATION W/BX N/A 11/03/2015   Procedure: CONIZATION CERVIX WITH BIOPSY;  Surgeon: [place] Bing, MD;  Location: [facility];  Service: Gynecology;  Laterality: N/A;   HYSTEROSCOPY WITH D & C N/A 05/10/2017   Procedure: DILATATION AND CURETTAGE /HYSTEROSCOPY;  Surgeon: Conan Bowens, MD;  Location: [facility];  Service: Gynecology;  Laterality: N/A;    There were no vitals filed for this visit.    Subjective Assessment - 01/15/21 0850     Subjective Pt. presents to PT today with L Knee pain. She states that the "knee feels out of place". Pt. tripped over an object and fell down in January 2022 and has had multiple dislocations of the knee since this point. Pt. reports pain at the bottom of her foot towards heel. No numbness or tingling is noted. Pt. pain is consistently at a 7/10, throughout the day. 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    Limitations Standing;Walking    How long can you stand comfortably? 15 minutes    How long can you walk comfortably? 15 minutes    Patient Stated Goals Decrease knee pain while walking and standing.    Currently in Pain? Yes    Pain Score 7     Pain Location Knee    Pain Orientation Left    Pain Descriptors / Indicators Aching;Dull;Discomfort    Pain Type Chronic pain    Pain Onset More than a month ago    Pain Frequency Constant    Aggravating Factors  Standing, Walking    Pain Relieving Factors Medication (Ibuprophen, muscle relaxer prescribed by MD), Rest                [facility] PT Assessment - 01/15/21 0001       Assessment   Medical Diagnosis Lt knee OA, previous patella dislocation    Referring Provider (PT) [place]    Onset Date/Surgical Date 01/25/20    Next MD Visit N/A    Prior Therapy Yes, for elbow      Precautions   Precautions None    Precaution Comments MD recommending NWB exercises to start  Restrictions   Weight Bearing Restrictions No      Balance Screen   Has the patient fallen in the past 6 months No    Has the patient had a decrease in activity level because of a fear of falling?  No    Is the patient reluctant to leave their home because of a fear of falling?  No      Home Tourist information centre manager residence      Prior Function   Level of Independence Independent    Vocation Other (comment)    Vocation Requirements Hair Braider      Cognition   Overall Cognitive Status Within Functional Limits for tasks assessed      Observation/Other Assessments   Focus on Therapeutic Outcomes (FOTO)  53% functional intake, goal is 69%      ROM / Strength   AROM / PROM / Strength AROM;Strength      AROM   Overall AROM Comments R LE grossly WFL    AROM Assessment Site Hip;Knee    Right/Left Hip Right;Left    Left Hip External Rotation  20     Right/Left Knee Right;Left    Left Knee Extension -5   -41 deg. from full ext. in LAQ   Left Knee Flexion 127      Strength   Strength Assessment Site Knee;Hip    Right/Left Hip Right;Left    Right Hip Flexion 5/5    Right Hip Extension 5/5    Right Hip External Rotation  5/5    Right Hip Internal Rotation 5/5    Right Hip ABduction 4/5    Left Hip Flexion 4/5    Left Hip Extension 5/5    Left Hip External Rotation 3/5    Left Hip Internal Rotation 5/5    Left Hip ABduction 4-/5    Left Hip ADduction 3+/5      Flexibility   Soft Tissue Assessment /Muscle Length --   Tightness noted in L IT band; Fair to good quad flexibility     Palpation   Patella mobility --   Patella tracks laterally during quad contraction     Ambulation/Gait   Gait Comments --   Antalgic gait; ambulates independently                       Objective measurements completed on examination: See above findings.                PT Education - 01/15/21 9562     Education Details HEP, PT plan of care, Exam Findings    Person(s) Educated Patient    Methods Handout;Explanation;Demonstration;Verbal cues    Comprehension Verbalized understanding;Need further instruction              PT Short Term Goals - 01/15/21 0935       PT SHORT TERM GOAL #1   Title Assess single leg stance and write long term goal based on results    Time 2    Period Weeks    Status New    Target Date 01/29/21               PT Long Term Goals - 01/15/21 0936       PT LONG TERM GOAL #1   Title Report less than 3/10 pain with standing exercises and activities    Baseline --    Time 4    Period Weeks    Status New  Target Date 02/12/21      PT LONG TERM GOAL #2   Title Increase L hip strength to 4+/5 and L knee strength to 5/5    Baseline --    Time 6    Period Weeks    Status New    Target Date 02/26/21      PT LONG TERM GOAL #3   Title Decrease patella tracking laterally to no  more than mild    Baseline Currently moderately tracking    Time 6    Period Weeks    Target Date 02/26/21      PT LONG TERM GOAL #4   Title Increase FOTO score to 69%    Baseline Currently 53%    Time 6    Period Weeks    Status New    Target Date 02/26/21                    Plan - 01/15/21 [id number]     Clinical Impression Statement Pt. has a hx of chronic L knee pain, that is consistent with knee OA and previous patella dislocations.She has hip and knee weakness and lateral patella tracking. She would benefit from skilled physical therapy to address these functional deficits.    Personal Factors and Comorbidities Comorbidity 1    Comorbidities anemia,knee OA    Examination-Activity Limitations Locomotion Level;Squat;Stairs;Stand    Examination-Participation Restrictions Occupation;Community Activity;Shop    Stability/Clinical Decision Making Stable/Uncomplicated    Clinical Decision Making Low    Rehab Potential Good    PT Frequency 2x / week    PT Duration 6 weeks    PT Treatment/Interventions ADLs/Self Care Home Management;Cryotherapy;Electrical Stimulation;Moist Heat;Gait training;Ultrasound;Functional mobility training;Stair training;Therapeutic activities;Therapeutic exercise;Balance training;Neuromuscular re-education;Patient/family education;Manual techniques;Passive range of motion;Dry needling;Vasopneumatic Device;Joint Manipulations    PT Next Visit Plan Assess single leg balance, and hamstring flexibility. Reassess HEP.    PT Home Exercise Plan Access Code: [email or web address]    Consulted and Agree with Plan of Care Patient             Patient will benefit from skilled therapeutic intervention in order to improve the following deficits and impairments:  Decreased balance, Decreased mobility, Difficulty walking, Decreased range of motion, Pain, Decreased activity tolerance, Decreased strength, Increased edema, Impaired flexibility  Visit Diagnosis: Chronic pain of  left knee  Muscle weakness (generalized)  Difficulty in walking, not elsewhere classified     Problem List Patient Active Problem List   Diagnosis Date Noted   Pain in left knee 01/05/2021   Decreased visual acuity 09/25/2019   Lateral epicondylitis    Lateral epicondylitis, left elbow 05/09/2019   Effusion of left elbow 05/09/2019   Seasonal allergies 04/19/2019   Closed dislocation of left jaw 04/19/2019   Medial epicondylitis of left elbow 12/01/2018   Hemoglobin S-C disease (HCC)    Anemia    Vaginal Pap smear, abnormal    Headache    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

## 2021-01-21 ENCOUNTER — Encounter: Payer: Self-pay | Admitting: Physical Therapy

## 2021-01-21 ENCOUNTER — Other Ambulatory Visit: Payer: Self-pay

## 2021-01-21 ENCOUNTER — Ambulatory Visit (INDEPENDENT_AMBULATORY_CARE_PROVIDER_SITE_OTHER): Payer: Self-pay | Admitting: Physical Therapy

## 2021-01-21 DIAGNOSIS — M25562 Pain in left knee: Secondary | ICD-10-CM

## 2021-01-21 DIAGNOSIS — G8929 Other chronic pain: Secondary | ICD-10-CM

## 2021-01-21 DIAGNOSIS — R262 Difficulty in walking, not elsewhere classified: Secondary | ICD-10-CM

## 2021-01-21 DIAGNOSIS — M6281 Muscle weakness (generalized): Secondary | ICD-10-CM

## 2021-01-21 NOTE — Therapy (Signed)
[facility] Physical Therapy [address] [place], [place], [address] Phone: [phone]   Fax:  [phone]  Physical Therapy Treatment  Patient Details  Name: Taylor Roman MRN: [id number] Date of Birth: 07-20-1980 Referring Provider (PT): Cleophas Dunker   Encounter Date: 01/21/2021   PT End of Session - 01/21/21 0946     Visit Number 2    Number of Visits 12    Date for PT Re-Evaluation 02/26/21    PT Start Time 0848    PT Stop Time 0930    PT Time Calculation (min) 42 min    Activity Tolerance Patient tolerated treatment well    Behavior During Therapy [facility] for tasks assessed/performed             Past Medical History:  Diagnosis Date   Anemia    Has Hgb [place] with sickling   Headache    otc med prn   Hemoglobin S-C disease (HCC) 2013, 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  To have repeat pap in JUne 2018 at [facility]    Past Surgical History:  Procedure Laterality Date   CERVICAL CONIZATION W/BX N/A 11/03/2015   Procedure: CONIZATION CERVIX WITH BIOPSY;  Surgeon: Williamston Bing, MD;  Location: [facility];  Service: Gynecology;  Laterality: N/A;   HYSTEROSCOPY WITH D & C N/A 05/10/2017   Procedure: DILATATION AND CURETTAGE /HYSTEROSCOPY;  Surgeon: Conan Bowens, MD;  Location: [facility];  Service: Gynecology;  Laterality: N/A;    There were no vitals filed for this visit.   Subjective Assessment - 01/21/21 0851     Subjective Pt. states that knee is still at a 7/10 pain and hopefully as she continues to do the exercises it will begin to feel better. Pt. states that the pain around her achilles/ foot area has also remained the same and that she needs to "stretch it out" when standing for long periods at work.    Limitations Standing;Walking    How long can you stand comfortably? 15 minutes    How  long can you walk comfortably? 15 minutes    Patient Stated Goals Decrease knee pain while walking and standing.    Pain Onset More than a month ago                               [facility] Adult PT Treatment/Exercise - 01/21/21 0001       Exercises   Exercises Knee/Hip      Knee/Hip Exercises: Aerobic   Nustep Seat 12; Lvl 6.0; 7 minutes      Knee/Hip Exercises: Standing   Other Standing Knee Exercises Gastroc stretch; slant board; BOTH LE; 3 x 30sec.; Countertop heel raise, both UE support both LE.    Other Standing Knee Exercises R leg back L leg forward gastroc stretch at countertop both UE support 2 x 10; Countertop abduction and extension R LE only with red band at ankles 2 x 10, both UE support.      Knee/Hip Exercises: Supine   Other Supine Knee/Hip Exercises Straight leg raise with ER x 10 left only; glute bridges with ball squeeze in between legs 2 x 10; Hooklying ball squeezes betwen legs 5 sec hold 2 x 10;      Knee/Hip Exercises: Sidelying  Other Sidelying Knee/Hip Exercises --      Manual Therapy   Manual Therapy Passive ROM    Manual therapy comments Seated L leg passive flexion, R leg active extension simultaneously; Supine patella mobilizations medial and inferior glides L leg only (towel under knee), Towel under ankle supine passive extension of L knee.                       PT Short Term Goals - 01/15/21 0935       PT SHORT TERM GOAL #1   Title Assess single leg stance and write long term goal based on results    Time 2    Period Weeks    Status New    Target Date 01/29/21               PT Long Term Goals - 01/15/21 0936       PT LONG TERM GOAL #1   Title Report less than 3/10 pain with standing exercises and activities    Baseline --    Time 4    Period Weeks    Status New    Target Date 02/12/21      PT LONG TERM GOAL #2   Title Increase L hip strength to 4+/5 and L knee strength to 5/5    Baseline --     Time 6    Period Weeks    Status New    Target Date 02/26/21      PT LONG TERM GOAL #3   Title Decrease patella tracking laterally to no more than mild    Baseline Currently moderately tracking    Time 6    Period Weeks    Target Date 02/26/21      PT LONG TERM GOAL #4   Title Increase FOTO score to 69%    Baseline Currently 53%    Time 6    Period Weeks    Status New    Target Date 02/26/21                   Plan - 01/21/21 0947     Clinical Impression Statement Pt. was introduced to the Nustep to help facilitate motion to the knee. Pt. was able to demonstrate HEP, while cueing and modifying exercises for pt. comfort were provided. Pt. responded well to exercises and was engaged throughout the session. Joint mobilizations and passive stretching were performed with the pt. today to help address flexion, extension, and lateral tracking of the patella limitations that are present. Pt. was informed that soreness is common as a result of exercising the muscles and to apply ice or heat if needed.    Personal Factors and Comorbidities Comorbidity 1    Comorbidities anemia,knee OA    Examination-Activity Limitations Locomotion Level;Squat;Stairs;Stand    Examination-Participation Restrictions Occupation;Community Activity;Shop    Stability/Clinical Decision Making Stable/Uncomplicated    Rehab Potential Good    PT Frequency 2x / week    PT Duration 6 weeks    PT Treatment/Interventions ADLs/Self Care Home Management;Cryotherapy;Electrical Stimulation;Moist Heat;Gait training;Ultrasound;Functional mobility training;Stair training;Therapeutic activities;Therapeutic exercise;Balance training;Neuromuscular re-education;Patient/family education;Manual techniques;Passive range of motion;Dry needling;Vasopneumatic Device;Joint Manipulations    PT Next Visit Plan Assess pt. flexibility and balance. Progress in session exercises to address limitations.    PT Home Exercise Plan Access  Code: [email or web address]    Consulted and Agree with Plan of Care Patient             Patient  will benefit from skilled therapeutic intervention in order to improve the following deficits and impairments:  Decreased balance, Decreased mobility, Difficulty walking, Decreased range of motion, Pain, Decreased activity tolerance, Decreased strength, Increased edema, Impaired flexibility  Visit Diagnosis: Chronic pain of left knee  Muscle weakness (generalized)  Difficulty in walking, not elsewhere classified     Problem List Patient Active Problem List   Diagnosis Date Noted   Pain in left knee 01/05/2021   Decreased visual acuity 09/25/2019   Lateral epicondylitis    Lateral epicondylitis, left elbow 05/09/2019   Effusion of left elbow 05/09/2019   Seasonal allergies 04/19/2019   Closed dislocation of left jaw 04/19/2019   Medial epicondylitis of left elbow 12/01/2018   Hemoglobin S-C disease (HCC)    Anemia    Vaginal Pap smear, abnormal    Headache    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

## 2021-01-26 ENCOUNTER — Other Ambulatory Visit: Payer: Self-pay

## 2021-01-26 ENCOUNTER — Emergency Department (HOSPITAL_COMMUNITY)
Admission: EM | Admit: 2021-01-26 | Discharge: 2021-01-27 | Disposition: A | Discharge status: Home / Self Care | Payer: Self-pay | Attending: Student | Admitting: Student

## 2021-01-26 ENCOUNTER — Encounter (HOSPITAL_COMMUNITY): Payer: Self-pay | Admitting: Emergency Medicine

## 2021-01-26 DIAGNOSIS — N939 Abnormal uterine and vaginal bleeding, unspecified: Secondary | ICD-10-CM | POA: Insufficient documentation

## 2021-01-26 DIAGNOSIS — R102 Pelvic and perineal pain: Secondary | ICD-10-CM | POA: Insufficient documentation

## 2021-01-26 DIAGNOSIS — D509 Iron deficiency anemia, unspecified: Secondary | ICD-10-CM | POA: Insufficient documentation

## 2021-01-26 LAB — COMPREHENSIVE METABOLIC PANEL
ALT: 20 U/L (ref 0–44)
AST: 20 U/L (ref 15–41)
Albumin: 4.1 g/dL (ref 3.5–5.0)
Alkaline Phosphatase: 50 U/L (ref 38–126)
Anion gap: 7 (ref 5–15)
BUN: 5 mg/dL — ABNORMAL LOW (ref 6–20)
CO2: 23 mmol/L (ref 22–32)
Calcium: 8.7 mg/dL — ABNORMAL LOW (ref 8.9–10.3)
Chloride: 109 mmol/L (ref 98–111)
Creatinine, Ser: 0.7 mg/dL (ref 0.44–1.00)
GFR, Estimated: >60 mL/min (ref >60)
Glucose, Bld: 107 mg/dL — ABNORMAL HIGH (ref 70–99)
Potassium: 3.8 mmol/L (ref 3.5–5.1)
Sodium: 139 mmol/L (ref 135–145)
Total Bilirubin: 1 mg/dL (ref 0.3–1.2)
Total Protein: 7.2 g/dL (ref 6.5–8.1)

## 2021-01-26 LAB — URINALYSIS, ROUTINE W REFLEX MICROSCOPIC
Bacteria, UA: NONE SEEN
Bilirubin Urine: NEGATIVE
Glucose, UA: NEGATIVE mg/dL
Ketones, ur: NEGATIVE mg/dL
Leukocytes,Ua: NEGATIVE
Nitrite: NEGATIVE
Protein, ur: NEGATIVE mg/dL
RBC / HPF: >50 RBC/hpf — ABNORMAL HIGH (ref 0–5)
Specific Gravity, Urine: 1.01 (ref 1.005–1.030)
pH: 6 (ref 5.0–8.0)

## 2021-01-26 LAB — CBC WITH DIFFERENTIAL/PLATELET
Abs Immature Granulocytes: 0.02 10*3/uL (ref 0.00–0.07)
Basophils Absolute: 0.1 10*3/uL (ref 0.0–0.1)
Basophils Relative: 1 %
Eosinophils Absolute: 0.4 10*3/uL (ref 0.0–0.5)
Eosinophils Relative: 5 %
HCT: 24.7 % — ABNORMAL LOW (ref 36.0–46.0)
Hemoglobin: 9 g/dL — ABNORMAL LOW (ref 12.0–15.0)
Immature Granulocytes: 0 %
Lymphocytes Relative: 31 %
Lymphs Abs: 2.2 10*3/uL (ref 0.7–4.0)
MCH: 27.5 pg (ref 26.0–34.0)
MCHC: 36.4 g/dL — ABNORMAL HIGH (ref 30.0–36.0)
MCV: 75.5 fL — ABNORMAL LOW (ref 80.0–100.0)
Monocytes Absolute: 0.4 10*3/uL (ref 0.1–1.0)
Monocytes Relative: 5 %
Neutro Abs: 4 10*3/uL (ref 1.7–7.7)
Neutrophils Relative %: 58 %
Platelets: 206 10*3/uL (ref 150–400)
RBC: 3.27 MIL/uL — ABNORMAL LOW (ref 3.87–5.11)
RDW: 17.4 % — ABNORMAL HIGH (ref 11.5–15.5)
WBC: 7 10*3/uL (ref 4.0–10.5)
nRBC: 0 % (ref 0.0–0.2)

## 2021-01-26 LAB — LIPASE, BLOOD: Lipase: 25 U/L (ref 11–51)

## 2021-01-26 NOTE — ED Triage Notes (Signed)
Patient reports hypogastric pain with vaginal bleeding/spotting this morning , no emesis or diarrhea.

## 2021-01-26 NOTE — ED Provider Triage Note (Signed)
Emergency Medicine Provider Triage Evaluation Note  Taylor Roman , a 41 y.o. female  was evaluated in triage.  Pt complains of suprapubic pain associated with vaginal bleeding that started earlier today. No vaginal discharge. Denies urinary symptoms. Denies fever and chills. No nausea, vomiting, or diarrhea  Review of Systems  Positive: Abdominal pain, vaginal bleeding Negative: fever  Physical Exam  BP (!) 139/91 (BP Location: Right Arm)    Pulse (!) 57    Temp 98.7 F (37.1 C) (Oral)    Resp 20    Ht 5\' 5"  (1.651 m)    Wt 121 kg    SpO2 100%    BMI 44.39 kg/m  Gen:   Awake, no distress   Resp:  Normal effort  MSK:   Moves extremities without difficulty  Other:    Medical Decision Making  Medically screening exam initiated at 9:34 PM.  Appropriate orders placed.  Loucille Cozart was informed that the remainder of the evaluation will be completed by another provider, this initial triage assessment does not replace that evaluation, and the importance of remaining in the ED until their evaluation is complete.  labs   Karie Kirks 01/26/21 2135

## 2021-01-27 ENCOUNTER — Encounter: Payer: Self-pay | Admitting: Physical Therapy

## 2021-01-27 LAB — I-STAT BETA HCG BLOOD, ED (MC, WL, AP ONLY): I-stat hCG, quantitative: <5 m[IU]/mL (ref <5)

## 2021-01-27 NOTE — ED Notes (Signed)
Patient verbalizes understanding of discharge instructions. Opportunity for questioning and answers were provided. Armband removed by staff, pt discharged from ED ambulatory.   

## 2021-01-27 NOTE — Discharge Instructions (Addendum)
Please use Tylenol or ibuprofen for pain.  You may use 600 mg ibuprofen every 6 hours or 1000 mg of Tylenol every 6 hours.  You may choose to alternate between the 2.  This would be most effective.  Not to exceed 4 g of Tylenol within 24 hours.  Not to exceed 3200 mg ibuprofen 24 hours.  Recent prescription I believe that the pain that you are experiencing yesterday may be related to your menstrual cycle. It is reassuring that it has resolved at this time. If you have significantly worsening pain, new developing fever, nausea, vomiting, significant constipation or diarrhea in addition to the pelvic pain I recommend return to the emergency department for further evaluation.  Otherwise please follow-up with your primary care for further evaluation.

## 2021-01-27 NOTE — ED Provider Notes (Signed)
[facility] EMERGENCY DEPARTMENT Provider Note   CSN: [id number] Arrival date & time: 01/26/21  2111     History  Chief Complaint  Patient presents with   Abdominal Pain    Vaginal Bleeding    Taylor Roman is a 41 y.o. female with past medical history significant for sickle cell hemoglobin C, cervical dysplasia who presents with suprapubic pain, with vaginal bleeding, spotting this morning.  She denies nausea, vomiting, fever, chills, lightheadedness, dizziness, chest pain, shortness of breath, diarrhea, constipation.  Patient reports she does not normally have pelvic pain with menstrual cycles.   Abdominal Pain     Home Medications Prior to Admission medications   Medication Sig Start Date End Date Taking? Authorizing Provider  acetaminophen (TYLENOL) 500 MG tablet Take 500 mg by mouth every 6 (six) hours as needed for mild pain.    [provider]  Calcium Citrate-Vitamin D 250-200 MG-UNIT TABS 2 tabs by mouth twice daily. 09/25/20   Julieanne Manson, MD  cyclobenzaprine (FLEXERIL) 5 MG tablet 1 tab by mouth at bedtime as needed for back 09/25/20   Julieanne Manson, MD  dicyclomine (BENTYL) 20 MG tablet Take 1 tablet (20 mg total) by mouth 2 (two) times daily. 07/18/20   Jeannie Fend, PA-C  ferrous sulfate 325 (65 FE) MG tablet Take 325 mg by mouth daily with breakfast.    [provider]  folic acid (FOLVITE) 800 MCG tablet Take 400 mcg by mouth daily.    [provider]  ibuprofen (ADVIL) 800 MG tablet Take 1 tablet (800 mg total) by mouth every 6 (six) hours as needed for moderate pain. 01/05/20   Gilda Crease, MD  loratadine (CLARITIN) 10 MG tablet Take 1 tablet (10 mg total) by mouth daily. Patient not taking: Reported on 06/06/2019 04/19/19 06/06/19  Julieanne Manson, MD      Allergies    Patient has no known allergies.    Review of Systems   Review of Systems  Gastrointestinal:  Positive for abdominal pain.   All other systems reviewed and are negative.  Physical Exam Updated Vital Signs BP 126/74 (BP Location: Left Arm)    Pulse (!) 51    Temp 98.6 F (37 C) (Oral)    Resp 20    Ht 5\' 5"  (1.651 m)    Wt 121 kg    SpO2 100%    BMI 44.39 kg/m  Physical Exam Vitals and nursing note reviewed.  Constitutional:      General: She is not in acute distress.    Appearance: Normal appearance.  HENT:     Head: Normocephalic and atraumatic.  Eyes:     General:        Right eye: No discharge.        Left eye: No discharge.  Cardiovascular:     Rate and Rhythm: Normal rate and regular rhythm.     Heart sounds: No murmur heard.   No friction rub. No gallop.  Pulmonary:     Effort: Pulmonary effort is normal.     Breath sounds: Normal breath sounds.  Abdominal:     General: Bowel sounds are normal.     Palpations: Abdomen is soft.     Comments: No significant TTP throughout the abdomen  Skin:    General: Skin is warm and dry.     Capillary Refill: Capillary refill takes less than 2 seconds.  Neurological:     Mental Status: She is alert and oriented to  person, place, and time.  Psychiatric:        Mood and Affect: Mood normal.        Behavior: Behavior normal.    ED Results / Procedures / Treatments   Labs (all labs ordered are listed, but only abnormal results are displayed) Labs Reviewed  CBC WITH DIFFERENTIAL/PLATELET - Abnormal; Notable for the following components:      Result Value   RBC 3.27 (*)    Hemoglobin 9.0 (*)    HCT 24.7 (*)    MCV 75.5 (*)    MCHC 36.4 (*)    RDW 17.4 (*)    All other components within normal limits  COMPREHENSIVE METABOLIC PANEL - Abnormal; Notable for the following components:   Glucose, Bld 107 (*)    BUN 5 (*)    Calcium 8.7 (*)    All other components within normal limits  URINALYSIS, ROUTINE W REFLEX MICROSCOPIC - Abnormal; Notable for the following components:   Hgb urine dipstick LARGE (*)    RBC / HPF >50 (*)    All other components  within normal limits  LIPASE, BLOOD  I-STAT BETA HCG BLOOD, ED ([facility], [facility], [facility] ONLY)    EKG None  Radiology No results found.  Procedures Procedures    Medications Ordered in ED Medications - No data to display  ED Course/ Medical Decision Making/ A&P                           Medical Decision Making  I reviewed previous records including PCP, OB/GYN visits, previous ED visits.  Patient has significant comorbidities of sickle cell hemoglobin C, previous cervical dysplasia.  Patient provided the history herself.  This is an overall well-appearing patient who presents with pelvic pain x1 day with some vaginal bleeding, spotting.  She denies clotting, lightheadedness, nausea, vomiting.  She is afebrile.  At time of my interview patient reports that her pain is resolved with ibuprofen, time.  Reviewed her lab work which is significant for Urinalysis with large hemoglobin, no evidence of bacteria, nitrates, leukocytes, white blood cells.  This is consistent with patient who is currently on her menstrual cycle.  CBC significant for hemoglobin of 9.0, microcytic anemia.  This is stable compared to her baseline, last hemoglobin was 9.0.  CMP is unremarkable, glucose mildly elevated at 107.  Patient is not pregnant.  As her pain is resolved at this time I do not believe that patient requires further work-up, further imaging.  Encouraged her to continue to use I Profen, Tylenol, heating pad for pelvic pain related to menstrual cramping, if pain worsens or fails to improve despite over-the-counter analgesia recommend follow-up with the emergency department or her OB/GYN.  Patient understands agrees to plan, discharged in stable condition at this time. Final Clinical Impression(s) / ED Diagnoses Final diagnoses:  Pelvic pain    Rx / DC Orders ED Discharge Orders     None         [place] 01/27/21 1631    Glendora Score, MD 01/27/21 [phone]

## 2021-01-29 ENCOUNTER — Ambulatory Visit: Payer: Self-pay | Admitting: Internal Medicine

## 2021-01-29 ENCOUNTER — Encounter: Payer: Self-pay | Admitting: Physical Therapy

## 2021-02-04 ENCOUNTER — Ambulatory Visit: Payer: Self-pay | Admitting: Internal Medicine

## 2021-02-04 ENCOUNTER — Encounter: Payer: Self-pay | Admitting: Internal Medicine

## 2021-02-04 ENCOUNTER — Other Ambulatory Visit: Payer: Self-pay

## 2021-02-04 VITALS — BP 110/64 | HR 68 | Resp 12 | Ht 67.0 in | Wt 212.0 lb

## 2021-02-04 DIAGNOSIS — M545 Low back pain, unspecified: Secondary | ICD-10-CM

## 2021-02-04 DIAGNOSIS — G8929 Other chronic pain: Secondary | ICD-10-CM

## 2021-02-04 DIAGNOSIS — M25562 Pain in left knee: Secondary | ICD-10-CM

## 2021-02-04 DIAGNOSIS — D5720 Sickle-cell/Hb-C disease without crisis: Secondary | ICD-10-CM

## 2021-02-04 MED ORDER — CYCLOBENZAPRINE HCL 5 MG PO TABS: ORAL_TABLET | ORAL | 1 refills | Status: DC

## 2021-02-04 NOTE — Progress Notes (Signed)
Subjective:    Patient ID: Taylor Roman, female   DOB: [birth year], 41 y.o.   MRN: [id number]    HPI   Left Knee pain:  Followed by ortho, Dr. Cleophas Dunker.  has cartilage loss MRI scan of the left knee demonstrates high-grade partial-thickness cartilage loss of the lateral patellofemoral compartment with areas of full-thickness cartilage loss and subchondral reactive marrow edema in the lateral trochlear.  There was partial-thickness cartilage loss of the medial patellofemoral compartment.  Medial compartment demonstrated high-grade partial-thickness cartilage loss and partial-thickness cartilage loss of the lateral compartment with subchondral reactive changes.  There was a large knee joint effusion.  The TT to TG distance was 28 mm.  The patella apex was perched on the lateral trochlear.  No aggressive osseous lesions.   Getting PT and then contemplating corticosteroid injection, but too young for replacement at this time.  2.  Painful period without heavy bleeding:  was in ED for this on 01/26/21.  Was seen by Dr. Andrey Campanile, Hematology for Hemoglobin [place] and getting set up with application to pharmaceutical company for treatment with crizanlizumab.  He set her up with Gyn for this She has an appt on March 6th for pelvic U/S with Gyn at [facility] as well.    3.  HbSC Disease:  Dr. Andrey Campanile as above.  4.  Back Pain:  bilateral low back pain:  radiates up to upper thoracic at times.  Generally after standing for long periods of time. They are working on her back also with PT (focused on knee, but also back currently)  5.  Small areas of pigment loss on legs.  Was out in sun a lot when younger and [place] in [place]--hard to avoid.    Current Meds  Medication Sig   acetaminophen (TYLENOL) 500 MG tablet Take 500 mg by mouth every 6 (six) hours as needed for mild pain.   Calcium Citrate-Vitamin D 250-200 MG-UNIT TABS 2 tabs by mouth twice daily.   cyclobenzaprine (FLEXERIL) 5 MG tablet 1 tab by mouth at  bedtime as needed for back   folic acid (FOLVITE) 800 MCG tablet Take 400 mcg by mouth daily.   ibuprofen (ADVIL) 800 MG tablet Take 1 tablet (800 mg total) by mouth every 6 (six) hours as needed for moderate pain.   No Known Allergies   Review of Systems    Objective:   BP 110/64 (BP Location: Right Arm, Patient Position: Sitting, Cuff Size: Normal)   Pulse 68   Resp 12   Ht 5\' 7"  (1.702 m)   Wt 212 lb (96.2 kg)   LMP 01/26/2021 (Within Days)   BMI 33.20 kg/m   Physical Exam NAD Lungs:  CTA CV:  RRR without murmur or rub.  Radial and DP pulses normal and equal 2 mm spots of loss of pigment scattered over lower legs. Back exam normal--not having pain currently  Assessment & Plan    Chronic left knee pain:  working with Dr. 01/28/2021, ortho and PT with his clinic.  Has significant internal derangement and of patellofemoral compartment with large effusion on MR.  Planning for PT and then intra articular corticosteroid injection  To young for replacement.  Encouraged her to find water exercise for weight loss without causing more knee pain.    2.  Back pain:  no findings today.  Working on this with ortho as well.  Not clear how much may be related to Hgb [place] pain.  Cyclobenzaprine as needed.  3.  Hgb [place]:  has establishe with Hematology at [facility] for treatment.  4.  Hypopigmented lesions of legs:  appears to be sun related.

## 2021-02-05 ENCOUNTER — Ambulatory Visit (INDEPENDENT_AMBULATORY_CARE_PROVIDER_SITE_OTHER): Payer: Self-pay | Admitting: Physical Therapy

## 2021-02-05 ENCOUNTER — Encounter: Payer: Self-pay | Admitting: Physical Therapy

## 2021-02-05 DIAGNOSIS — G8929 Other chronic pain: Secondary | ICD-10-CM

## 2021-02-05 DIAGNOSIS — M25562 Pain in left knee: Secondary | ICD-10-CM

## 2021-02-05 DIAGNOSIS — R262 Difficulty in walking, not elsewhere classified: Secondary | ICD-10-CM

## 2021-02-05 DIAGNOSIS — M6281 Muscle weakness (generalized): Secondary | ICD-10-CM

## 2021-02-05 NOTE — Therapy (Addendum)
[facility] [address] [place], [place], [address] Phone: [phone]   Fax:  [phone]  Physical Therapy Treatment  Patient Details  Name: Taylor Roman MRN: [id number] Date of Birth: [birth year] Referring Provider (PT): Durward Fortes   Encounter Date: 02/05/2021   PT End of Session - 02/09/21 0821     Visit Number 3    Number of Visits 12    Date for PT Re-Evaluation 02/26/21    PT Start Time 0937    PT Stop Time 1015    PT Time Calculation (min) 38 min    Activity Tolerance Patient tolerated treatment well    Behavior During Therapy [facility] for tasks assessed/performed              Past Medical History:  Diagnosis Date   Anemia    Has Hgb [place] with sickling   Headache    otc med prn   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  To have repeat pap in JUne 2018 at [facility]    Past Surgical History:  Procedure Laterality Date   CERVICAL CONIZATION W/BX N/A 11/03/2015   Procedure: CONIZATION CERVIX WITH BIOPSY;  Surgeon: Aletha Halim, MD;  Location: [facility];  Service: Gynecology;  Laterality: N/A;   HYSTEROSCOPY WITH D & C N/A 05/10/2017   Procedure: DILATATION AND CURETTAGE /HYSTEROSCOPY;  Surgeon: Sloan Leiter, MD;  Location: [facility];  Service: Gynecology;  Laterality: N/A;    There were no vitals filed for this visit.   Subjective Assessment - 02/09/21 0822     Subjective Pt. states the L knee is feeling better today. She states that the L patella has been painful recently and it feels "out of place".    Limitations Standing;Walking    How long can you stand comfortably? 15 minutes    How long can you walk comfortably? 15 minutes    Patient Stated Goals Decrease knee pain while walking and standing.    Pain Onset More than a month ago                                 [facility] Adult PT Treatment/Exercise - 02/09/21 0001       Knee/Hip Exercises: Aerobic   Recumbent Bike Lvl 3, 6 minutes Seat 9      Knee/Hip Exercises: Standing   Other Standing Knee Exercises Gastroc stretch; slant board; BOTH LE; 3 x 30sec.; Countertop heel raise/ toe raise x 15, both UE support both LE.    Other Standing Knee Exercises Hip abduction and Extension bilat x 10 with 2# ankle weight at counter top 2 UE support      Knee/Hip Exercises: Supine   Other Supine Knee/Hip Exercises Straight leg raise, with quad contraction then raise x 10 left only; glute bridges with ball squeeze in between legs 2 x 10;      Manual Therapy   Manual Therapy Passive ROM    Manual therapy comments distraction of left knee in sitting    Joint Mobilization Supine patella mobilizations medial and inferior glides L leg only (towel under knee),    Passive ROM Supine passive flexion of L knee  PT Short Term Goals - 01/15/21 0935       PT SHORT TERM GOAL #1   Title Assess single leg stance and write long term goal based on results    Time 2    Period Weeks    Status New    Target Date 01/29/21               PT Long Term Goals - 01/15/21 0936       PT LONG TERM GOAL #1   Title Report less than 3/10 pain with standing exercises and activities    Baseline --    Time 4    Period Weeks    Status New    Target Date 02/12/21      PT LONG TERM GOAL #2   Title Increase L hip strength to 4+/5 and L knee strength to 5/5    Baseline --    Time 6    Period Weeks    Status New    Target Date 02/26/21      PT LONG TERM GOAL #3   Title Decrease patella tracking laterally to no more than mild    Baseline Currently moderately tracking    Time 6    Period Weeks    Target Date 02/26/21      PT LONG TERM GOAL #4   Title Increase FOTO score to 69%    Baseline Currently 53%    Time 6    Period Weeks     Status New    Target Date 02/26/21                   Plan - 02/09/21 0823     Clinical Impression Statement Pt. tolerated session well. Session focused on strengthening knee flexor and extensor muscles to assist with lateral tracking of the patella, and pain from the knee cap. This included mobilizations of the patella and emphasizing quad contraction exercises the pt. will add to her HEP.    Personal Factors and Comorbidities Comorbidity 1    Comorbidities anemia,knee OA    Examination-Activity Limitations Locomotion Level;Squat;Stairs;Stand    Examination-Participation Restrictions Occupation;Community Activity;Shop    Stability/Clinical Decision Making Stable/Uncomplicated    Rehab Potential Good    PT Frequency 2x / week    PT Duration 6 weeks    PT Treatment/Interventions ADLs/Self Care Home Management;Cryotherapy;Electrical Stimulation;Moist Heat;Gait training;Ultrasound;Functional mobility training;Stair training;Therapeutic activities;Therapeutic exercise;Balance training;Neuromuscular re-education;Patient/family education;Manual techniques;Passive range of motion;Dry needling;Vasopneumatic Device;Joint Manipulations    PT Next Visit Plan Progress exercises, focus on preventing lateral tracking of patella    PT Home Exercise Plan Access Code: [email or web address]    Consulted and Agree with Plan of Care Patient              Patient will benefit from skilled therapeutic intervention in order to improve the following deficits and impairments:  Decreased balance, Decreased mobility, Difficulty walking, Decreased range of motion, Pain, Decreased activity tolerance, Decreased strength, Increased edema, Impaired flexibility  Visit Diagnosis: Chronic pain of left knee  Muscle weakness (generalized)  Difficulty in walking, not elsewhere classified     Problem List Patient Active Problem List   Diagnosis Date Noted   Pain in left knee 01/05/2021   Decreased visual acuity  09/25/2019   Lateral epicondylitis    Lateral epicondylitis, left elbow 05/09/2019   Effusion of left elbow 05/09/2019   Seasonal allergies 04/19/2019   Closed dislocation of left jaw 04/19/2019   Medial epicondylitis of left elbow 12/01/2018  Hemoglobin S-C disease ([place])    Anemia    Vaginal Pap smear, abnormal    Headache    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

## 2021-02-12 ENCOUNTER — Other Ambulatory Visit: Payer: Self-pay

## 2021-02-12 ENCOUNTER — Encounter: Payer: Self-pay | Admitting: Physical Therapy

## 2021-02-12 ENCOUNTER — Ambulatory Visit (INDEPENDENT_AMBULATORY_CARE_PROVIDER_SITE_OTHER): Payer: Self-pay | Admitting: Physical Therapy

## 2021-02-12 DIAGNOSIS — M25562 Pain in left knee: Secondary | ICD-10-CM

## 2021-02-12 DIAGNOSIS — G8929 Other chronic pain: Secondary | ICD-10-CM

## 2021-02-12 DIAGNOSIS — M6281 Muscle weakness (generalized): Secondary | ICD-10-CM

## 2021-02-12 DIAGNOSIS — R262 Difficulty in walking, not elsewhere classified: Secondary | ICD-10-CM

## 2021-02-12 NOTE — Therapy (Signed)
[facility] [address] [place], [place], [address] Phone: [phone]   Fax:  [phone]  Physical Therapy Treatment  Patient Details  Name: Taylor Roman MRN: [id number] Date of Birth: May 13, 1980 Referring Provider (PT): Cleophas Dunker   Encounter Date: 02/12/2021   PT End of Session - 02/12/21 1030     Visit Number 4    Number of Visits 12    Date for PT Re-Evaluation 02/26/21    PT Start Time 0939    PT Stop Time 1021    PT Time Calculation (min) 42 min    Activity Tolerance Patient tolerated treatment well    Behavior During Therapy [facility] for tasks assessed/performed             Past Medical History:  Diagnosis Date   Anemia    Has Hgb [place] with sickling   Headache    otc med prn   Hemoglobin S-C disease ([facility]) 2013, 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  To have repeat pap in JUne 2018 at [facility]    Past Surgical History:  Procedure Laterality Date   CERVICAL CONIZATION W/BX N/A 11/03/2015   Procedure: CONIZATION CERVIX WITH BIOPSY;  Surgeon: Marshall Bing, MD;  Location: [facility];  Service: Gynecology;  Laterality: N/A;   HYSTEROSCOPY WITH D & C N/A 05/10/2017   Procedure: DILATATION AND CURETTAGE /HYSTEROSCOPY;  Surgeon: Conan Bowens, MD;  Location: [facility];  Service: Gynecology;  Laterality: N/A;    There were no vitals filed for this visit.   Subjective Assessment - 02/12/21 0948     Subjective Pt. states the L knee is 6/10 pain today. "3 days ago it felt like it wants to pop out place"    Limitations Standing;Walking    How long can you stand comfortably? 15 minutes    How long can you walk comfortably? 15 minutes    Patient Stated Goals Decrease knee pain while walking and standing.    Pain Onset More than a month ago    Aggravating Factors  standng  and  walking    Pain Relieving Factors rest                               [facility] Adult PT Treatment/Exercise - 02/12/21 0001       Knee/Hip Exercises: Stretches   ITB Stretch Left;3 reps;30 seconds    ITB Stretch Limitations supine with strap    Gastroc Stretch Both;3 reps;30 seconds    Gastroc Stretch Limitations slantboard      Knee/Hip Exercises: Aerobic   Recumbent Bike Lvl 4, 6 minutes Seat 9      Knee/Hip Exercises: Machines for Strengthening   Total Gym Leg Press DL 102# 5E52, then left leg only 50# X10      Knee/Hip Exercises: Standing   Lateral Step Up Both;10 reps;Hand Hold: 2;Step Height: 6"    Forward Step Up Left;10 reps;Hand Hold: 1;Step Height: 6"    Other Standing Knee Exercises hip 4 way on left with red X 15 ea      Knee/Hip Exercises: Seated   Long Arc Quad Left    Long Arc Quad Weight 0 lbs.   attempted but too painful   Long Arc Quad Limitations stopped at 3 reps  due to pain    Ball Squeeze 5 sec X15      Manual Therapy   Manual therapy comments KT tape for left knee to prevent lateral patella tracking (2 medium I strips vertical with no stretch on lateral and 25% stretch on medial then 2 short I strips horizontal at 50% stretch directed medially)                       PT Short Term Goals - 01/15/21 0935       PT SHORT TERM GOAL #1   Title Assess single leg stance and write long term goal based on results    Time 2    Period Weeks    Status New    Target Date 01/29/21               PT Long Term Goals - 01/15/21 0936       PT LONG TERM GOAL #1   Title Report less than 3/10 pain with standing exercises and activities    Baseline --    Time 4    Period Weeks    Status New    Target Date 02/12/21      PT LONG TERM GOAL #2   Title Increase L hip strength to 4+/5 and L knee strength to 5/5    Baseline --    Time 6    Period Weeks    Status New    Target Date 02/26/21      PT LONG TERM GOAL #3   Title  Decrease patella tracking laterally to no more than mild    Baseline Currently moderately tracking    Time 6    Period Weeks    Target Date 02/26/21      PT LONG TERM GOAL #4   Title Increase FOTO score to 69%    Baseline Currently 53%    Time 6    Period Weeks    Status New    Target Date 02/26/21                   Plan - 02/12/21 1032     Clinical Impression Statement We continued to focus on Lt knee/hip strengthening to her tolerance due to continued weakness noted.  I did trial KT tape today to see if this will help reduce lateral patella tracking and provide some support for her patella which may also help with pain.    Personal Factors and Comorbidities Comorbidity 1    Comorbidities anemia,knee OA    Examination-Activity Limitations Locomotion Level;Squat;Stairs;Stand    Examination-Participation Restrictions Occupation;Community Activity;Shop    Stability/Clinical Decision Making Stable/Uncomplicated    Rehab Potential Good    PT Frequency 2x / week    PT Duration 6 weeks    PT Treatment/Interventions ADLs/Self Care Home Management;Cryotherapy;Electrical Stimulation;Moist Heat;Gait training;Ultrasound;Functional mobility training;Stair training;Therapeutic activities;Therapeutic exercise;Balance training;Neuromuscular re-education;Patient/family education;Manual techniques;Passive range of motion;Dry needling;Vasopneumatic Device;Joint Manipulations    PT Next Visit Plan how was KT tape? peform SLS to address STG #1. Progress exercises, focus on preventing lateral tracking of patella    PT Home Exercise Plan Access Code: [email or web address]    Consulted and Agree with Plan of Care Patient             Patient will benefit from skilled therapeutic intervention in order to improve the following deficits and impairments:  Decreased balance, Decreased mobility, Difficulty walking, Decreased range of motion, Pain, Decreased activity tolerance, Decreased strength, Increased  edema, Impaired flexibility  Visit Diagnosis: Chronic pain of left knee  Muscle weakness (generalized)  Difficulty in walking, not elsewhere classified     Problem List Patient Active Problem List   Diagnosis Date Noted   Pain in left knee 01/05/2021   Decreased visual acuity 09/25/2019   Lateral epicondylitis    Lateral epicondylitis, left elbow 05/09/2019   Effusion of left elbow 05/09/2019   Seasonal allergies 04/19/2019   Closed dislocation of left jaw 04/19/2019   Medial epicondylitis of left elbow 12/01/2018   Hemoglobin S-C disease (HCC)    Anemia    Vaginal Pap smear, abnormal    Headache    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 [place], [place], [address] Phone: [phone]   Fax:  [phone]  Name: Eve Elkind MRN: [id number] Date of Birth: 27-Mar-1980

## 2021-02-19 ENCOUNTER — Encounter: Payer: Self-pay | Admitting: Physical Therapy

## 2021-02-19 ENCOUNTER — Other Ambulatory Visit: Payer: Self-pay

## 2021-02-19 ENCOUNTER — Ambulatory Visit (INDEPENDENT_AMBULATORY_CARE_PROVIDER_SITE_OTHER): Payer: Self-pay | Admitting: Physical Therapy

## 2021-02-19 DIAGNOSIS — M6281 Muscle weakness (generalized): Secondary | ICD-10-CM

## 2021-02-19 DIAGNOSIS — R262 Difficulty in walking, not elsewhere classified: Secondary | ICD-10-CM

## 2021-02-19 DIAGNOSIS — G8929 Other chronic pain: Secondary | ICD-10-CM

## 2021-02-19 DIAGNOSIS — M25562 Pain in left knee: Secondary | ICD-10-CM

## 2021-02-19 NOTE — Therapy (Signed)
[facility] [address] [place], [place], [address] Phone: [phone]   Fax:  [phone]  Physical Therapy Treatment  Patient Details  Name: Taylor Roman MRN: [id number] Date of Birth: [birth year] Referring Provider (PT): Durward Fortes   Encounter Date: 02/19/2021   PT End of Session - 02/19/21 1001     Visit Number 5    Number of Visits 12    Date for PT Re-Evaluation 02/26/21    PT Start Time 0930    PT Stop Time [id number]    PT Time Calculation (min) 45 min    Activity Tolerance Patient tolerated treatment well    Behavior During Therapy [facility] for tasks assessed/performed             Past Medical History:  Diagnosis Date   Anemia    Has Hgb [place] with sickling   Headache    otc med prn   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  To have repeat pap in JUne 2018 at [facility]    Past Surgical History:  Procedure Laterality Date   CERVICAL CONIZATION W/BX N/A 11/03/2015   Procedure: CONIZATION CERVIX WITH BIOPSY;  Surgeon: Aletha Halim, MD;  Location: [facility];  Service: Gynecology;  Laterality: N/A;   HYSTEROSCOPY WITH D & C N/A 05/10/2017   Procedure: DILATATION AND CURETTAGE /HYSTEROSCOPY;  Surgeon: Sloan Leiter, MD;  Location: [place];  Service: Gynecology;  Laterality: N/A;    There were no vitals filed for this visit.   Subjective Assessment - 02/19/21 0938     Subjective Pt. states she was very sore in the Lt knee after the last visit, however the soreness has subsided at this point. Pt. states that the KT tape used at last visit helped with her knee pain, and that the pain today is about a 5/10.    Limitations Standing;Walking    How long can you stand comfortably? 15 minutes    How long can you walk comfortably? 15 minutes    Patient  Stated Goals Decrease knee pain while walking and standing.    Pain Onset More than a month ago                [facility] PT Assessment - 02/19/21 0001       Balance   Balance Assessed Yes      Static Standing Balance   Static Standing - Balance Support No upper extremity supported    Static Standing - Level of Assistance 7: Independent    Static Standing Balance -  Activities  Single Leg Stance - Right Leg;Single Leg Stance - Left Leg    Static Standing - Comment/# of Minutes 30 sec. bilat                           [facility] Adult PT Treatment/Exercise - 02/19/21 0001       Knee/Hip Exercises: Stretches   Passive Hamstring Stretch Left;30 seconds;2 reps    Passive Hamstring Stretch Limitations supine with strap    Knee: Self-Stretch to increase Flexion Left;3 reps;30 seconds    Knee: Self-Stretch Limitations supine with strap (heel slides)      Knee/Hip Exercises: Aerobic   Nustep Seat 12; Lvl 6.0; 8 minutes  Knee/Hip Exercises: Standing   Lateral Step Up 20 reps;Hand Hold: 2;Step Height: 6";Other (comment)   Up with Lt down with Rt   Lateral Step Up Limitations In parallel bars    Forward Step Up 20 reps;Hand Hold: 0;Step Height: 6";Other (comment)   Up with L down with R   Forward Step Up Limitations in parallel bars    Other Standing Knee Exercises Gastroc stretch; slant board; BOTH LE; 2 x 30sec.; Soleus stretch slant board; BOTH LE 2 x 30 sec. Parallel bars heel raise/ toe raise x 15, both UE support both LE. Parallel bars mini squats x 15, both UE support.                       PT Short Term Goals - 02/19/21 1125       PT SHORT TERM GOAL #1   Title Assess single leg stance and write long term goal based on results    Baseline Achieved 30 s bilat SLS    Time 2    Period Weeks    Status Achieved    Target Date 01/29/21               PT Long Term Goals - 02/19/21 1124       PT LONG TERM GOAL #1   Title Report less than  3/10 pain with standing exercises and activities    Time 4    Period Weeks    Status New    Target Date 02/12/21      PT LONG TERM GOAL #2   Title Increase L hip strength to 4+/5 and L knee strength to 5/5    Time 6    Period Weeks    Status New    Target Date 02/26/21      PT LONG TERM GOAL #3   Title Decrease patella tracking laterally to no more than mild    Baseline Currently moderately tracking    Time 6    Period Weeks    Target Date 02/26/21      PT LONG TERM GOAL #4   Title Increase FOTO score to 69%    Baseline Currently 53%    Time 6    Period Weeks    Status New    Target Date 02/26/21                   Plan - 02/19/21 1001     Clinical Impression Statement Session focused on Lt knee/ hip ROM, strengthening and stair navigation. We also assessed pt's SLS time for functional endurance, and pt. was WNL for this time which demonstrates strength progression in the LE's. Pt. deferred KT taping at this session, and we will readdress this modality at next session.    Personal Factors and Comorbidities Comorbidity 1    Comorbidities anemia,knee OA    Examination-Activity Limitations Locomotion Level;Squat;Stairs;Stand    Examination-Participation Restrictions Occupation;Community Activity;Shop    Stability/Clinical Decision Making Stable/Uncomplicated    Rehab Potential Good    PT Frequency 2x / week    PT Duration 6 weeks    PT Treatment/Interventions ADLs/Self Care Home Management;Cryotherapy;Electrical Stimulation;Moist Heat;Gait training;Ultrasound;Functional mobility training;Stair training;Therapeutic activities;Therapeutic exercise;Balance training;Neuromuscular re-education;Patient/family education;Manual techniques;Passive range of motion;Dry needling;Vasopneumatic Device;Joint Manipulations    PT Next Visit Plan Emphasize flexion deficits of Lt knee, and preventing lateral tracking of the patella, Ask about KT tape    PT Home Exercise Plan Access Code:  [email or web address]    Consulted  and Agree with Plan of Care Patient             Patient will benefit from skilled therapeutic intervention in order to improve the following deficits and impairments:  Decreased balance, Decreased mobility, Difficulty walking, Decreased range of motion, Pain, Decreased activity tolerance, Decreased strength, Increased edema, Impaired flexibility  Visit Diagnosis: Chronic pain of left knee  Muscle weakness (generalized)  Difficulty in walking, not elsewhere classified     Problem List Patient Active Problem List   Diagnosis Date Noted   Pain in left knee 01/05/2021   Decreased visual acuity 09/25/2019   Lateral epicondylitis    Lateral epicondylitis, left elbow 05/09/2019   Effusion of left elbow 05/09/2019   Seasonal allergies 04/19/2019   Closed dislocation of left jaw 04/19/2019   Medial epicondylitis of left elbow 12/01/2018   Hemoglobin S-C disease ([place])    Anemia    Vaginal Pap smear, abnormal    Headache    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 [place], [place], [address] Phone: [phone]   Fax:  [phone]  Name: Taylor Roman MRN: [id number] Date of Birth: 04/16/1980

## 2021-02-24 ENCOUNTER — Telehealth: Payer: Self-pay | Admitting: Physical Therapy

## 2021-02-24 NOTE — Telephone Encounter (Signed)
Called pt to r/s PT appt.

## 2021-02-26 ENCOUNTER — Encounter: Payer: Self-pay | Admitting: Physical Therapy

## 2021-03-05 ENCOUNTER — Other Ambulatory Visit: Payer: Self-pay

## 2021-03-05 ENCOUNTER — Encounter: Payer: Self-pay | Admitting: Physical Therapy

## 2021-03-05 ENCOUNTER — Ambulatory Visit (INDEPENDENT_AMBULATORY_CARE_PROVIDER_SITE_OTHER): Payer: Self-pay | Admitting: Physical Therapy

## 2021-03-05 DIAGNOSIS — R262 Difficulty in walking, not elsewhere classified: Secondary | ICD-10-CM

## 2021-03-05 DIAGNOSIS — G8929 Other chronic pain: Secondary | ICD-10-CM

## 2021-03-05 DIAGNOSIS — M6281 Muscle weakness (generalized): Secondary | ICD-10-CM

## 2021-03-05 DIAGNOSIS — M25562 Pain in left knee: Secondary | ICD-10-CM

## 2021-03-05 NOTE — Therapy (Signed)
[facility] ?[address] ?[place], [place], [address] ?Phone: [phone]   Fax:  [phone] ? ?Physical Therapy Treatment/Recert ? ? ?Patient Details  ?Name: Taylor Roman ?MRN: [id number] ?Date of Birth: [birth year] ?Referring Provider (PT): Durward Fortes ? ? ?Encounter Date: 03/05/2021 ? ? PT End of Session - 03/05/21 1029   ? ? Visit Number 6   ? Number of Visits 12   ? Date for PT Re-Evaluation 04/02/21   ? PT Start Time [phone]   ? PT Stop Time 1022   ? PT Time Calculation (min) 44 min   ? Activity Tolerance Patient tolerated treatment well   ? Behavior During Therapy [facility] for tasks assessed/performed   ? ?  ?  ? ?  ? ? ?Past Medical History:  ?Diagnosis Date  ? Anemia   ? Has Hgb  with sickling  ? Headache   ? otc med prn  ? 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 Trait for both  ? Lateral epicondylitis   ? TMJ (dislocation of temporomandibular joint)   ? Recurrent  ? Vaginal Pap smear, abnormal   ? 10/2015 cervical conization with biopsy.  To have repeat pap in JUne 2018 at [facility]  ? ? ?Past Surgical History:  ?Procedure Laterality Date  ? CERVICAL CONIZATION W/BX N/A 11/03/2015  ? Procedure: CONIZATION CERVIX WITH BIOPSY;  Surgeon: Aletha Halim, MD;  Location: [facility];  Service: Gynecology;  Laterality: N/A;  ? HYSTEROSCOPY WITH D & C N/A 05/10/2017  ? Procedure: DILATATION AND CURETTAGE /HYSTEROSCOPY;  Surgeon: Sloan Leiter, MD;  Location: [facility];  Service: Gynecology;  Laterality: N/A;  ? ? ?There were no vitals filed for this visit. ? ? Subjective Assessment - 03/05/21 0941   ? ? Subjective Pt reports that the knee pain is mild today. She notes increased pain towards the heel of her foot especially when standing.   ? Limitations Standing;Walking   ? How long can you stand comfortably? 15 minutes   ? How long can you walk comfortably? 15 minutes   ? Patient Stated Goals Decrease knee pain while walking and standing.   ? Pain Onset More than a month ago    ? ?  ?  ? ?  ? ? ? ? ? [facility] PT Assessment - 03/05/21 0001   ? ?  ? Assessment  ? Medical Diagnosis Lt knee OA, previous patella dislocation   ? Referring Provider (PT) Durward Fortes   ? Onset Date/Surgical Date 01/25/20   ? Next MD Visit N/A   ? Prior Therapy Yes, for elbow   ?  ? Observation/Other Assessments  ? Focus on Therapeutic Outcomes (FOTO)  49%   ?  ? AROM  ? Left Knee Extension -3   ? Left Knee Flexion 132   ?  ? Strength  ? Left Hip Flexion 4+/5   ? Left Hip Extension 5/5   ? Left Hip External Rotation 4+/5   ? Left Hip Internal Rotation 5/5   ? Left Hip ABduction 4/5   ? Left Hip ADduction 3+/5   ? ?  ?  ? ?  ? ? ? ? ? ? ? ? ? ? ? ? ? ? ? ? [facility] Adult PT Treatment/Exercise - 03/05/21 0001   ? ?  ? Knee/Hip Exercises: Stretches  ? Gastroc Stretch Left;3 reps;30 seconds   ? Gastroc Stretch Limitations Against wall   ? Other Knee/Hip Stretches Lt LE- Gastroc/ Soleus stretch with towel in  long sitting 3 x 30sec; Big toe extension stretch 2 x 30 sec; Plantar fascia release with tennis ball 2 x 30 sec   ?  ? Knee/Hip Exercises: Aerobic  ? Recumbent Bike Lvl 4, 6 minutes Seat 9   ?  ? Knee/Hip Exercises: Seated  ? Knee/Hip Flexion Lt LE Seated SLR with quad contraction x 8   ? ?  ?  ? ?  ? ? ? ? ? ? ? ? ? ? ? ? PT Short Term Goals - 02/19/21 1125   ? ?  ? PT SHORT TERM GOAL #1  ? Title Assess single leg stance and write long term goal based on results   ? Baseline Achieved 30 s bilat SLS   ? Time 2   ? Period Weeks   ? Status Achieved   ? Target Date 01/29/21   ? ?  ?  ? ?  ? ? ? ? PT Long Term Goals - 03/05/21 1002   ? ?  ? PT LONG TERM GOAL #1  ? Title Report less than 3/10 pain with standing exercises and activities   ? Baseline 5/10 currently   ? Time 4   ? Period Weeks   ? Status On-going   ? Target Date 04/02/21   ?  ? PT LONG TERM GOAL #2  ? Title Increase L hip strength to 4+/5 and L knee strength to 5/5   ? Baseline Except for left hip adduction and abduction   ? Time 6   ? Period Weeks   ? Status  Partially Met   ? Target Date 04/02/21   ?  ? PT LONG TERM GOAL #3  ? Title Decrease patella tracking laterally to no more than mild   ? Baseline Patellar dislocation present 03/05/21   ? Time 6   ? Period Weeks   ? Status On-going   ? Target Date 04/02/21   ?  ? PT LONG TERM GOAL #4  ? Title Increase FOTO score to 69%   ? Baseline Currently 49%   ? Time 6   ? Period Weeks   ? Status On-going   ? Target Date 04/02/21   ? ?  ?  ? ?  ? ? ? ? ? ? ? ? Plan - 03/05/21 1030   ? ? Clinical Impression Statement Session focused on reassessing goals, FOTO score and Lt knee/ hip measurements. As well as, addressing concerns of Lt heel pain and patella tracking. Pt displayed sx consistent with plantar fascitis and we reviewed exercises to assist with this pain. Pt tolerated these exercises well, and we will continue to reassess this pain at her next visits. Significant Lt patellar dislocation and relocation was noted with Lt knee extension, with concordant pain felt by the pt. We instructed pt on exercises to help facilitate proper tracking of the patellar. We also discussed with pt that if no progress continues to be made, as seen with a decreased FOTO score since her first visit, she may need to return to her MD to discuss additional interventions that can address this concern. We will reassess with pt after 4 more weeks of PT.   ? Personal Factors and Comorbidities Comorbidity 1   ? Comorbidities anemia,knee OA   ? Examination-Activity Limitations Locomotion Level;Squat;Stairs;Stand   ? Examination-Participation Restrictions Occupation;Community Activity;Shop   ? Stability/Clinical Decision Making Stable/Uncomplicated   ? Rehab Potential Good   ? PT Frequency 2x / week   ? PT Duration 6  weeks   ? PT Treatment/Interventions ADLs/Self Care Home Management;Cryotherapy;Electrical Stimulation;Moist Heat;Gait training;Ultrasound;Functional mobility training;Stair training;Therapeutic activities;Therapeutic exercise;Balance  training;Neuromuscular re-education;Patient/family education;Manual techniques;Passive range of motion;Dry needling;Vasopneumatic Device;Joint Manipulations   ? PT Next Visit Plan Reassess HEP, continue to progress Lt knee strenghtening exercises to prevent lateral tracking of patella   ? PT Home Exercise Plan Access Code: [email or web address]   ? Consulted and Agree with Plan of Care Patient   ? ?  ?  ? ?  ? ? ?Patient will benefit from skilled therapeutic intervention in order to improve the following deficits and impairments:  Decreased balance, Decreased mobility, Difficulty walking, Decreased range of motion, Pain, Decreased activity tolerance, Decreased strength, Increased edema, Impaired flexibility ? ?Visit Diagnosis: ?Chronic pain of left knee ? ?Muscle weakness (generalized) ? ?Difficulty in walking, not elsewhere classified ? ? ? ? ?Problem List ?Patient Active Problem List  ? Diagnosis Date Noted  ? Pain in left knee 01/05/2021  ? Decreased visual acuity 09/25/2019  ? Lateral epicondylitis   ? Lateral epicondylitis, left elbow 05/09/2019  ? Effusion of left elbow 05/09/2019  ? Seasonal allergies 04/19/2019  ? Closed dislocation of left jaw 04/19/2019  ? Medial epicondylitis of left elbow 12/01/2018  ? Hemoglobin S-C disease ([place])   ? Anemia   ? Vaginal Pap smear, abnormal   ? Headache   ? 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 ? ?Kailana Benninger Singer, Student-PT ?03/05/2021, 10:49 AM ? ?[facility] ?[address] ?[place], [place], [address] ?Phone: [phone]   Fax:  [phone] ? ?Name: Taylor Roman ?MRN: [id number] ?Date of Birth: 02-15-1980 ? ? ? ?

## 2021-03-26 ENCOUNTER — Encounter: Payer: Self-pay | Admitting: Rehabilitative and Restorative Service Providers"

## 2021-03-26 ENCOUNTER — Other Ambulatory Visit: Payer: Self-pay

## 2021-03-26 ENCOUNTER — Ambulatory Visit (INDEPENDENT_AMBULATORY_CARE_PROVIDER_SITE_OTHER): Payer: Self-pay | Admitting: Rehabilitative and Restorative Service Providers"

## 2021-03-26 DIAGNOSIS — G8929 Other chronic pain: Secondary | ICD-10-CM

## 2021-03-26 DIAGNOSIS — M6281 Muscle weakness (generalized): Secondary | ICD-10-CM

## 2021-03-26 DIAGNOSIS — R262 Difficulty in walking, not elsewhere classified: Secondary | ICD-10-CM

## 2021-03-26 DIAGNOSIS — M25562 Pain in left knee: Secondary | ICD-10-CM

## 2021-03-26 NOTE — Therapy (Signed)
[facility] ?[address] ?[place], [place], [address] ?Phone: [phone]   Fax:  [phone] ? ?Physical Therapy Treatment ? ?Patient Details  ?Name: Taylor Roman ?MRN: [id number] ?Date of Birth: Dec 28, 1980 ?Referring Provider (PT): Durward Fortes ? ? ?Encounter Date: 03/26/2021 ? ? PT End of Session - 03/26/21 1211   ? ? Visit Number 7   ? Number of Visits 12   ? Date for PT Re-Evaluation 04/02/21   ? PT Start Time 1015   ? PT Stop Time 1058   ? PT Time Calculation (min) 43 min   ? Activity Tolerance Patient tolerated treatment well   ? Behavior During Therapy [facility] for tasks assessed/performed   ? ?  ?  ? ?  ? ? ?Past Medical History:  ?Diagnosis Date  ? Anemia   ? Has Hgb [place] with sickling  ? Headache   ? otc med prn  ? 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 Trait for both  ? Lateral epicondylitis   ? TMJ (dislocation of temporomandibular joint)   ? Recurrent  ? Vaginal Pap smear, abnormal   ? 10/2015 cervical conization with biopsy.  To have repeat [facility] in JUne 2018 [facility]  ? ? ?Past Surgical History:  ?Procedure Laterality Date  ? CERVICAL CONIZATION W/BX N/A 11/03/2015  ? Procedure: CONIZATION CERVIX WITH BIOPSY;  Surgeon: Aletha Halim, MD;  Location: [facility];  Service: Gynecology;  Laterality: N/A;  ? HYSTEROSCOPY WITH D & C N/A 05/10/2017  ? Procedure: DILATATION AND CURETTAGE /HYSTEROSCOPY;  Surgeon: Sloan Leiter, MD;  Location: [facility];  Service: Gynecology;  Laterality: N/A;  ? ? ?There were no vitals filed for this visit. ? ? Subjective Assessment - 03/26/21 1212   ? ? Subjective Sharmayne reports she has been doing her exercises.  Her knee and foot have been giving her a hard time.  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   ? Limitations Standing;Walking   ? How long can you stand comfortably? 15 minutes   ? How long can you walk comfortably? 15 minutes   ? Patient Stated Goals Decrease  knee pain while walking and standing.   ? Currently in Pain? Yes   ? Pain Score 5    ? Pain Location Foot   ? Pain Orientation Left   ? Pain Descriptors / Indicators Aching;Constant;Sharp   ? Pain Type Chronic pain   ? Pain Onset More than a month ago   ? Pain Frequency Constant   ? Aggravating Factors  WB   ? Pain Relieving Factors Exercise and rest   ? Effect of Pain on Daily Activities Limits her ability to comfortably work a full day   ? ?  ?  ? ?  ? ? ? ? ? ? ? ? ? ? ? ? ? ? ? ? ? ? ? ? [facility] Adult PT Treatment/Exercise - 03/26/21 0001   ? ?  ? Exercises  ? Exercises Knee/Hip;Ankle   ?  ? Knee/Hip Exercises: Stretches  ? Other Knee/Hip Stretches Heel cords box 3X 60 seconds (upper & lower)   ? Other Knee/Hip Stretches Upper and lower heel cords stretch 4X each 20 seconds (knees straight & bent)   ?  ? Knee/Hip Exercises: Standing  ? Heel Raises Both;3 sets;10 reps;3 seconds;Limitations   ? Heel Raises Limitations slow eccentrics   ? ?  ?  ? ?  ? ? ? ? ? ? ? ? ? ?  PT Education - 03/26/21 1214   ? ? Education Details Reviewed emphasis on activities covered today.  Mentioned quadriceps strength will need to be progressed for the knee to improve while the current program should help her manage her plantar fasciitis.   ? Person(s) Educated Patient   ? Methods Explanation;Demonstration;Tactile cues;Handout;Verbal cues   ? Comprehension Verbalized understanding;Verbal cues required;Need further instruction;Tactile cues required;Returned demonstration   ? ?  ?  ? ?  ? ? ? PT Short Term Goals - 02/19/21 1125   ? ?  ? PT SHORT TERM GOAL #1  ? Title Assess single leg stance and write long term goal based on results   ? Baseline Achieved 30 s bilat SLS   ? Time 2   ? Period Weeks   ? Status Achieved   ? Target Date 01/29/21   ? ?  ?  ? ?  ? ? ? ? PT Long Term Goals - 03/05/21 1002   ? ?  ? PT LONG TERM GOAL #1  ? Title Report less than 3/10 pain with standing exercises and activities   ? Baseline 5/10 currently   ? Time 4    ? Period Weeks   ? Status On-going   ? Target Date 04/02/21   ?  ? PT LONG TERM GOAL #2  ? Title Increase L hip strength to 4+/5 and L knee strength to 5/5   ? Baseline Except for left hip adduction and abduction   ? Time 6   ? Period Weeks   ? Status Partially Met   ? Target Date 04/02/21   ?  ? PT LONG TERM GOAL #3  ? Title Decrease patella tracking laterally to no more than mild   ? Baseline Patellar dislocation present 03/05/21   ? Time 6   ? Period Weeks   ? Status On-going   ? Target Date 04/02/21   ?  ? PT LONG TERM GOAL #4  ? Title Increase FOTO score to 69%   ? Baseline Currently 49%   ? Time 6   ? Period Weeks   ? Status On-going   ? Target Date 04/02/21   ? ?  ?  ? ?  ? ? ? ? ? ? ? ? Plan - 03/26/21 1042   ? ? Clinical Impression Statement Delanee still has functional limitations due to her L knee and foot.  Her plantar fasciitis appears to be most limiting and we spent quite a bit of time addressing this today.  L quadriceps weakness is also evident as quadriceps lag was noted with seated straight leg raises.  Improving quadriceps and calf strength will be key in addressing knee and foot impairments.  Follow-up next week to assess compliance, progress and to hopefully advance her quadriceps strengthening.   ? Personal Factors and Comorbidities Comorbidity 1   ? Comorbidities anemia, knee OA   ? Examination-Activity Limitations Locomotion Level;Squat;Stairs;Stand   ? Examination-Participation Restrictions Occupation;Community Activity;Shop   ? Stability/Clinical Decision Making Stable/Uncomplicated   ? Rehab Potential Good   ? PT Frequency 2x / week   ? PT Duration 6 weeks   ? PT Treatment/Interventions ADLs/Self Care Home Management;Cryotherapy;Electrical Stimulation;Moist Heat;Gait training;Ultrasound;Functional mobility training;Stair training;Therapeutic activities;Therapeutic exercise;Balance training;Neuromuscular re-education;Patient/family education;Manual techniques;Passive range of motion;Dry  needling;Vasopneumatic Device;Joint Manipulations   ? PT Next Visit Plan Progress quadriceps strength, review HEP   ? PT Home Exercise Plan Access Code: [email or web address]   ? Consulted and Agree with Plan of Care Patient   ? ?  ?  ? ?  ? ? ?  Patient will benefit from skilled therapeutic intervention in order to improve the following deficits and impairments:  Decreased balance, Decreased mobility, Difficulty walking, Decreased range of motion, Pain, Decreased activity tolerance, Decreased strength, Increased edema, Impaired flexibility ? ?Visit Diagnosis: ?Chronic pain of left knee ? ?Muscle weakness (generalized) ? ?Difficulty in walking, not elsewhere classified ? ? ? ? ?Problem List ?Patient Active Problem List  ? Diagnosis Date Noted  ? Pain in left knee 01/05/2021  ? Decreased visual acuity 09/25/2019  ? Lateral epicondylitis   ? Lateral epicondylitis, left elbow 05/09/2019  ? Effusion of left elbow 05/09/2019  ? Seasonal allergies 04/19/2019  ? Closed dislocation of left jaw 04/19/2019  ? Medial epicondylitis of left elbow 12/01/2018  ? Hemoglobin S-C disease ([facility])   ? Anemia   ? Vaginal Pap smear, abnormal   ? Headache   ? 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 Sickle cell-hemoglobin C disease ([facility]) 07/13/2011  ? ? ?Rob W Lovell, PT, MPT ?03/26/2021, 12:19 PM ? ? ?[facility] ?[address] ?[place], [place], [address] ?Phone: [phone]   Fax:  [phone] ? ?Name: Teanna Canton ?MRN: [id number] ?Date of Birth: 01/04/1980 ? ? ? ?

## 2021-03-26 NOTE — Patient Instructions (Signed)
Access Code: [email or web address] ?URL: [email or web address] ?Date: 03/26/2021 ?Prepared by: Vista Mink ? ?Exercises ? ?- Standing Gastroc Stretch at [employer]  - 3 x daily - 7 x weekly - 1 sets - 4-5 reps - 20 seconds hold ?- Heel Raises with Counter Support  - 5-10 x daily - 7 x weekly - 1-2 sets - 10 reps - 3 seconds hold ? ?- Seated Self Great Toe Stretch  - 2 x daily - 6 x weekly - 1 sets - 3 reps - 30 sec hold ?- Supine Quadricep Sets  - 2-3 x daily - 7 x weekly - 2-3 sets - 10 reps - 5 second hold ?- Standing Soleus Stretch  - 3 x daily - 7 x weekly - 1 sets - 4-5 reps - 20 seconds hold ?

## 2021-04-02 ENCOUNTER — Ambulatory Visit (INDEPENDENT_AMBULATORY_CARE_PROVIDER_SITE_OTHER): Payer: Self-pay | Admitting: Physical Therapy

## 2021-04-02 ENCOUNTER — Encounter: Payer: Self-pay | Admitting: Physical Therapy

## 2021-04-02 DIAGNOSIS — R262 Difficulty in walking, not elsewhere classified: Secondary | ICD-10-CM

## 2021-04-02 DIAGNOSIS — G8929 Other chronic pain: Secondary | ICD-10-CM

## 2021-04-02 DIAGNOSIS — M25562 Pain in left knee: Secondary | ICD-10-CM

## 2021-04-02 DIAGNOSIS — M6281 Muscle weakness (generalized): Secondary | ICD-10-CM

## 2021-04-02 NOTE — Therapy (Addendum)
OUTPATIENT PHYSICAL THERAPY TREATMENT NOTE/Discharge PHYSICAL THERAPY DISCHARGE SUMMARY  Visits from Start of Care: 8  Current functional level related to goals / functional outcomes: See below   Remaining deficits: See below   Education / Equipment: HEP  Plan: Patient goals partially met Patient is being discharged due to not returning to PT.   Taylor Roman, PT, DPT 06/08/21 1:50 PM   Patient Name: Taylor Roman MRN: [id number] DOB:1980-04-14, 41 y.o., female Today's Date: 04/02/2021  PCP: Taylor [place], MD REFERRING PROVIDER: Garald Balding, MD   PT End of Session - 04/02/21 1021     Visit Number 8    Number of Visits 12    Date for PT Re-Evaluation 04/02/21    PT Start Time 8527    PT Stop Time 1055    PT Time Calculation (min) 40 min    Activity Tolerance Patient tolerated treatment well    Behavior During Therapy [facility] for tasks assessed/performed             Past Medical History:  Diagnosis Date   Anemia    Has Hgb [place] with sickling   Headache    otc med prn   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  To have repeat pap in JUne 2018 at [facility]   Past Surgical History:  Procedure Laterality Date   CERVICAL CONIZATION W/BX N/A 11/03/2015   Procedure: CONIZATION CERVIX WITH BIOPSY;  Surgeon: Taylor Halim, MD;  Location: [facility];  Service: Gynecology;  Laterality: N/A;   HYSTEROSCOPY WITH D & C N/A 05/10/2017   Procedure: DILATATION AND CURETTAGE /HYSTEROSCOPY;  Surgeon: Taylor Leiter, MD;  Location: [facility];  Service: Gynecology;  Laterality: N/A;   Patient Active Problem List   Diagnosis Date Noted   Pain in left knee 01/05/2021   Decreased visual acuity 09/25/2019   Lateral epicondylitis    Lateral epicondylitis, left elbow 05/09/2019   Effusion of  left elbow 05/09/2019   Seasonal allergies 04/19/2019   Closed dislocation of left jaw 04/19/2019   Medial epicondylitis of left elbow 12/01/2018   Hemoglobin S-C disease ([facility])    Anemia    Vaginal Pap smear, abnormal    Headache    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 15 minutes     How long can you walk comfortably? 15 minutes     Patient Stated Goals Decrease knee pain while walking and standing.     Currently in Pain? Yes     Pain Score 5      Pain Location Foot     Pain Orientation Left     Pain Descriptors / Indicators Aching;Constant;Sharp     Pain Type Chronic pain     Pain Onset More than a month ago  Pain Frequency Constant     Aggravating Factors  WB     Pain Relieving Factors Exercise and rest     Effect of Pain on Daily Activities Limits her ability to comfortably work a full day              Today's treatment 04/02/21 Therapeutic Exercise: Aerobic: Nu step L6 X 8 min UE/LE seat 12 Supine: Prone: Seated:plantar flexion stretch on left 3X30 sec. Seated SLR 2X10, seated ball squeezes 5 sec X15 Standing: lunge stretch for left knee/ankle with foot in chair 10 sec X10 Machines:leg press DL 75# 2X10, then SL 50# 2X10 bilat. Neuromuscular Re-education: Manual Therapy: Therapeutic Activity: Modalities:                 PT Education - 03/26/21 1214       Education Details Reviewed emphasis on activities covered today.  Mentioned quadriceps strength will  need to be progressed for the knee to improve while the current program should help her manage her plantar fasciitis.     Person(s) Educated Patient     Methods Explanation;Demonstration;Tactile cues;Handout;Verbal cues     Comprehension Verbalized understanding;Verbal cues required;Need further instruction;Tactile cues required;Returned demonstration                     PT Short Term Goals - 02/19/21 1125                PT SHORT TERM GOAL #1    Title Assess single leg stance and write long term goal based on results     Baseline Achieved 30 s bilat SLS     Time 2     Period Weeks     Status Achieved     Target Date 01/29/21                       PT Long Term Goals - 03/05/21 1002                PT LONG TERM GOAL #1    Title Report less than 3/10 pain with standing exercises and activities     Baseline 5/10 currently     Time 4     Period Weeks     Status On-going     Target Date 04/02/21          PT LONG TERM GOAL #2    Title Increase L hip strength to 4+/5 and L knee strength to 5/5     Baseline Except for left hip adduction and abduction     Time 6     Period Weeks     Status Partially Met     Target Date 04/02/21          PT LONG TERM GOAL #3    Title Decrease patella tracking laterally to no more than mild     Baseline Patellar dislocation present 03/05/21     Time 6     Period Weeks     Status On-going     Target Date 04/02/21          PT LONG TERM GOAL #4    Title Increase FOTO score to 69%     Baseline Currently 49%     Time 6     Period Weeks     Status On-going     Target Date 04/02/21  Plan - 03/26/21 1042       Clinical Impression Statement We reviewed her HEP again for understanding. She had some knee pain with standing soleus stretch so we modified this to lunge stretch with left foot in chair. She does still lack some quad strength and has some apparent plantar fasciitis this should continue to  improve with more strengthening and stretching exercise. She will continue to work on exercises at home and can follow up with PT if needed.     Personal Factors and Comorbidities Comorbidity 1     Comorbidities anemia, knee OA     Examination-Activity Limitations Locomotion Level;Squat;Stairs;Stand     Examination-Participation Restrictions Occupation;Community Activity;Shop     Stability/Clinical Decision Making Stable/Uncomplicated     Rehab Potential Good     PT Frequency 2x / week     PT Duration 6 weeks     PT Treatment/Interventions ADLs/Self Care Home Management;Cryotherapy;Electrical Stimulation;Moist Heat;Gait training;Ultrasound;Functional mobility training;Stair training;Therapeutic activities;Therapeutic exercise;Balance training;Neuromuscular re-education;Patient/family education;Manual techniques;Passive range of motion;Dry needling;Vasopneumatic Device;Joint Manipulations     PT Next Visit Plan Progress quadriceps strength, review HEP     PT Home Exercise Plan Access Code: [email or web address]     Consulted and Agree with Plan of Care Patient                   Patient will benefit from skilled therapeutic intervention in order to improve the following deficits and impairments:  Decreased balance, Decreased mobility, Difficulty walking, Decreased range of motion, Pain, Decreased activity tolerance, Decreased strength, Increased edema, Impaired flexibility   Debbe Odea, PT,DPT 04/02/2021, 10:23 AM

## 2021-04-16 ENCOUNTER — Other Ambulatory Visit: Payer: Self-pay

## 2021-04-16 ENCOUNTER — Telehealth: Payer: Self-pay

## 2021-04-16 ENCOUNTER — Emergency Department (HOSPITAL_COMMUNITY): Payer: Self-pay

## 2021-04-16 ENCOUNTER — Emergency Department (HOSPITAL_COMMUNITY)
Admission: EM | Admit: 2021-04-16 | Discharge: 2021-04-16 | Disposition: A | Discharge status: Home / Self Care | Payer: Self-pay | Attending: Emergency Medicine | Admitting: Emergency Medicine

## 2021-04-16 ENCOUNTER — Encounter (HOSPITAL_COMMUNITY): Payer: Self-pay

## 2021-04-16 DIAGNOSIS — S0302XA Dislocation of jaw, left side, initial encounter: Secondary | ICD-10-CM | POA: Insufficient documentation

## 2021-04-16 DIAGNOSIS — X58XXXA Exposure to other specified factors, initial encounter: Secondary | ICD-10-CM | POA: Insufficient documentation

## 2021-04-16 MED ORDER — PROPOFOL 10 MG/ML IV BOLUS
INTRAVENOUS | Status: AC | PRN
  Administered 2021-04-16: 30 mg via INTRAVENOUS

## 2021-04-16 MED ORDER — PROPOFOL 10 MG/ML IV BOLUS
48.0000 mg | Freq: Once | INTRAVENOUS | Status: AC
Start: 2021-04-16 — End: 2021-04-16
  Administered 2021-04-16: 50 mg via INTRAVENOUS
  Filled 2021-04-16: qty 20

## 2021-04-16 MED ORDER — MORPHINE SULFATE (PF) 4 MG/ML IV SOLN
4.0000 mg | Freq: Once | INTRAVENOUS | Status: AC
  Administered 2021-04-16: 4 mg via INTRAVENOUS
  Filled 2021-04-16: qty 1

## 2021-04-16 MED ORDER — ONDANSETRON HCL 4 MG/2ML IJ SOLN
4.0000 mg | Freq: Once | INTRAMUSCULAR | Status: AC
  Administered 2021-04-16: 4 mg via INTRAVENOUS
  Filled 2021-04-16: qty 2

## 2021-04-16 NOTE — Telephone Encounter (Signed)
Patient would like an appt to follow up after ED visit for dislocation of left jaw.  ?

## 2021-04-16 NOTE — ED Provider Notes (Signed)
? ?Emergency Department Provider Note ? ? ?I have reviewed the triage vital signs and the nursing notes. ? ? ?HISTORY ? ?Chief Complaint ?Jaw Pain ? ? ?HPI ?Taylor Roman is a 41 y.o. female with past history of recurrent TMJ dislocation presents to the ED with pain in the left jaw and concern for recurrent dislocation. She was eating at 11 PM today with symptoms developed. Unable to fully over her mouth. No numbness. No SOB or difficulty swallowing.  ? ?Past Medical History:  ?Diagnosis Date  ? Anemia   ? 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 Headache   ? otc med prn  ? 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 Trait for both  ? Lateral epicondylitis   ? TMJ (dislocation of temporomandibular joint)   ? Recurrent  ? Vaginal Pap smear, abnormal   ? 10/2015 cervical conization with biopsy.  To have repeat pap in JUne 2018 at [facility]  ? ? ?Review of Systems ? ?Constitutional: No fever/chills ?Eyes: No visual changes. ?ENT: No sore throat. Positive left jaw pain.  ?Cardiovascular: Denies chest pain. ?Respiratory: Denies shortness of breath. ?Gastrointestinal: No abdominal pain.   ?Genitourinary: Negative for dysuria. ?Musculoskeletal: Negative for back pain. ?Skin: Negative for rash. ?Neurological: Negative for headaches, focal weakness or numbness. ? ? ?____________________________________________ ? ? ?PHYSICAL EXAM: ? ?VITAL SIGNS: ?ED Triage Vitals  ?Enc Vitals Group  ?   BP 04/16/21 0029 138/79  ?   Pulse Rate 04/16/21 0029 75  ?   Resp 04/16/21 0029 19  ?   Temp 04/16/21 0029 98.4 ?F (36.9 ?C)  ?   Temp Source 04/16/21 0029 Oral  ?   SpO2 04/16/21 0029 100 %  ? ?Constitutional: Alert and oriented. Well appearing and in no acute distress. ?Eyes: Conjunctivae are normal. PERRL. EOMI. ?Head: Atraumatic. ?Nose: No congestion/rhinnorhea. ?Mouth/Throat: Mucous membranes are moist.  Oropharynx non-erythematous. Tenderness to the left jaw. Only able to open jaw 1 finger breadth.  ?Neck: No stridor.   ?Cardiovascular: Normal rate,  regular rhythm. Good peripheral circulation. Grossly normal heart sounds.   ?Respiratory: Normal respiratory effort.  No retractions. Lungs CTAB. ?Gastrointestinal: No distention.  ?Musculoskeletal: No gross deformities of extremities. ?Neurologic:  Normal speech and language. ?Skin:  Skin is warm, dry and intact. No rash noted. ? ?____________________________________________ ? ?RADIOLOGY ? ?DG TMJ Open & Close Unilateral Left ? ?Result Date: 04/16/2021 ?CLINICAL DATA:  Postreduction. EXAM: LEFT UNILATERAL TEMPOROMANDIBULAR JOINTS COMPARISON:  04/16/2021. FINDINGS: The left mandibular condyle appear seated in the temporomandibular joint. Mild anterior subluxation is present on open-mouth view. No acute fracture is identified. IMPRESSION: Left mandibular condyle is seated in the temporomandibular joint with mild anterior subluxation open-mouth view. Electronically Signed   By: Brett Fairy M.D.   On: 04/16/2021 04:56  ? ?CT Maxillofacial Wo Contrast ? ?Result Date: 04/16/2021 ?CLINICAL DATA:  Blunt facial trauma, possible TMJ dislocation on the left. EXAM: CT MAXILLOFACIAL WITHOUT CONTRAST TECHNIQUE: Multidetector CT imaging of the maxillofacial structures was performed. Multiplanar CT image reconstructions were also generated. RADIATION DOSE REDUCTION: This exam was performed according to the departmental dose-optimization program which includes automated exposure control, adjustment of the mA and/or kV according to patient size and/or use of iterative reconstruction technique. COMPARISON:  07/23/2020. FINDINGS: Osseous: There is mild anterior inferior dislocation of the mandibular condyle at the temporomandibular joint on the left. The TMJ on the right is within normal limits. No acute fracture is identified. Orbits: Negative. No traumatic or inflammatory finding. Sinuses: Mild mucosal  thickening is present in the maxillary sinuses bilaterally. There is a hyperdense structure in the frontal sinus on the right which  is unchanged from multiple prior exams. Soft tissues: Prominent lymph nodes are noted along the jugular chains bilaterally measuring up to 1.7 cm. Limited intracranial: No significant or unexpected finding. IMPRESSION: 1. Anterior and inferior dislocation of the mandibular condyle on the left at the TMJ. No acute fracture. 2. Nonspecific enlarged lymph nodes along the jugular chains bilaterally, may be reactive. Clinical correlation is recommended. Electronically Signed   By: Brett Fairy M.D.   On: 04/16/2021 03:09   ? ?____________________________________________ ? ? ?PROCEDURES ? ?Procedure(s) performed:  ? ?Reduction of dislocation ? ?Date/Time: 04/16/2021 4:15 AM ?Performed by: Margette Fast, MD ?Authorized by: Margette Fast, MD  ?Consent: Written consent obtained. ?Risks and benefits: risks, benefits and alternatives were discussed ?Consent given by: patient ?Required items: required blood products, implants, devices, and special equipment available ?Patient identity confirmed: verbally with patient and arm band ?Time out: Immediately prior to procedure a "time out" was called to verify the correct patient, procedure, equipment, support staff and site/side marked as required. ?Local anesthesia used: no ? ?Anesthesia: ?Local anesthesia used: no ? ?Sedation: ?Patient sedated: yes ?Sedation type: (deep) ?Sedatives: propofol ?Analgesia: morphine ?Vitals: Vital signs were monitored during sedation. ? ?Patient tolerance: patient tolerated the procedure well with no immediate complications ?Comments: Once adequate sedation was achieved, steady downward and posterior traction applied with palpable reduction of left TMJ appreciated. ROM improved.  ? ? ?Marland KitchenSedation ? ?Date/Time: 04/16/2021 4:16 AM ?Performed by: Margette Fast, MD ?Authorized by: Margette Fast, MD  ? ?Consent:  ?  Consent obtained:  Written ?  Consent given by:  Patient ?  Risks discussed:  Allergic reaction, dysrhythmia, nausea, inadequate sedation,  vomiting, respiratory compromise necessitating ventilatory assistance and intubation, prolonged sedation necessitating reversal and prolonged hypoxia resulting in organ damage ?Universal protocol:  ?  Immediately prior to procedure, a time out was called: yes   ?  Patient identity confirmed:  Verbally with patient and arm band ?Indications:  ?  Procedure performed:  Dislocation reduction ?  Procedure necessitating sedation performed by:  Physician performing sedation ?Pre-sedation assessment:  ?  Time since last food or drink:  5 hours ?  ASA classification: class 2 - patient with mild systemic disease   ?  Mallampati score:  II - soft palate, uvula, fauces visible ?  Pre-sedation assessments completed and reviewed: airway patency, cardiovascular function, hydration status, mental status, nausea/vomiting, pain level, respiratory function and temperature   ?Immediate pre-procedure details:  ?  Reassessment: Patient reassessed immediately prior to procedure   ?  Reviewed: vital signs, relevant labs/tests and NPO status   ?  Verified: bag valve mask available, emergency equipment available, intubation equipment available, IV patency confirmed, oxygen available and suction available   ?Procedure details (see MAR for exact dosages):  ?  Preoxygenation:  Nasal cannula ?  Sedation:  Propofol ?  Intended level of sedation: deep ?  Analgesia:  Morphine ?  Intra-procedure monitoring:  Blood pressure monitoring, cardiac monitor, continuous pulse oximetry, continuous capnometry, frequent LOC assessments and frequent vital sign checks ?  Intra-procedure events: none   ?  Total Provider sedation time (minutes):  20 ?Post-procedure details:  ?  Attendance: Constant attendance by certified staff until patient recovered   ?  Recovery: Patient returned to pre-procedure baseline   ?  Post-sedation assessments completed and reviewed: airway patency, cardiovascular function, hydration status,  mental status, nausea/vomiting, pain level,  respiratory function and temperature   ?  Patient is stable for discharge or admission: yes   ?  Procedure completion:  Tolerated well, no immediate complications ? ? ?_______________________________________

## 2021-04-16 NOTE — ED Notes (Signed)
Discharge instructions reviewed, questions answered. Pt advised to follow up with ENT provided on discharge paperwork. Pt states understanding and no further questions. Pt ambulatory wheeled to ride upon discharge. No s/s of distress noted. ? ?

## 2021-04-16 NOTE — Discharge Instructions (Signed)
We were able to reduce your jaw dislocation.  I listed the name for an ear nose and throat doctor but you may also follow-up with the person whom you were referred to previously.  ? ?Return with any new or worsening symptoms.  ?

## 2021-04-16 NOTE — ED Triage Notes (Signed)
Patient arrived stating her jaw dislocated after eating tonight at 11pm, unable to open and close her mouth to full ROM. States she has had to have it reset in the past.  ?

## 2021-04-16 NOTE — Sedation Documentation (Signed)
Pt alert to verbal simuli and able to open and close mouth slightly. Pt nodded "yes" when asked if it feels like her jaw is back into place. ?

## 2021-04-16 NOTE — Sedation Documentation (Signed)
Dr Laverta Baltimore heard small click/pop while attempting to reduce jaw dislocation at this time ?

## 2021-04-16 NOTE — Progress Notes (Signed)
Respiratory therapy present for the duration of sedation procedure.  Pt remained stable and comfortable on EtCO2 monitoring throughout the procedure. ?

## 2021-04-29 ENCOUNTER — Encounter: Payer: Self-pay | Admitting: Internal Medicine

## 2021-04-29 ENCOUNTER — Ambulatory Visit: Payer: Self-pay | Admitting: Internal Medicine

## 2021-04-29 VITALS — BP 110/66 | HR 60 | Resp 16 | Ht 67.0 in | Wt 214.0 lb

## 2021-04-29 DIAGNOSIS — D5720 Sickle-cell/Hb-C disease without crisis: Secondary | ICD-10-CM

## 2021-04-29 DIAGNOSIS — S0300XS Dislocation of jaw, unspecified side, sequela: Secondary | ICD-10-CM

## 2021-04-29 NOTE — Progress Notes (Signed)
? ? ?  Subjective:  ?  ?Patient ID: Taylor Roman, female   DOB: 06-15-1980, 41 y.o.   MRN: [id number] ? ? ?HPI ? ? Here for follow up of recurrently dislocating left TMJ.  She was recently seen again in ED, this time [facility], and referred to ENT.   ?She had been referred back in 07/2019 for same, but apparently they were not able to get in contact with her and referral was closed. ?She was then referred to dental clinic and referred to specialist through dental clinic, but never heard back from that office as well.   ?Jaw currently without pain as in place.   ? ? ?2.  Hgb [place] disease:  to be started on medication she states soon.  Has an appt in May to discuss new medication.   ? ?Current Meds  ?Medication Sig  ? acetaminophen (TYLENOL) 500 MG tablet Take 500 mg by mouth every 6 (six) hours as needed for mild pain.  ? Calcium Citrate-Vitamin D 250-200 MG-UNIT TABS 2 tabs by mouth twice daily.  ? cyclobenzaprine (FLEXERIL) 5 MG tablet 1 tab by mouth at bedtime as needed for back  ? folic acid (FOLVITE) [id number] MCG tablet Take 400 mcg by mouth daily.  ? ibuprofen (ADVIL) 800 MG tablet Take 1 tablet (800 mg total) by mouth every 6 (six) hours as needed for moderate pain.  ? vitamin B-12 (CYANOCOBALAMIN) 100 MCG tablet Take 500 mcg by mouth daily. Unsure of dosage  ? ?No Known Allergies ? ? ?Review of Systems ? ? ? ?Objective:  ? ?BP 110/66 (BP Location: Left Arm, Patient Position: Sitting, Cuff Size: Normal)   Pulse 60   Resp 16   Ht 5\' 7"  (1.702 m)   Wt 214 lb (97.1 kg)   LMP 04/19/2021 (Exact Date)   BMI 33.52 kg/m?  ? ?Physical Exam ?HEENT:  Jaw opens and closes without difficulty.  Miminal tenderness over left TMJ currently. ?Neck:  Supple, No adenopathy ?Chest:  CTA ?CV:  RRR without murmur or rub.  Radial pulses normal and equal ? ? ? ?Assessment & Plan  ? ? Recurrent Left TMJ dislocation:  referral with questions back to [facility] dental clinic for this.  Would like to get her sent directly to whatever oral surgeon  previously recommended.  [facility] states this is not their area of treatment--needs to go to a dental/oral surgeon.  Gave number for Edrick Oh, DDS, but no answer at his practice and unable to leave voicemail. ? ?2.  Hgb [place] disease:  [facility]. ? ?

## 2021-04-30 NOTE — Telephone Encounter (Signed)
Seen by Dr Mulberry 

## 2021-05-08 ENCOUNTER — Encounter (HOSPITAL_COMMUNITY): Payer: Self-pay | Admitting: Emergency Medicine

## 2021-05-08 ENCOUNTER — Emergency Department (HOSPITAL_COMMUNITY): Payer: Self-pay

## 2021-05-08 ENCOUNTER — Other Ambulatory Visit: Payer: Self-pay

## 2021-05-08 ENCOUNTER — Emergency Department (HOSPITAL_COMMUNITY)
Admission: EM | Admit: 2021-05-08 | Discharge: 2021-05-08 | Disposition: A | Discharge status: Home / Self Care | Payer: Self-pay | Attending: Emergency Medicine | Admitting: Emergency Medicine

## 2021-05-08 DIAGNOSIS — R0789 Other chest pain: Secondary | ICD-10-CM | POA: Insufficient documentation

## 2021-05-08 DIAGNOSIS — R519 Headache, unspecified: Secondary | ICD-10-CM | POA: Insufficient documentation

## 2021-05-08 DIAGNOSIS — D571 Sickle-cell disease without crisis: Secondary | ICD-10-CM | POA: Insufficient documentation

## 2021-05-08 DIAGNOSIS — R002 Palpitations: Secondary | ICD-10-CM | POA: Insufficient documentation

## 2021-05-08 LAB — CBC
HCT: 29.2 % — ABNORMAL LOW (ref 36.0–46.0)
Hemoglobin: 10.5 g/dL — ABNORMAL LOW (ref 12.0–15.0)
MCH: 27.3 pg (ref 26.0–34.0)
MCHC: 36 g/dL (ref 30.0–36.0)
MCV: 75.8 fL — ABNORMAL LOW (ref 80.0–100.0)
Platelets: 220 10*3/uL (ref 150–400)
RBC: 3.85 MIL/uL — ABNORMAL LOW (ref 3.87–5.11)
RDW: 17.6 % — ABNORMAL HIGH (ref 11.5–15.5)
WBC: 5.9 10*3/uL (ref 4.0–10.5)
nRBC: 0 % (ref 0.0–0.2)

## 2021-05-08 LAB — I-STAT BETA HCG BLOOD, ED (MC, WL, AP ONLY): I-stat hCG, quantitative: <5 m[IU]/mL (ref <5)

## 2021-05-08 LAB — TROPONIN I (HIGH SENSITIVITY): Troponin I (High Sensitivity): <2 ng/L (ref <18)

## 2021-05-08 LAB — BASIC METABOLIC PANEL
Anion gap: 3 — ABNORMAL LOW (ref 5–15)
BUN: 5 mg/dL — ABNORMAL LOW (ref 6–20)
CO2: 24 mmol/L (ref 22–32)
Calcium: 8.8 mg/dL — ABNORMAL LOW (ref 8.9–10.3)
Chloride: 110 mmol/L (ref 98–111)
Creatinine, Ser: 0.57 mg/dL (ref 0.44–1.00)
GFR, Estimated: >60 mL/min (ref >60)
Glucose, Bld: 96 mg/dL (ref 70–99)
Potassium: 3.9 mmol/L (ref 3.5–5.1)
Sodium: 137 mmol/L (ref 135–145)

## 2021-05-08 LAB — D-DIMER, QUANTITATIVE: D-Dimer, Quant: 0.64 ug/mL-FEU — ABNORMAL HIGH (ref 0.00–0.50)

## 2021-05-08 MED ORDER — SODIUM CHLORIDE 0.9% FLUSH: 3.0000 mL | Freq: Once | INTRAVENOUS | Status: DC

## 2021-05-08 MED ORDER — IOHEXOL 350 MG/ML SOLN
100.0000 mL | Freq: Once | INTRAVENOUS | Status: AC | PRN
  Administered 2021-05-08: 75 mL via INTRAVENOUS

## 2021-05-08 NOTE — Discharge Instructions (Signed)
Your work-up today was very reassuring.  Your labs did not show any major findings, particularly there is no damage to your heart.  Your EKG was normal.  We got a CTA of your chest which no evidence of a blood clot which can also cause some the symptoms you are experiencing.  While unfortunately I do not have an answer for what caused your symptoms, I hope you are reassured by the fact that I have ruled out several scary things such as heart attack and blood clot.  Oftentimes these symptoms can be caused by inadequate water intake or not eating enough food.  If this continues to happen, please follow-up with your primary care doctor. ?

## 2021-05-08 NOTE — ED Notes (Signed)
Discharge instructions reviewed with patient. Patient verbalized understanding of instructions. Follow-up care and medications were reviewed. Patient ambulatory with steady gait. VSS upon discharge.  ?

## 2021-05-08 NOTE — ED Triage Notes (Signed)
C/o dizziness, headache, palpitations, and diaphoresis since 1pm.  Reports history of headaches.  No arm drift. ?

## 2021-05-08 NOTE — ED Provider Notes (Signed)
?[facility] ?Provider Note ? ? ?CSN: [id number] ?Arrival date & time: 05/08/21  1607 ? ?  ? ?History ? ?Chief Complaint  ?Patient presents with  ? Dizziness  ? Palpitations  ? Headache  ? ? ?Taylor Roman is a 41 y.o. female with history of sickle cell who presents to the ED for evaluation of dizziness described as lightheadedness that occurred while she was standing in the kitchen earlier today.  At that time, patient also experienced chest pain described as pressure, shortness of breath, palpitations and mild headache.  Chest pain is nonradiating.  She sat down and rested and symptoms improved.  She denies abdominal pain, nausea, vomiting, diarrhea.  No recent illness, specifically no fevers, cough, congestion.  She notes that this happened once before several months ago again with symptoms resolving on its own.  She cannot see, to call care at that time.  No previous history of cardiac disease or blood clot. ? ?Dizziness ?Associated symptoms: headaches and palpitations   ?Palpitations ?Associated symptoms: dizziness   ?Headache ?Associated symptoms: dizziness   ? ?  ? ?Home Medications ?Prior to Admission medications   ?Medication Sig Start Date End Date Taking? Authorizing Provider  ?acetaminophen (TYLENOL) 500 MG tablet Take 500 mg by mouth every 6 (six) hours as needed for mild pain.    [provider]  ?Calcium Citrate-Vitamin D 250-200 MG-UNIT TABS 2 tabs by mouth twice daily. 09/25/20   Julieanne MansonMulberry, Elizabeth, MD  ?cyclobenzaprine (FLEXERIL) 5 MG tablet 1 tab by mouth at bedtime as needed for back 02/04/21   Julieanne MansonMulberry, Elizabeth, MD  ?folic acid (FOLVITE) 800 MCG tablet Take 400 mcg by mouth daily.    [provider]  ?ibuprofen (ADVIL) 800 MG tablet Take 1 tablet (800 mg total) by mouth every 6 (six) hours as needed for moderate pain. 01/05/20   Gilda CreasePollina, Christopher J, MD  ?L-glutamine (ENDARI) 5 g PACK Powder Packet Take by mouth. 02/26/21   [provider]  ?vitamin B-12 (CYANOCOBALAMIN) 100 MCG tablet Take 500 mcg by mouth daily. Unsure of dosage    [provider]  ?loratadine (CLARITIN) 10 MG tablet Take 1 tablet (10 mg total) by mouth daily. ?Patient not taking: Reported on 06/06/2019 04/19/19 06/06/19  Julieanne MansonMulberry, Elizabeth, MD  ?   ? ?Allergies    ?Patient has no known allergies.   ? ?Review of Systems   ?Review of Systems  ?Cardiovascular:  Positive for palpitations.  ?Neurological:  Positive for dizziness and headaches.  ? ?Physical Exam ?Updated Vital Signs ?BP 117/72 (BP Location: Left Arm)   Pulse (!) 53   Temp 99 ?F (37.2 ?C) (Oral)   Resp 18   LMP 04/19/2021 (Exact Date)   SpO2 100%  ?Physical Exam ?Vitals and nursing note reviewed.  ?Constitutional:   ?   General: She is not in acute distress. ?   Appearance: She is not ill-appearing.  ?HENT:  ?   Head: Atraumatic.  ?Eyes:  ?   Conjunctiva/sclera: Conjunctivae normal.  ?Cardiovascular:  ?   Rate and Rhythm: Normal rate and regular rhythm.  ?   Pulses: Normal pulses.     ?     Radial pulses are 2+ on the right side and 2+ on the left side.  ?     Dorsalis pedis pulses are 2+ on the right side and 2+ on the left side.  ?   Heart sounds: No murmur heard. ?   Comments: Negative JVD, cardiac arrhythmia,, cardiac  arrhythmia, arrhythmia, vasovagal, orthostatic, dehydration, infection, ischemia cardiac arrhythmia, cardiac ischemia, orthostatic, vasovagal, cardiac arrhythmia leukocytosis or anemia ?BMP without acute electrolyte abnormalities ?D-dimer elevated ?BMP without acute abnormality ?Pulmonary:  ?   Effort: Pulmonary effort is normal. No respiratory distress.  ?   Breath sounds: Normal breath sounds.  ?Abdominal:  ?   General: Abdomen is flat. There is no distension.  ?   Palpations: Abdomen is soft.  ?   Tenderness: There is no abdominal tenderness.  ?Musculoskeletal:     ?   General: Normal range of motion.  ?   Cervical back: Normal range of motion.  ?   Right lower leg: No edema.   ?   Left lower leg: No edema.  ?Skin: ?   General: Skin is warm and dry.  ?   Capillary Refill: Capillary refill takes less than 2 seconds.  ?Neurological:  ?   General: No focal deficit present.  ?   Mental Status: She is alert.  ?Psychiatric:     ?   Mood and Affect: Mood normal.  ? ? ?ED Results / Procedures / Treatments   ?Labs ?(all labs ordered are listed, but only abnormal results are displayed) ?Labs Reviewed  ?BASIC METABOLIC PANEL - Abnormal; Notable for the following components:  ?    Result Value  ? BUN 5 (*)   ? Calcium 8.8 (*)   ? Anion gap 3 (*)   ? All other components within normal limits  ?CBC - Abnormal; Notable for the following components:  ? RBC 3.85 (*)   ? Hemoglobin 10.5 (*)   ? HCT 29.2 (*)   ? MCV 75.8 (*)   ? RDW 17.6 (*)   ? All other components within normal limits  ?D-DIMER, QUANTITATIVE - Abnormal; Notable for the following components:  ? D-Dimer, Quant 0.64 (*)   ? All other components within normal limits  ?URINALYSIS, ROUTINE W REFLEX MICROSCOPIC  ?I-STAT BETA HCG BLOOD, ED ([facility], [facility], [facility] ONLY)  ?TROPONIN I (HIGH SENSITIVITY)  ? ? ?EKG ?EKG Interpretation ? ?Date/Time:  Saturday May 08 2021 16:14:16 EDT ?Ventricular Rate:  76 ?PR Interval:  154 ?QRS Duration: 64 ?QT Interval:  378 ?QTC Calculation: 425 ?R Axis:   37 ?Text Interpretation: Normal sinus rhythm Nonspecific ST and T wave abnormality Abnormal ECG When compared with ECG of 16-Oct-2017 23:23, PREVIOUS ECG IS PRESENT flattened T waves are now present Confirmed by Eber Hong ([id number]) on 05/08/2021 5:54:08 PM ? ?Radiology ?DG Chest 2 View ? ?Result Date: 05/08/2021 ?CLINICAL DATA:  Dizziness, headache and palpitations. EXAM: CHEST - 2 VIEW COMPARISON:  Chest radiograph 03/20/2018 FINDINGS: The cardiomediastinal contours are within normal limits. The lungs are clear. No pneumothorax or pleural effusion. No acute finding in the visualized skeleton. IMPRESSION: No active cardiopulmonary disease. Electronically Signed   By: Emmaline Kluver M.D.   On: 05/08/2021 18:38  ? ?CT Angio Chest PE W and/or Wo Contrast ? ?Result Date: 05/08/2021 ?CLINICAL DATA:  Pulmonary embolism (PE) suspected, positive D-dimer Dizziness, palpitations, elevated D-dimer. EXAM: CT ANGIOGRAPHY CHEST WITH CONTRAST TECHNIQUE: Multidetector CT imaging of the chest was performed using the standard protocol during bolus administration of intravenous contrast. Multiplanar CT image reconstructions and MIPs were obtained to evaluate the vascular anatomy. RADIATION DOSE REDUCTION: This exam was performed according to the departmental dose-optimization program which includes automated exposure control, adjustment of the mA and/or kV according to patient size and/or use of iterative reconstruction technique. CONTRAST:  3mL OMNIPAQUE IOHEXOL 350  MG/ML SOLN COMPARISON:  Chest radiograph earlier today.  Chest CTA 10/17/2017 FINDINGS: Cardiovascular: There are no filling defects within the pulmonary arteries to suggest pulmonary embolus. The thoracic aorta is normal in caliber. No acute aortic findings. Conventional branching pattern from the aortic arch. The heart is normal in size. No pericardial effusion. Mediastinum/Nodes: No mediastinal or hilar adenopathy. Scattered bilateral axillary nodes sub demonstrated normal nodal morphology and are not enlarged. No esophageal wall thickening. No visualized thyroid nodule. Lungs/Pleura: Acute or focal airspace disease. Trachea and central bronchi are patent. Pleural effusion Upper Abdomen: No acute or unexpected findings. Musculoskeletal: There is avascular necrosis of the right humeral head without subchondral collapse. No focal bone lesion. Review of the MIP images confirms the above findings. IMPRESSION: 1. No pulmonary embolus or acute intrathoracic abnormality. 2. 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 Electronically Signed   By: Narda Rutherford M.D.   On: 05/08/2021 20:21    ? ?Procedures ?Procedures  ? ? ?Medications Ordered in ED ?Medications  ?sodium chloride flush (NS) 0.9 % injection 3 mL (has no administration in time range)  ?iohexol (OMNIPAQUE) 350 MG/ML injection 100 mL (75 mLs

## 2021-06-11 NOTE — Congregational Nurse Program (Signed)
Hv made to client, she reports husband is not here now, he is in [place], needs are clothing for the older boy.  Seems reluctant to share needs.  Still wants to find cleaning work.  Children will be out of school on Friday.  Youngest son graduated from kindergarten today.  Mom had on colorful dress.  Pleasant.  House clean and neat.  Gave food from [employer]. Referred her to [facility] for food bank.   Will get more for her this week.  Will send info about the immigration clinic at [facility]  Juliann Pulse, RN  Congregational Nurse  [phone].

## 2021-06-24 NOTE — Congregational Nurse Program (Signed)
HV made.  Delivered some food from food bank at [facility].  Husband not in country.  She needs kids clothing at least for older son.  I will research immigration issues for her.   Juliann Pulse, RN  Congregational Nurse  [phone]

## 2021-08-06 NOTE — Congregational Nurse Program (Signed)
Home visit with client.  Has been trying to get children tested for their new school.  Older son has been in camp and this was his last day.  Reports husband back in the home.  He was not present in the visit.  She has been going to [facility] for followup on her Sickle Cell Disease.  They are awaiting approval for meds for her, and so she is on oxycodone for pain.  Says this makes her slowed in her thinking.  She reports some difficulty in remembering things.  I have asked her to send me sizes for her boys so I can ask donor for clothing.  She was given some food from [employer] and was appreciative.  Have previously sent food bank information.  Reminded her of several today.  Juliann Pulse, RN, Congregational Nurse  [phone]

## 2021-08-30 NOTE — Congregational Nurse Program (Signed)
TC and HV to deliver 3 school backpacks for her boys.  Juliann Pulse, RN  Congregational Nurse, [phone].

## 2021-09-10 ENCOUNTER — Ambulatory Visit: Payer: Self-pay | Admitting: Internal Medicine

## 2021-09-10 ENCOUNTER — Encounter: Payer: Self-pay | Admitting: Internal Medicine

## 2021-09-10 VITALS — BP 120/80 | HR 68 | Resp 16 | Ht 68.0 in | Wt 223.0 lb

## 2021-09-10 DIAGNOSIS — D509 Iron deficiency anemia, unspecified: Secondary | ICD-10-CM

## 2021-09-10 DIAGNOSIS — G8929 Other chronic pain: Secondary | ICD-10-CM

## 2021-09-10 DIAGNOSIS — E559 Vitamin D deficiency, unspecified: Secondary | ICD-10-CM

## 2021-09-10 DIAGNOSIS — S0300XS Dislocation of jaw, unspecified side, sequela: Secondary | ICD-10-CM

## 2021-09-10 DIAGNOSIS — M25562 Pain in left knee: Secondary | ICD-10-CM

## 2021-09-10 DIAGNOSIS — Z604 Social exclusion and rejection: Secondary | ICD-10-CM

## 2021-09-10 DIAGNOSIS — D5720 Sickle-cell/Hb-C disease without crisis: Secondary | ICD-10-CM

## 2021-09-10 DIAGNOSIS — Z0000 Encounter for general adult medical examination without abnormal findings: Secondary | ICD-10-CM

## 2021-09-10 NOTE — Progress Notes (Signed)
Subjective:    Patient ID: Taylor Roman, female   DOB: 1980/12/03, 41 y.o.   MRN: [id number]   HPI  CPE without pap  1.  Pap:  Last 11/2020 and normal.  With [facility] Exam.  She is not clear if plans to go back for [facility] exam with [facility] this November.  Does not want a pelvic exam today.    2.  Mammogram:  Last performed 11/2020 and normal.    3.  Osteoprevention:  Does not like milk, not much intake.  She is taking calcium and vitamin D.  Not sure how much.  Walks about 2 miles 5 days weekly.    4.  Guaiac Cards/FIT:  Last checked 11/2020 and negative for blood.    5.  Colonoscopy:  Never.  No family history of colon cancer  6.  Immunizations:  Has not had influenza.  New COVID vaccine not out yet. Immunization History  Administered Date(s) Administered   HIB (PRP-T) 02/26/2021   Influenza Inj Mdck Quad Pf 11/21/2018   Influenza Split 10/06/2011   Influenza,inj,Quad PF,6+ Mos 01/15/2015, 01/29/2021   Influenza-Unspecified 01/15/2015, 11/21/2018   Meningococcal Mcv4o 06/16/2021   Moderna Covid-19 Vaccine Bivalent Booster 66yrs & up 10/09/2020   Moderna Sars-Covid-2 Vaccination 09/25/2019, 10/28/2019, 04/27/2020   PNEUMOCOCCAL CONJUGATE-20 02/26/2021   Tdap 10/20/2011, 03/23/2015     7.  Glucose/Cholesterol:  Blood sugars okay in past.  Cholesterol excellent last fall.   Lipid Panel     Component Value Date/Time   CHOL 141 10/09/2020 0911   TRIG 77 10/09/2020 0911   HDL 51 10/09/2020 0911   LDLCALC 75 10/09/2020 0911   LABVLDL 15 10/09/2020 0911     Current Meds  Medication Sig   acetaminophen (TYLENOL) 500 MG tablet Take 500 mg by mouth every 6 (six) hours as needed for mild pain.   Calcium Citrate-Vitamin D 250-200 MG-UNIT TABS 2 tabs by mouth twice daily.   cyclobenzaprine (FLEXERIL) 5 MG tablet 1 tab by mouth at bedtime as needed for back   folic acid (FOLVITE) 800 MCG tablet Take 400 mcg by mouth daily.   hydroxyurea (HYDREA) 500 MG capsule Take 500  mg by mouth daily. 2 caps my mouth daily   ibuprofen (ADVIL) 800 MG tablet Take 1 tablet (800 mg total) by mouth every 6 (six) hours as needed for moderate pain.   oxyCODONE (OXY IR/ROXICODONE) 5 MG immediate release tablet Take 5 mg by mouth as needed.   vitamin B-12 (CYANOCOBALAMIN) 100 MCG tablet Take 500 mcg by mouth daily. 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  To have repeat pap in JUne 2018 at [facility]   Past Surgical History:  Procedure Laterality Date   CERVICAL CONIZATION W/BX N/A 11/03/2015   Procedure: CONIZATION CERVIX WITH BIOPSY;  Surgeon: Rosendale Bing, MD;  Location: [facility];  Service: Gynecology;  Laterality: N/A;   HYSTEROSCOPY WITH D & C N/A 05/10/2017   Procedure: DILATATION AND CURETTAGE /HYSTEROSCOPY;  Surgeon: Conan Bowens, MD;  Location: [facility];  Service: Gynecology;  Laterality: N/A;  Family History  Problem Relation Age of Onset   Cancer Mother        Hepatic carcinoma?  No history of viral hepatitis she is aware of   Other Sister        Hemoglobin [place] Disease   Other Brother        Hemoglobin [place] Disease   Learning disabilities Maternal Grandmother    Diabetes Maternal Grandmother    Anesthesia problems Neg Hx    Breast cancer Neg Hx    Social History   Socioeconomic History   Marital status: Media planner    Spouse name: [facility]   Number of children: 3   Years of education: 12   Highest education level: Not on file  Occupational History   Occupation: Hair care  Tobacco Use   Smoking status: Never    Passive exposure: Never   Smokeless tobacco: Never  Vaping Use   Vaping Use: Never used  Substance and  Sexual Activity   Alcohol use: No   Drug use: No   Sexual activity: Yes    Birth control/protection: None  Other Topics Concern   Not on file  Social History Narrative   Originally from [place]   Speaks [place], [place] and [place]   Came to [employer]. In 2009   Lives with her 3 young sons.   Father of children,Maliki, her boyfriend now lives with the family as well.   Social Determinants of Health   Financial Resource Strain: Low Risk  (09/10/2021)   Overall Financial Resource Strain (CARDIA)    Difficulty of Paying Living Expenses: Not hard at all  Food Insecurity: No Food Insecurity (09/10/2021)   Hunger Vital Sign    Worried About Running Out of Food in the Last Year: Never true    Ran Out of Food in the Last Year: Never true  Transportation Needs: No Transportation Needs (09/10/2021)   PRAPARE - Administrator, Civil Service (Medical): No    Lack of Transportation (Non-Medical): No  Physical Activity: Insufficiently Active (12/20/2016)   Exercise Vital Sign    Days of Exercise per Week: 1 day    Minutes of Exercise per Session: 20 min  Stress: No Stress Concern Present (12/20/2016)   [employer] of Occupational Health - Occupational Stress Questionnaire    Feeling of Stress : Not at all  Social Connections: Moderately Isolated (12/20/2016)   Social Connection and Isolation Panel [NHANES]    Frequency of Communication with Friends and Family: More than three times a week    Frequency of Social Gatherings with Friends and Family: More than three times a week    Attends Religious Services: Never    Database administrator or Organizations: No    Attends Banker Meetings: Never    Marital Status: Never married  Intimate Partner Violence: Not At Risk (09/10/2021)   Humiliation, Afraid, Rape, and Kick questionnaire    Fear of Current or Ex-Partner: No    Emotionally Abused: No    Physically Abused: No    Sexually Abused: No      Review of Systems  HENT:          Has not heard back from dental clinic regarding referral to likely oral surgeon.  Was sent again in April and still has not heard back.    Cardiovascular:  Positive for chest pain (continues to have high left chest pain that radiates to back on and off.  Radiates to left shoulder.  Also with right shoulder pain to neck--similar feeling pain in right shoulder to neck as left chest pain.  Describes a pressure pain + tenderness).  Gastrointestinal:  Negative for abdominal pain (None since starting hydroxyurea.) and blood in stool (No melena.).  Musculoskeletal:  Positive for arthralgias (Left knee with cartilage tear.  Now seeing ortho at [facility].  Plan to continue injections as needed until reaches and age when can consider knee replacement.  Injection did help.  Also being followed for a heel spur, left foot.).      Objective:   BP 120/80 (BP Location: Left Arm, Patient Position: Sitting, Cuff Size: Normal)   Pulse 68   Resp 16   Ht 5\' 8"  (1.727 m)   Wt 223 lb (101.2 kg)   BMI 33.91 kg/m   Physical Exam Constitutional:      Appearance: She is obese.  HENT:     Head: Normocephalic and atraumatic.     Right Ear: Tympanic membrane, ear canal and external ear normal.     Left Ear: Tympanic membrane, ear canal and external ear normal.     Nose: Nose normal.     Mouth/Throat:     Mouth: Mucous membranes are moist.     Pharynx: Oropharynx is clear.  Eyes:     Extraocular Movements: Extraocular movements intact.     Conjunctiva/sclera: Conjunctivae normal.     Pupils: Pupils are equal, round, and reactive to light.     Comments: Discs sharp  Neck:     Thyroid: No thyroid mass or thyromegaly.  Cardiovascular:     Rate and Rhythm: Normal rate and regular rhythm.     Heart sounds: S1 normal and S2 normal. No murmur heard.    No friction rub. No S3 or S4 sounds.     Comments: No carotid bruits.  Carotid, radial, femoral, DP and PT pulses normal and equal.    Pulmonary:     Effort:  Pulmonary effort is normal.     Breath sounds: Normal breath sounds and air entry.  Chest:  Breasts:    Right: No mass, nipple discharge or skin change.     Left: No mass, nipple discharge or skin change.  Abdominal:     General: Bowel sounds are normal.     Palpations: Abdomen is soft. There is no hepatomegaly, splenomegaly or mass.     Tenderness: There is no abdominal tenderness.     Hernia: No hernia is present.  Genitourinary:    Comments: Deferred:  had exam 11/2020 with [facility].  Not sure if she will go back again this year, but did not want exam today. Musculoskeletal:        General: Normal range of motion.     Cervical back: Normal range of motion and neck supple.     Right lower leg: No edema.     Left lower leg: No edema.  Lymphadenopathy:     Head:     Right side of head: No submental or submandibular adenopathy.     Left side of head: No submental or submandibular adenopathy.     Cervical: No cervical adenopathy.     Upper Body:     Right upper body: No supraclavicular or axillary adenopathy.     Left upper body: No supraclavicular or axillary adenopathy.     Lower Body: No right inguinal adenopathy. No left inguinal adenopathy.  Skin:    General: Skin is warm.     Capillary Refill: Capillary  refill takes less than 2 seconds.     Findings: No rash.  Neurological:     General: No focal deficit present.     Mental Status: She is alert and oriented to person, place, and time.  Psychiatric:        Attention and Perception: Attention normal.        Mood and Affect: Mood normal.        Speech: Speech normal.        Behavior: Behavior normal. Behavior is cooperative.      Assessment & Plan   CPE without pap She will decide whether to go through [facility] for mammogram/pelvic exam. Labs done at [facility] save for FLP FIT to return in 2 weeks.   No need for another HIB, pneumococcal until 65.  Look into meningococcal:  see addendum below Call back regarding dentist  and influenza/covid vaccines  2.  Hemoglobin [place] Disease:  vaccinations as above and addendum on follow up below.  Following with [facility] hematology[facility]  3.  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 Social isolation and obesity:  referral to [facility]ulce Ortiz  5.  Chronic left knee pain:  water exercise and weight loss.  Has been seen by ortho--Dr Cleophas Dunker here and also [facility] ortho.  Addendum for vaccines in patient with Hgb :    She will need vaccination with Meningo B--primary series, 1 year booster after primary series and every 2-3 years thereafter. Menveo--needs 2nd in her primary series and every 5 years thereafter Pneumococcal not due until 41 yo. No  further HIB needed.  Addendum from [facility] labs:   Also,  from [facility] labs:  no immunity to Hep B and Vitamin D deficient--now taking Calcium and Vitamin D Will recommend Hep B vaccination when next in.  Low vitamin D was in February:  will recheck level when next in.    Addendum for TMJ follow up:   She was seen at oral surgeon on 10/11/2021 and a tooth extracted.  That surgeon does not care for TMJ patients and is sending her to another oral surgeon who does.

## 2021-09-10 NOTE — Patient Instructions (Signed)
Citrated calcium 500 to 600 mg with 200 IU of Vitamin D twice daily.

## 2021-09-11 LAB — LIPID PANEL W/O CHOL/HDL RATIO
Cholesterol, Total: 188 mg/dL (ref 100–199)
HDL: 65 mg/dL (ref >39)
LDL Chol Calc (NIH): 103 mg/dL — ABNORMAL HIGH (ref 0–99)
Triglycerides: 115 mg/dL (ref 0–149)
VLDL Cholesterol Cal: 20 mg/dL (ref 5–40)

## 2021-09-29 ENCOUNTER — Other Ambulatory Visit: Payer: Self-pay | Admitting: Obstetrics and Gynecology

## 2021-09-29 DIAGNOSIS — Z1231 Encounter for screening mammogram for malignant neoplasm of breast: Secondary | ICD-10-CM

## 2021-10-14 NOTE — Congregational Nurse Program (Signed)
Discussed 41 yr old who needs a Doctor, hospital, given bulletin board to cut out for me.  Discussed food banks, she is getting some from [place] and reminded of [place] and [place].  Vinnie Langton, RN  Congregational Nurse  [phone].

## 2021-10-22 DIAGNOSIS — M545 Low back pain, unspecified: Secondary | ICD-10-CM | POA: Insufficient documentation

## 2021-10-22 DIAGNOSIS — E559 Vitamin D deficiency, unspecified: Secondary | ICD-10-CM | POA: Insufficient documentation

## 2021-10-29 ENCOUNTER — Emergency Department (HOSPITAL_COMMUNITY): Admission: EM | Admit: 2021-10-29 | Discharge: 2021-10-29 | Discharge status: Against Medical Advice | Payer: Self-pay

## 2021-10-29 NOTE — ED Notes (Signed)
Pt called 3x for triage, no answer ?

## 2021-11-03 ENCOUNTER — Telehealth: Payer: Self-pay

## 2021-11-03 NOTE — Telephone Encounter (Signed)
Patient would like an appointment for a reoccurring shoulder blade pain. She went to ED but was not seen.

## 2021-11-05 ENCOUNTER — Telehealth: Payer: Self-pay

## 2021-11-05 NOTE — Telephone Encounter (Signed)
11/03/21 Noticed on [facility] record she was in er but left without being seen,  tc to client, she has had chest pain and has been taking aspirin.  Hx of sickle cell disease.  Discussed need to see dr.  Recommended [facility] for ER, sent directions for same.  Forcefully told her she needed to see a doctor.  She said she would.  Vinnie Langton, RN

## 2021-11-12 ENCOUNTER — Encounter: Payer: Self-pay | Admitting: Internal Medicine

## 2021-11-12 ENCOUNTER — Ambulatory Visit: Payer: Self-pay | Admitting: Internal Medicine

## 2021-11-12 VITALS — BP 120/70 | HR 80 | Resp 16 | Ht 68.0 in | Wt 227.0 lb

## 2021-11-12 DIAGNOSIS — S0300XS Dislocation of jaw, unspecified side, sequela: Secondary | ICD-10-CM

## 2021-11-12 DIAGNOSIS — R7989 Other specified abnormal findings of blood chemistry: Secondary | ICD-10-CM

## 2021-11-12 DIAGNOSIS — R0789 Other chest pain: Secondary | ICD-10-CM

## 2021-11-12 DIAGNOSIS — D5720 Sickle-cell/Hb-C disease without crisis: Secondary | ICD-10-CM

## 2021-11-12 MED ORDER — HYDROXYUREA 500 MG PO CAPS: ORAL_CAPSULE | ORAL | Status: AC

## 2021-11-12 NOTE — Progress Notes (Signed)
Subjective:    Patient ID: Taylor Roman, female   DOB: 11/09/1980, 41 y.o.   MRN: [id number]   HPI  Left chest pain:    Has difficulty giving a history on how long this has been going on.  First episode may have been in 2022.  This may have been the 3rd episode.    Left anterior chest pain that radiates straight back to her shoulder blade on left.  Has had this intermittently for at least one year, not clear if more than 3 episodes in total. Last episode was started Oct 27th for which she went to ED, but left without being seen due to not feeling well while waiting Awakened with the pain early in morning, did some stretches and fell back to sleep.  Awakened about 3 hours later with sharper pain from anterior left chest shooting straight to shoulder blade on left.  States hurt to press on her chest, the sharp pain from her chest to back was worse.  Also hurt to take a deep breath.  She felt dizzy, fatigued, global headache which she cannot describe.  Head just felt heavy.   No noted palpitations.  No fever, nausea, or vomiting.   Went to ED as in so much pain and waited 2 hours before coming home.  Took Cyclobenzaprine (which did not try previously) with some mild improvement, but after fell to sleep, awakened in the evening and felt much better.   She took 400 mg of ibuprofen and continued to take once daily. Mild pain continued for a total of 3 days.  Was seen in ED 05/08/2021 with same pain and complaints of associated dyspnea and dizziness.   ECG with somewhat flattened T waves compared to 2021 and 2019 ECGs, Troponin I, CXR, CTA (D dimer slightly elevated at .64)  all normal and negative for AMI, PE or pulmonary finding.  Felt to have atypical CP not related to cardiac ischemia, arrhythmia, or PE.  Other symptoms felt likely due to dehydration and orthostasis.   States for years, she has an episode of this once yearly.   Not clear what vitamins she is taking.  Her hydroxyurea is at 1000  mg daily.  Current Meds  Medication Sig   acetaminophen (TYLENOL) 500 MG tablet Take 500 mg by mouth every 6 (six) hours as needed for mild pain.   Calcium Citrate-Vitamin D 250-200 MG-UNIT TABS 2 tabs by mouth twice daily.   cyclobenzaprine (FLEXERIL) 5 MG tablet 1 tab by mouth at bedtime as needed for back   folic acid (FOLVITE) 672 MCG tablet Take 400 mcg by mouth daily.   hydroxyurea (HYDREA) 500 MG capsule 2 caps by mouth once daily with meal.   ibuprofen (ADVIL) 800 MG tablet Take 1 tablet (800 mg total) by mouth every 6 (six) hours as needed for moderate pain.   vitamin B-12 (CYANOCOBALAMIN) 100 MCG tablet Take 500 mcg by mouth daily. Unsure of dosage   [DISCONTINUED] hydroxyurea (HYDREA) 500 MG capsule Take 500 mg by mouth daily. 2 caps my mouth daily   No Known Allergies   Review of Systems    Objective:   BP 120/70 (BP Location: Right Arm, Patient Position: Sitting, Cuff Size: Normal)   Pulse 80   Resp 16   Ht 5\' 8"  (1.727 m)   Wt 227 lb (103 kg)   LMP 10/23/2021   BMI 34.52 kg/m   Physical Exam NAD HEENT:  PERRL, EOMI, TMs pearly gray, throat without injection  Neck:  Supple, No adenopathy Chest:  CTA.  Minimal tenderness over left anterior chest wall--less severe, but reproduces same pain. CV:  RRR with normal S1 and S2, No S3, S4 or murmur.   Abd:  S, NT, No HSM or mass, + BS LE:  no edema.   Assessment & Plan    Chest wall pain:  Likely a symptom of Hgb [place] disease crisis.  Encouraged her to stay hydrated.  Continues on Hydroxyurea.  Dose clarified in chart today.  As per Hematology.    2.  Vitamin D Deficiency:  noted from labs in Care Everywhere tab from February.  Not clear if she is taking calcium vitamind D combo regularly.  Recheck Vitamin D level.  To bring in all her meds, including vitamins with next visit to see what she is taking.  3.  HM:  needs COVID booster, but ours are out currently.    She did not come in for influenza vaccine clinics.

## 2021-11-13 LAB — VITAMIN D 25 HYDROXY (VIT D DEFICIENCY, FRACTURES): Vit D, 25-Hydroxy: 12 ng/mL — ABNORMAL LOW (ref 30.0–100.0)

## 2021-11-16 NOTE — Telephone Encounter (Signed)
Seen by pcp

## 2021-11-17 MED ORDER — VITAMIN D3 25 MCG (1000 UT) PO CAPS: 1000.0000 [IU] | ORAL_CAPSULE | Freq: Every day | ORAL | 3 refills | Status: DC

## 2021-11-18 ENCOUNTER — Ambulatory Visit: Payer: Self-pay

## 2021-11-25 ENCOUNTER — Emergency Department (HOSPITAL_COMMUNITY)
Admission: EM | Admit: 2021-11-25 | Discharge: 2021-11-25 | Disposition: A | Discharge status: Home / Self Care | Payer: Self-pay | Attending: Emergency Medicine | Admitting: Emergency Medicine

## 2021-11-25 ENCOUNTER — Emergency Department (HOSPITAL_COMMUNITY): Payer: Self-pay

## 2021-11-25 ENCOUNTER — Encounter (HOSPITAL_COMMUNITY): Payer: Self-pay | Admitting: Emergency Medicine

## 2021-11-25 ENCOUNTER — Other Ambulatory Visit: Payer: Self-pay

## 2021-11-25 DIAGNOSIS — S0300XA Dislocation of jaw, unspecified side, initial encounter: Secondary | ICD-10-CM

## 2021-11-25 DIAGNOSIS — X58XXXA Exposure to other specified factors, initial encounter: Secondary | ICD-10-CM | POA: Insufficient documentation

## 2021-11-25 DIAGNOSIS — Y9389 Activity, other specified: Secondary | ICD-10-CM | POA: Insufficient documentation

## 2021-11-25 DIAGNOSIS — S0302XA Dislocation of jaw, left side, initial encounter: Secondary | ICD-10-CM | POA: Insufficient documentation

## 2021-11-25 MED ORDER — PROPOFOL 10 MG/ML IV BOLUS
0.5000 mg/kg | Freq: Once | INTRAVENOUS | Status: DC
  Filled 2021-11-25: qty 20

## 2021-11-25 MED ORDER — PROPOFOL 10 MG/ML IV BOLUS
INTRAVENOUS | Status: AC | PRN
Start: 2021-11-25 — End: 2021-11-25
  Administered 2021-11-25: 100 mg via INTRAVENOUS
  Administered 2021-11-25 (×2): 50 mg via INTRAVENOUS

## 2021-11-25 MED ORDER — MORPHINE SULFATE (PF) 4 MG/ML IV SOLN
4.0000 mg | Freq: Once | INTRAVENOUS | Status: AC
  Administered 2021-11-25: 4 mg via INTRAVENOUS
  Filled 2021-11-25: qty 1

## 2021-11-25 MED ORDER — PROPOFOL 10 MG/ML IV BOLUS
1.0000 mg/kg | Freq: Once | INTRAVENOUS | Status: DC
  Filled 2021-11-25: qty 20

## 2021-11-25 NOTE — ED Notes (Signed)
Pt states she was flossing teeth and felt left jaw dislocate. Airway and breathing WNL, pt able to manage secretions.

## 2021-11-25 NOTE — ED Triage Notes (Signed)
Pt reports her jaw spontaneously dislocated on the left about an hour ago. Reports hx of same. States they usually sedate her and put it back in.

## 2021-11-25 NOTE — Sedation Documentation (Signed)
Procedure complete.

## 2021-11-25 NOTE — Discharge Instructions (Addendum)
As we discussed, your jaw was dislocated today and we have subsequently put it back into correct position.  It is very normal to be sore after this procedure.  I recommend that you minimize opening your mouth fully over the next few days and take Tylenol/ibuprofen as needed for pain.  You may also ice your jaw for additional symptomatic relief.  Follow-up with your primary care doctor for additional needs.  Return if development of any new or worsening symptoms.

## 2021-11-25 NOTE — ED Provider Notes (Signed)
[facility] EMERGENCY DEPARTMENT Provider Note   CSN: [id number] Arrival date & time: 11/25/21  1602     History  Chief Complaint  Patient presents with   Jaw Pain    Taylor Roman is a 41 y.o. female.  HPI     Home Medications Prior to Admission medications   Medication Sig Start Date End Date Taking? Authorizing Provider  acetaminophen (TYLENOL) 500 MG tablet Take 500 mg by mouth every 6 (six) hours as needed for mild pain.    [provider]  Calcium Citrate-Vitamin D 250-200 MG-UNIT TABS 2 tabs by mouth twice daily. 09/25/20   Julieanne Manson, MD  Cholecalciferol (VITAMIN D3) 25 MCG (1000 UT) CAPS Take 1 capsule (1,000 Units total) by mouth daily. 11/17/21   Julieanne Manson, MD  cyclobenzaprine (FLEXERIL) 5 MG tablet 1 tab by mouth at bedtime as needed for back 02/04/21   Julieanne Manson, MD  folic acid (FOLVITE) 800 MCG tablet Take 400 mcg by mouth daily.    [provider]  hydroxyurea (HYDREA) 500 MG capsule 2 caps by mouth once daily with meal. 11/12/21   Julieanne Manson, MD  ibuprofen (ADVIL) 800 MG tablet Take 1 tablet (800 mg total) by mouth every 6 (six) hours as needed for moderate pain. 01/05/20   Gilda Crease, MD  vitamin B-12 (CYANOCOBALAMIN) 100 MCG tablet Take 500 mcg by mouth daily. Unsure of dosage    [provider]  loratadine (CLARITIN) 10 MG tablet Take 1 tablet (10 mg total) by mouth daily. Patient not taking: Reported on 06/06/2019 04/19/19 06/06/19  Julieanne Manson, MD      Allergies    Patient has no known allergies.    Review of Systems   Review of Systems  Physical Exam Updated Vital Signs BP 109/68   Pulse 65   Temp 98.4 F (36.9 C)   Resp (!) 24   LMP 10/23/2021   SpO2 100%  Physical Exam  ED Results / Procedures / Treatments   Labs (all labs ordered are listed, but only abnormal results are displayed) Labs Reviewed - No data to display  EKG None  Radiology CT  Maxillofacial Wo Contrast  Result Date: 11/25/2021 CLINICAL DATA:  Blunt facial trauma. Patient reports feeling left jaw dislocate while flossing teeth. EXAM: CT MAXILLOFACIAL WITHOUT CONTRAST TECHNIQUE: Multidetector CT imaging of the maxillofacial structures was performed. Multiplanar CT image reconstructions were also generated. RADIATION DOSE REDUCTION: This exam was performed according to the departmental dose-optimization program which includes automated exposure control, adjustment of the mA and/or kV according to patient size and/or use of iterative reconstruction technique. COMPARISON:  Face CT 04/16/2021 FINDINGS: Osseous: Anterior inferior dislocation of the left mandibular condyle at the temporomandibular joint. No acute fracture. Similar appearance on prior CT. Right temporomandibular joint is congruent. Remaining facial bones are intact. Absent right first molar. Orbits: Negative. No traumatic or inflammatory finding. Sinuses: Minor mucosal thickening of the maxillary sinuses. Stable ground-glass density in the right frontal sinus over multiple prior exams. Soft tissues: Negative. Limited intracranial: No significant or unexpected finding. IMPRESSION: Anterior inferior dislocation of the left mandibular condyle at the temporomandibular joint. No acute fracture. Electronically Signed   By: Narda Rutherford M.D.   On: 11/25/2021 18:33    Procedures .Sedation  Date/Time: 11/25/2021 7:35 PM  Performed by: Rexford Maus, DO Authorized by: Rexford Maus, DO   Consent:    Consent obtained:  Written   Consent given by:  Patient  Risks discussed:  Allergic reaction, prolonged hypoxia resulting in organ damage, respiratory compromise necessitating ventilatory assistance and intubation, nausea and inadequate sedation   Alternatives discussed:  Analgesia without sedation and anxiolysis Universal protocol:    Procedure explained and questions answered to patient or proxy's  satisfaction: yes     Relevant documents present and verified: yes     Imaging studies available: yes     Immediately prior to procedure, a time out was called: yes     Patient identity confirmed:  Verbally with patient Indications:    Procedure performed:  Dislocation reduction   Procedure necessitating sedation performed by:  Physician performing sedation (Assisted PA in procedure) Pre-sedation assessment:    Time since last food or drink:  4 hr   ASA classification: class 1 - normal, healthy patient     Mouth opening:  3 or more finger widths   Thyromental distance:  4 finger widths   Mallampati score:  II - soft palate, uvula, fauces visible   Neck mobility: normal     Pre-sedation assessments completed and reviewed: pre-procedure airway patency not reviewed, pre-procedure cardiovascular function not reviewed, pre-procedure hydration status not reviewed, pre-procedure mental status not reviewed, pre-procedure nausea and vomiting status not reviewed, pre-procedure pain level not reviewed, pre-procedure respiratory function not reviewed and pre-procedure temperature not reviewed   Immediate pre-procedure details:    Reassessment: Patient reassessed immediately prior to procedure     Reviewed: vital signs, relevant labs/tests and NPO status     Verified: bag valve mask available, emergency equipment available, intubation equipment available, IV patency confirmed, oxygen available and suction available   Procedure details (see MAR for exact dosages):    Preoxygenation:  Nasal cannula   Sedation:  Propofol   Intended level of sedation: deep   Analgesia:  Morphine   Intra-procedure monitoring:  Blood pressure monitoring, cardiac monitor, continuous capnometry, continuous pulse oximetry, frequent LOC assessments and frequent vital sign checks   Intra-procedure events: none     Total Provider sedation time (minutes):  15 Post-procedure details:    Attendance: Constant attendance by certified  staff until patient recovered     Recovery: Patient returned to pre-procedure baseline     Post-sedation assessments completed and reviewed: post-procedure airway patency not reviewed, post-procedure cardiovascular function not reviewed, post-procedure hydration status not reviewed, post-procedure mental status not reviewed, post-procedure nausea and vomiting status not reviewed, pain score not reviewed, post-procedure respiratory function not reviewed and post-procedure temperature not reviewed     Procedure completion:  Tolerated well, no immediate complications     Medications Ordered in ED Medications  propofol (DIPRIVAN) 10 mg/mL bolus/IV push 103 mg (0 mg Intravenous Hold 11/25/21 1934)  propofol (DIPRIVAN) 10 mg/mL bolus/IV push 51.5 mg (0 mg Intravenous Hold 11/25/21 1933)  propofol (DIPRIVAN) 10 mg/mL bolus/IV push (50 mg Intravenous Given 11/25/21 1929)  morphine (PF) 4 MG/ML injection 4 mg (4 mg Intravenous Given 11/25/21 1707)    ED Course/ Medical Decision Making/ A&P                           Medical Decision Making Amount and/or Complexity of Data Reviewed Radiology: ordered.  Risk Prescription drug management.          Final Clinical Impression(s) / ED Diagnoses Final diagnoses:  Dislocation of temporomandibular joint, initial encounter    Rx / DC Orders ED Discharge Orders     None  Rexford Maus, DO 11/25/21 [phone]

## 2021-11-25 NOTE — ED Provider Notes (Signed)
[facility] EMERGENCY DEPARTMENT Provider Note   CSN: [id number] Arrival date & time: 11/25/21  1602     History  Chief Complaint  Patient presents with   Jaw Pain    Taylor Roman is a 41 y.o. female.  Patient with history of recurrent TMJ dislocation presents today with complaints of left jaw pain and concern for recurrent dislocation. She states that immediately prior to arrival today she was flossing and she felt her jaw dislocate. She is unable to fully open her mouth. She denies any trouble swallowing or shortness of breath. States that sympotms are similar to previous dislocations. States that she has been referred to surgeon but has yet to schedule an appointment.  The history is provided by the patient. No language interpreter was used.       Home Medications Prior to Admission medications   Medication Sig Start Date End Date Taking? Authorizing Provider  acetaminophen (TYLENOL) 500 MG tablet Take 500 mg by mouth every 6 (six) hours as needed for mild pain.    [provider]  Calcium Citrate-Vitamin D 250-200 MG-UNIT TABS 2 tabs by mouth twice daily. 09/25/20   Mack Hook, MD  Cholecalciferol (VITAMIN D3) 25 MCG (1000 UT) CAPS Take 1 capsule (1,000 Units total) by mouth daily. 11/17/21   Mack Hook, MD  cyclobenzaprine (FLEXERIL) 5 MG tablet 1 tab by mouth at bedtime as needed for back 02/04/21   Mack Hook, MD  folic acid (FOLVITE) Q000111Q MCG tablet Take 400 mcg by mouth daily.    [provider]  hydroxyurea (HYDREA) 500 MG capsule 2 caps by mouth once daily with meal. 11/12/21   Mack Hook, MD  ibuprofen (ADVIL) 800 MG tablet Take 1 tablet (800 mg total) by mouth every 6 (six) hours as needed for moderate pain. 01/05/20   Orpah Greek, MD  vitamin B-12 (CYANOCOBALAMIN) 100 MCG tablet Take 500 mcg by mouth daily. Unsure of dosage    [provider]  loratadine (CLARITIN) 10 MG tablet Take  1 tablet (10 mg total) by mouth daily. Patient not taking: Reported on 06/06/2019 04/19/19 06/06/19  Mack Hook, MD      Allergies    Patient has no known allergies.    Review of Systems   Review of Systems  All other systems reviewed and are negative.   Physical Exam Updated Vital Signs BP (!) 153/79   Pulse 72   Temp 98.4 F (36.9 C)   Resp 16   LMP 10/23/2021   SpO2 100%  Physical Exam Vitals and nursing note reviewed.  Constitutional:      General: She is not in acute distress.    Appearance: Normal appearance. She is normal weight. She is not ill-appearing, toxic-appearing or diaphoretic.  HENT:     Head: Normocephalic and atraumatic.     Comments: Tenderness noted along the left jaw. Only able to open jaw 1 finger breadth.     Mouth/Throat:     Mouth: Mucous membranes are moist.  Cardiovascular:     Rate and Rhythm: Normal rate.  Pulmonary:     Effort: Pulmonary effort is normal. No respiratory distress.  Musculoskeletal:        General: Normal range of motion.     Cervical back: Normal range of motion.  Skin:    General: Skin is warm and dry.  Neurological:     General: No focal deficit present.     Mental Status: She is alert.  Psychiatric:  Mood and Affect: Mood normal.        Behavior: Behavior normal.     ED Results / Procedures / Treatments   Labs (all labs ordered are listed, but only abnormal results are displayed) Labs Reviewed - No data to display  EKG None  Radiology DG TMJ Open & Close Unilateral Left  Result Date: 11/25/2021 CLINICAL DATA:  [id number] Jaw dislocation [id number] EXAM: LEFT UNILATERAL TEMPOROMANDIBULAR JOINTS COMPARISON:  CT max face 11/25/2021 FINDINGS: Limited evaluation due to overlapping osseous structures and overlying soft tissues. IMPRESSION: Limited evaluation due to overlapping osseous structures and overlying soft tissues. Electronically Signed   By: Tish Frederickson M.D.   On: 11/25/2021 20:21   CT  Maxillofacial Wo Contrast  Result Date: 11/25/2021 CLINICAL DATA:  Blunt facial trauma. Patient reports feeling left jaw dislocate while flossing teeth. EXAM: CT MAXILLOFACIAL WITHOUT CONTRAST TECHNIQUE: Multidetector CT imaging of the maxillofacial structures was performed. Multiplanar CT image reconstructions were also generated. RADIATION DOSE REDUCTION: This exam was performed according to the departmental dose-optimization program which includes automated exposure control, adjustment of the mA and/or kV according to patient size and/or use of iterative reconstruction technique. COMPARISON:  Face CT 04/16/2021 FINDINGS: Osseous: Anterior inferior dislocation of the left mandibular condyle at the temporomandibular joint. No acute fracture. Similar appearance on prior CT. Right temporomandibular joint is congruent. Remaining facial bones are intact. Absent right first molar. Orbits: Negative. No traumatic or inflammatory finding. Sinuses: Minor mucosal thickening of the maxillary sinuses. Stable ground-glass density in the right frontal sinus over multiple prior exams. Soft tissues: Negative. Limited intracranial: No significant or unexpected finding. IMPRESSION: Anterior inferior dislocation of the left mandibular condyle at the temporomandibular joint. No acute fracture. Electronically Signed   By: Narda Rutherford M.D.   On: 11/25/2021 18:33    Procedures Reduction of dislocation  Date/Time: 11/25/2021 8:31 PM  Performed by: Silva Bandy, PA-C Authorized by: Silva Bandy, PA-C  Consent: Verbal consent obtained. Written consent obtained. Risks and benefits: risks, benefits and alternatives were discussed Consent given by: patient Patient understanding: patient states understanding of the procedure being performed Patient consent: the patient's understanding of the procedure matches consent given Procedure consent: procedure consent matches procedure scheduled Relevant documents: relevant  documents present and verified Test results: test results available and properly labeled Imaging studies: imaging studies available Required items: required blood products, implants, devices, and special equipment available Patient identity confirmed: verbally with patient Time out: Immediately prior to procedure a "time out" was called to verify the correct patient, procedure, equipment, support staff and site/side marked as required.  Sedation: Patient sedated: yes  Patient tolerance: patient tolerated the procedure well with no immediate complications Comments: Once adequate sedation was achieved, pressure was applied to the coronoid process on the left and the mandibular ramus on the right, steady gentle pressure applied and palpable reduction of the left TMJ appreciated. ROM improved       Medications Ordered in ED Medications  propofol (DIPRIVAN) 10 mg/mL bolus/IV push 103 mg (has no administration in time range)  propofol (DIPRIVAN) 10 mg/mL bolus/IV push 51.5 mg (has no administration in time range)  morphine (PF) 4 MG/ML injection 4 mg (4 mg Intravenous Given 11/25/21 1707)    ED Course/ Medical Decision Making/ A&P                           Medical Decision Making Amount and/or Complexity of Data Reviewed Radiology: ordered.  Risk Prescription drug management.   Patient presents today with complaints of left sided jaw dislocation since earlier today after flossing. Hx several jaw dislocations in the past requiring sedation and reduction. Ddx: jaw dislocation, fracture. This is not an exhaustive differential.  Patient given morphine for pain with improvement.  CT max/face obtained for further evaluation which reveals  Anterior inferior dislocation of the left mandibular condyle at the temporomandibular joint. No acute fracture.  I have personally reviewed and interpreted this imaging and agree with radiology interpretation.  After CT imaging, patient's jaw  dislocation was reduced per procedure detailed above.  After sedation and reduction, patient states she is feeling much better and only has mild residual soreness.  Did obtain a postreduction x-ray which was inconclusive, however given patient's symptomatic improvement and now full range of motion, no indication for additional imaging to ensure successful reduction.  Patient states that she is feeling much better, she is able to eat and drink and speak normally.  She is therefore stable to be discharged.  Patient is understanding and amenable with plan, educated on red flag symptoms that would prompt immediate return.  Patient discharged in stable condition.   This is a shared visit with supervising physician Dr. Theresia Lo who has independently evaluated patient & provided guidance in evaluation/management/disposition, in agreement with care    Final Clinical Impression(s) / ED Diagnoses Final diagnoses:  Dislocation of temporomandibular joint, initial encounter    Rx / DC Orders ED Discharge Orders     None     An After Visit Summary was printed and given to the patient.     Vear Clock 11/25/21 2042    Elayne Snare K, DO 11/25/21 2046

## 2021-11-25 NOTE — ED Notes (Signed)
Patient transported to X-ray 

## 2021-11-29 ENCOUNTER — Telehealth: Payer: Self-pay

## 2021-11-29 NOTE — Telephone Encounter (Signed)
Patient called to report that her oral surgeon name is Christopher L. [employer]

## 2021-12-09 ENCOUNTER — Ambulatory Visit: Payer: Self-pay | Admitting: Hematology and Oncology

## 2021-12-09 ENCOUNTER — Ambulatory Visit
Admission: RE | Admit: 2021-12-09 | Discharge: 2021-12-09 | Disposition: A | Discharge status: Home / Self Care | Payer: No Typology Code available for payment source | Source: Ambulatory Visit | Attending: Obstetrics and Gynecology | Admitting: Obstetrics and Gynecology

## 2021-12-09 VITALS — BP 128/82 | Wt 234.0 lb

## 2021-12-09 DIAGNOSIS — Z1231 Encounter for screening mammogram for malignant neoplasm of breast: Secondary | ICD-10-CM

## 2021-12-09 DIAGNOSIS — Z124 Encounter for screening for malignant neoplasm of cervix: Secondary | ICD-10-CM

## 2021-12-09 DIAGNOSIS — Z01419 Encounter for gynecological examination (general) (routine) without abnormal findings: Secondary | ICD-10-CM

## 2021-12-09 NOTE — Patient Instructions (Signed)
Taught Taylor Roman about self breast awareness and gave educational materials to take home. Patient did  need a Pap smear today due to last Pap smear was in 2022 per patient. Let her know [facility] will cover Pap smears every 5 years unless has a history of abnormal Pap smears. Referred patient to the [facility] for screening mammogram. Appointment scheduled for 12/09/21. Patient aware of appointment and will be there. Let patient know will follow up with her within the next couple weeks with results. Taylor Roman verbalized understanding. Repeat Pap 2024.  Taylor Lux, NP 9:55 AM

## 2021-12-09 NOTE — Progress Notes (Signed)
Taylor Roman is a 41 y.o. [phone] female who presents to [facility] clinic today with no complaints.    Pap Smear: Pap smear completed today. Last Pap smear was 2022 clinic and was normal. Per patient has history of an abnormal Pap smear. Last Pap smear result is available in Epic. 2017- LGSIL, CIN I/ +HPV; 2018- negative/ -HPV; 2021 negative without HPV; 2022 negative/ -HPV.   Physical exam: Breasts Breasts symmetrical. No skin abnormalities bilateral breasts. No nipple retraction bilateral breasts. No nipple discharge bilateral breasts. No lymphadenopathy. No lumps palpated bilateral breasts.     MM 3D SCREEN BREAST BILATERAL  Result Date: 12/02/2020 CLINICAL DATA:  Screening. EXAM: DIGITAL SCREENING BILATERAL MAMMOGRAM WITH TOMOSYNTHESIS AND CAD TECHNIQUE: Bilateral screening digital craniocaudal and mediolateral oblique mammograms were obtained. Bilateral screening digital breast tomosynthesis was performed. The images were evaluated with computer-aided detection. COMPARISON:  Previous exam(s). ACR Breast Density Category b: There are scattered areas of fibroglandular density. FINDINGS: There are no findings suspicious for malignancy. IMPRESSION: No mammographic evidence of malignancy. A result letter of this screening mammogram will be mailed directly to the patient. RECOMMENDATION: Screening mammogram in one year. (Code:SM-B-01Y) BI-RADS CATEGORY  1: Negative. Electronically Signed   By: Sherian Rein M.D.   On: 12/02/2020 11:02      Pelvic/Bimanual Ext Genitalia No lesions, no swelling and no discharge observed on external genitalia.        Vagina Vagina pink and normal texture. No lesions or discharge observed in vagina.        Cervix Cervix is present. Cervix pink and of normal texture. No discharge observed.    Uterus Uterus is present and palpable. Uterus in normal position and normal size.        Adnexae Bilateral ovaries present and palpable. No tenderness on palpation.          Rectovaginal No rectal exam completed today since patient had no rectal complaints. No skin abnormalities observed on exam.     Smoking History: Patient has never smoked and was not referred to quit line.    Patient Navigation: Patient education provided. Access to services provided for patient through [facility] program. No interpreter provided. No transportation provided   Colorectal Cancer Screening: Per patient has never had colonoscopy completed No complaints today.    Breast and Cervical Cancer Risk Assessment: Patient does not have family history of breast cancer, known genetic mutations, or radiation treatment to the chest before age 34. Patient has history of cervical dysplasia, immunocompromised, or DES exposure in-utero.  Risk Scores as of 12/09/2021     Taylor Roman           5-year 0.69 %   Lifetime 9.5 %            Last calculated by Caprice Red, CMA on 12/09/2021 at  9:39 AM        A: [facility] exam with pap smear No complaints with benign exam.  P: Referred patient to the [facility] for a screening mammogram. Appointment scheduled 12/09/21.  Pascal Lux, NP 12/09/2021 9:43 AM

## 2021-12-14 LAB — CYTOLOGY - PAP
Adequacy: ABSENT
Comment: NEGATIVE
Diagnosis: NEGATIVE
High risk HPV: NEGATIVE

## 2021-12-20 ENCOUNTER — Telehealth: Payer: Self-pay

## 2021-12-20 NOTE — Telephone Encounter (Signed)
I spoke with pt and provided her PAP results. Due to pts hx of abnl PAP, she will repeat her PAP on 3 years. Pt expressed understanding of this information.

## 2022-01-03 NOTE — Congregational Nurse Program (Signed)
HV made to followup and take some food for family.  Children at school.  Taylor Roman has had ER visit for "jaw dislocation".  She was seen by a dentist before and recommended she see someone who could treat possibly surgery.  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  She will ask about the dental clinic at [place] and maybe could be scheduled there for her jaw..  Food delivered for family.  Vinnie Langton, RN, [facility] Nurse, [phone].

## 2022-01-06 ENCOUNTER — Telehealth: Payer: Self-pay

## 2022-01-06 NOTE — Telephone Encounter (Signed)
I have left a voicemail for the pt requesting a return call. When pt returns the call, I need to clarify if she has sx.  Message received from [facility] stating the following: "The attached Pap smear showed yeast. I saw that she has been notified of her normal Pap smear result. Did you check if she was symptomatic for yeast since it showed on her Pap? I did not notice in the note. If she is we can send a prescription for diflucan or advise OTC Monistat".

## 2022-01-10 NOTE — Telephone Encounter (Signed)
I have left pt another message requesting a return call. I also previously sent her a MyChart message and records indicate she has seen my message and her last login was 01/07/22.

## 2022-03-01 ENCOUNTER — Other Ambulatory Visit: Payer: Self-pay

## 2022-03-01 DIAGNOSIS — R7989 Other specified abnormal findings of blood chemistry: Secondary | ICD-10-CM

## 2022-03-02 LAB — VITAMIN D 25 HYDROXY (VIT D DEFICIENCY, FRACTURES): Vit D, 25-Hydroxy: 14.6 ng/mL — ABNORMAL LOW (ref 30.0–100.0)

## 2022-03-14 ENCOUNTER — Encounter: Payer: Self-pay | Admitting: Internal Medicine

## 2022-03-14 ENCOUNTER — Ambulatory Visit: Payer: Self-pay | Admitting: Internal Medicine

## 2022-03-14 VITALS — BP 122/80 | HR 64 | Resp 20 | Ht 68.0 in | Wt 220.0 lb

## 2022-03-14 DIAGNOSIS — R0789 Other chest pain: Secondary | ICD-10-CM

## 2022-03-14 DIAGNOSIS — R7989 Other specified abnormal findings of blood chemistry: Secondary | ICD-10-CM

## 2022-03-14 DIAGNOSIS — D5720 Sickle-cell/Hb-C disease without crisis: Secondary | ICD-10-CM

## 2022-03-14 DIAGNOSIS — S0300XS Dislocation of jaw, unspecified side, sequela: Secondary | ICD-10-CM

## 2022-03-14 DIAGNOSIS — M7730 Calcaneal spur, unspecified foot: Secondary | ICD-10-CM | POA: Insufficient documentation

## 2022-03-14 MED ORDER — CALCIUM CITRATE 250 MG PO TABS: ORAL_TABLET | ORAL | 0 refills | Status: DC

## 2022-03-14 NOTE — Progress Notes (Signed)
    Subjective:    Patient ID: Taylor Roman, female   DOB: 03-02-1980, 42 y.o.   MRN: [id number]   HPI  Did not bring in meds as asked at last visit.   Vitamin D deficiency:  Is missing her D3 replacement 3-4 times weekly.  Had minimal improvement in Vitamin D level to 14.6 from 12.0.  Not taking calcium    2.  Hemoglobin C/sickle cell Disease:  improved pain with hydroxyurea.  Has not met criteria for other meds.  She has not had a recurrence of the chest pain and cardiac work up in ED was negative for cardiac etiology.  Not taking folic acid nor Vitamin B12.   DOES NOT miss her hydroxyurea--takes with evening meal.  Has not needed to take ibuprofen for pain for some time--not clear how long.  She does not use opiates for the pain.    3.  Recurrent jaw dislocation:  her physicians at [facility] have set her up with dental program there.  She was not able to afford the care through orange card here apparently.  Has been difficult to get the plan from the dental clinic in past.  Current Meds  Medication Sig   acetaminophen (TYLENOL) 500 MG tablet Take 500 mg by mouth every 6 (six) hours as needed for mild pain.   Cholecalciferol (VITAMIN D3) 25 MCG (1000 UT) CAPS Take 1 capsule (1,000 Units total) by mouth daily.   hydroxyurea (HYDREA) 500 MG capsule 2 caps by mouth once daily with meal.   ibuprofen (ADVIL) 800 MG tablet Take 1 tablet (800 mg total) by mouth every 6 (six) hours as needed for moderate pain.   No Known Allergies   Review of Systems    Objective:   BP 122/80 (BP Location: Left Arm, Patient Position: Sitting, Cuff Size: Normal)   Pulse 64   Resp 20   Ht 5\' 8"  (1.727 m)   Wt 220 lb (99.8 kg)   BMI 33.45 kg/m   Physical Exam NAD Lungs:  CTA CV:  RRR without murmur or rub.  Radial and DP pulses normal and equal  LE:  No Edema    Assessment & Plan   Vitamin D Deficiency:  Again, encouraged her to take her vitamins regularly.  Will try another round of daily Vitamin  D3 1000 units for 3 months.  Encouraged her to take the calcium twice daily as well.  Discussed importance for prevention of osteoporosis.  2.  Hgb [place] disease:  Encouraged her to at least drink water during the day with Ramadan.  She is planning to text Hematology at [facility] to get their opinion.  Encouraged her to do so.   She has not had another episode of chest pain since November.   Encouraged her also to take her folic acid and Vitamin B12 until Hematology recommends discontinuing.    3.  Recurrent Jaw dislocation:  [facility] hematology has referred her to dental there.  Hopefully, will get this addressed as well.    4.  HM:  did not come in for influenza clinics.  COVID not available today.  Did not start Hep B vaccination--will initiate at next visit.

## 2022-03-30 ENCOUNTER — Telehealth: Payer: Self-pay

## 2022-03-30 NOTE — Telephone Encounter (Signed)
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 They also want [place] to fax results to Dr Loura Back at Fax: [phone].  We will call her to get her scheduled

## 2022-04-05 NOTE — Telephone Encounter (Signed)
Voicemail left asking patient to return our call to get her scheduled

## 2022-04-06 NOTE — Congregational Nurse Program (Signed)
HV, Client home with boys, all in school now.  Noted she is having knee pain, going to her sickle cell appt in [facility] also is referred to ortho clinic,  Vinnie Langton, RN, congregational Nurse [phone]

## 2022-04-07 ENCOUNTER — Other Ambulatory Visit: Payer: Self-pay

## 2022-04-07 DIAGNOSIS — D5720 Sickle-cell/Hb-C disease without crisis: Secondary | ICD-10-CM

## 2022-04-08 LAB — CBC WITH DIFFERENTIAL/PLATELET
Basophils Absolute: 0.1 10*3/uL (ref 0.0–0.2)
Basos: 1 %
EOS (ABSOLUTE): 0.3 10*3/uL (ref 0.0–0.4)
Eos: 5 %
Hematocrit: 27.7 % — ABNORMAL LOW (ref 34.0–46.6)
Hemoglobin: 8.8 g/dL — ABNORMAL LOW (ref 11.1–15.9)
Immature Grans (Abs): 0 10*3/uL (ref 0.0–0.1)
Immature Granulocytes: 0 %
Lymphocytes Absolute: 1.8 10*3/uL (ref 0.7–3.1)
Lymphs: 33 %
MCH: 26.7 pg (ref 26.6–33.0)
MCHC: 31.8 g/dL (ref 31.5–35.7)
MCV: 84 fL (ref 79–97)
Monocytes Absolute: 0.3 10*3/uL (ref 0.1–0.9)
Monocytes: 6 %
Neutrophils Absolute: 3 10*3/uL (ref 1.4–7.0)
Neutrophils: 55 %
Platelets: 220 10*3/uL (ref 150–450)
RBC: 3.29 x10E6/uL — ABNORMAL LOW (ref 3.77–5.28)
RDW: 19.3 % — ABNORMAL HIGH (ref 11.7–15.4)
WBC: 5.4 10*3/uL (ref 3.4–10.8)

## 2022-04-09 LAB — SPECIMEN STATUS REPORT

## 2022-04-09 LAB — RETICULOCYTES: Retic Ct Pct: 2.9 % — ABNORMAL HIGH (ref 0.6–2.6)

## 2022-05-09 ENCOUNTER — Other Ambulatory Visit: Payer: Self-pay

## 2022-05-12 ENCOUNTER — Other Ambulatory Visit: Payer: Self-pay

## 2022-05-17 ENCOUNTER — Other Ambulatory Visit: Payer: Self-pay

## 2022-05-17 DIAGNOSIS — D5720 Sickle-cell/Hb-C disease without crisis: Secondary | ICD-10-CM

## 2022-05-18 LAB — CBC WITH DIFFERENTIAL/PLATELET
Basophils Absolute: 0.1 10*3/uL (ref 0.0–0.2)
Basos: 1 %
EOS (ABSOLUTE): 0.3 10*3/uL (ref 0.0–0.4)
Eos: 3 %
Hematocrit: 31.2 % — ABNORMAL LOW (ref 34.0–46.6)
Hemoglobin: 9.8 g/dL — ABNORMAL LOW (ref 11.1–15.9)
Immature Grans (Abs): 0 10*3/uL (ref 0.0–0.1)
Immature Granulocytes: 0 %
Lymphocytes Absolute: 2.3 10*3/uL (ref 0.7–3.1)
Lymphs: 29 %
MCH: 26.3 pg — ABNORMAL LOW (ref 26.6–33.0)
MCHC: 31.4 g/dL — ABNORMAL LOW (ref 31.5–35.7)
MCV: 84 fL (ref 79–97)
Monocytes Absolute: 0.4 10*3/uL (ref 0.1–0.9)
Monocytes: 5 %
Neutrophils Absolute: 4.7 10*3/uL (ref 1.4–7.0)
Neutrophils: 62 %
Platelets: 234 10*3/uL (ref 150–450)
RBC: 3.72 x10E6/uL — ABNORMAL LOW (ref 3.77–5.28)
RDW: 19.4 % — ABNORMAL HIGH (ref 11.7–15.4)
WBC: 7.7 10*3/uL (ref 3.4–10.8)

## 2022-05-18 LAB — RETICULOCYTES: Retic Ct Pct: 3.5 % — ABNORMAL HIGH (ref 0.6–2.6)

## 2022-06-14 ENCOUNTER — Other Ambulatory Visit: Payer: Self-pay

## 2022-06-14 DIAGNOSIS — D5720 Sickle-cell/Hb-C disease without crisis: Secondary | ICD-10-CM

## 2022-06-14 DIAGNOSIS — R7989 Other specified abnormal findings of blood chemistry: Secondary | ICD-10-CM

## 2022-06-15 LAB — RETICULOCYTES: Retic Ct Pct: 2.9 % — ABNORMAL HIGH (ref 0.6–2.6)

## 2022-06-15 LAB — CBC WITH DIFFERENTIAL/PLATELET
Basophils Absolute: 0.1 10*3/uL (ref 0.0–0.2)
Basos: 1 %
EOS (ABSOLUTE): 0.2 10*3/uL (ref 0.0–0.4)
Eos: 4 %
Hematocrit: 29 % — ABNORMAL LOW (ref 34.0–46.6)
Hemoglobin: 9.3 g/dL — ABNORMAL LOW (ref 11.1–15.9)
Immature Grans (Abs): 0 10*3/uL (ref 0.0–0.1)
Immature Granulocytes: 0 %
Lymphocytes Absolute: 1.8 10*3/uL (ref 0.7–3.1)
Lymphs: 35 %
MCH: 26.3 pg — ABNORMAL LOW (ref 26.6–33.0)
MCHC: 32.1 g/dL (ref 31.5–35.7)
MCV: 82 fL (ref 79–97)
Monocytes Absolute: 0.3 10*3/uL (ref 0.1–0.9)
Monocytes: 5 %
Neutrophils Absolute: 2.9 10*3/uL (ref 1.4–7.0)
Neutrophils: 55 %
Platelets: 226 10*3/uL (ref 150–450)
RBC: 3.54 x10E6/uL — ABNORMAL LOW (ref 3.77–5.28)
RDW: 18.3 % — ABNORMAL HIGH (ref 11.7–15.4)
WBC: 5.2 10*3/uL (ref 3.4–10.8)

## 2022-06-15 LAB — VITAMIN D 25 HYDROXY (VIT D DEFICIENCY, FRACTURES): Vit D, 25-Hydroxy: 26 ng/mL — ABNORMAL LOW (ref 30.0–100.0)

## 2022-08-09 ENCOUNTER — Emergency Department (HOSPITAL_COMMUNITY)
Admission: EM | Admit: 2022-08-09 | Discharge: 2022-08-10 | Disposition: A | Discharge status: Home / Self Care | Payer: No Typology Code available for payment source | Attending: Emergency Medicine | Admitting: Emergency Medicine

## 2022-08-09 ENCOUNTER — Encounter (HOSPITAL_COMMUNITY): Payer: Self-pay | Admitting: Emergency Medicine

## 2022-08-09 ENCOUNTER — Other Ambulatory Visit: Payer: Self-pay

## 2022-08-09 DIAGNOSIS — D57219 Sickle-cell/Hb-C disease with crisis, unspecified: Secondary | ICD-10-CM | POA: Insufficient documentation

## 2022-08-09 DIAGNOSIS — D5700 Hb-SS disease with crisis, unspecified: Secondary | ICD-10-CM

## 2022-08-09 LAB — COMPREHENSIVE METABOLIC PANEL
ALT: 17 U/L (ref 0–44)
AST: 20 U/L (ref 15–41)
Albumin: 3.9 g/dL (ref 3.5–5.0)
Alkaline Phosphatase: 43 U/L (ref 38–126)
Anion gap: 8 (ref 5–15)
BUN: 7 mg/dL (ref 6–20)
CO2: 25 mmol/L (ref 22–32)
Calcium: 9 mg/dL (ref 8.9–10.3)
Chloride: 105 mmol/L (ref 98–111)
Creatinine, Ser: 0.64 mg/dL (ref 0.44–1.00)
GFR, Estimated: >60 mL/min (ref >60)
Glucose, Bld: 108 mg/dL — ABNORMAL HIGH (ref 70–99)
Potassium: 3.4 mmol/L — ABNORMAL LOW (ref 3.5–5.1)
Sodium: 138 mmol/L (ref 135–145)
Total Bilirubin: 0.9 mg/dL (ref 0.3–1.2)
Total Protein: 7.4 g/dL (ref 6.5–8.1)

## 2022-08-09 LAB — CBC WITH DIFFERENTIAL/PLATELET
Abs Immature Granulocytes: 0.03 10*3/uL (ref 0.00–0.07)
Basophils Absolute: 0.1 10*3/uL (ref 0.0–0.1)
Basophils Relative: 1 %
Eosinophils Absolute: 0.2 10*3/uL (ref 0.0–0.5)
Eosinophils Relative: 3 %
HCT: 27.7 % — ABNORMAL LOW (ref 36.0–46.0)
Hemoglobin: 9.6 g/dL — ABNORMAL LOW (ref 12.0–15.0)
Immature Granulocytes: 0 %
Lymphocytes Relative: 33 %
Lymphs Abs: 2.5 10*3/uL (ref 0.7–4.0)
MCH: 26.1 pg (ref 26.0–34.0)
MCHC: 34.7 g/dL (ref 30.0–36.0)
MCV: 75.3 fL — ABNORMAL LOW (ref 80.0–100.0)
Monocytes Absolute: 0.4 10*3/uL (ref 0.1–1.0)
Monocytes Relative: 5 %
Neutro Abs: 4.5 10*3/uL (ref 1.7–7.7)
Neutrophils Relative %: 58 %
Platelets: 226 10*3/uL (ref 150–400)
RBC: 3.68 MIL/uL — ABNORMAL LOW (ref 3.87–5.11)
RDW: 17.8 % — ABNORMAL HIGH (ref 11.5–15.5)
WBC: 7.6 10*3/uL (ref 4.0–10.5)
nRBC: 0 % (ref 0.0–0.2)

## 2022-08-09 LAB — HCG, SERUM, QUALITATIVE: Preg, Serum: NEGATIVE

## 2022-08-09 LAB — RETICULOCYTES
Immature Retic Fract: 16.4 % — ABNORMAL HIGH (ref 2.3–15.9)
RBC.: 3.67 MIL/uL — ABNORMAL LOW (ref 3.87–5.11)
Retic Count, Absolute: 98.7 10*3/uL (ref 19.0–186.0)
Retic Ct Pct: 2.7 % (ref 0.4–3.1)

## 2022-08-09 MED ORDER — HYDROMORPHONE HCL 1 MG/ML IJ SOLN: 0.5000 mg | INTRAMUSCULAR | Status: DC

## 2022-08-09 MED ORDER — MORPHINE SULFATE (PF) 4 MG/ML IV SOLN
4.0000 mg | INTRAVENOUS | Status: AC
  Administered 2022-08-10: 4 mg via INTRAVENOUS
  Filled 2022-08-09: qty 1

## 2022-08-09 MED ORDER — MORPHINE SULFATE (PF) 2 MG/ML IV SOLN
2.0000 mg | INTRAVENOUS | Status: AC
  Administered 2022-08-10: 2 mg via INTRAVENOUS
  Filled 2022-08-09: qty 1

## 2022-08-09 MED ORDER — KETOROLAC TROMETHAMINE 15 MG/ML IJ SOLN
15.0000 mg | INTRAMUSCULAR | Status: AC
  Administered 2022-08-10: 15 mg via INTRAVENOUS
  Filled 2022-08-09: qty 1

## 2022-08-09 NOTE — ED Triage Notes (Signed)
Pt with sickle cell crisis began yesterday, right leg pain.  Pt states she is not followed at the sickle cell clinic.

## 2022-08-10 MED ORDER — MORPHINE SULFATE (PF) 4 MG/ML IV SOLN
4.0000 mg | Freq: Once | INTRAVENOUS | Status: AC
  Administered 2022-08-10: 4 mg via INTRAVENOUS
  Filled 2022-08-10: qty 1

## 2022-08-10 NOTE — ED Provider Notes (Signed)
[place] EMERGENCY DEPARTMENT AT [facility] Provider Note   CSN: [id number] Arrival date & time: 08/09/22  2151     History  Chief Complaint  Patient presents with   Sickle Cell Pain Crisis    Taylor Roman is a 42 y.o. female with past medical history significant for sickle cell anemia presents to the ED complaining of right leg pain related to sickle cell pain crisis that began yesterday.  Patient states she took her home medication without relief.  Patient does not regularly take opiates, but did take an oxycodone without much relief.  She receives her care through [facility] and has a care plan card with her for sickle cell pain crisis treatment.  Denies chest pain, shortness of breath, leg swelling, joint swelling, fever.         Home Medications Prior to Admission medications   Medication Sig Start Date End Date Taking? Authorizing Provider  acetaminophen (TYLENOL) 500 MG tablet Take 500 mg by mouth every 6 (six) hours as needed for mild pain.    [provider]  Calcium Citrate 250 MG TABS 2 tabs twice daily 03/14/22   Julieanne Manson, MD  Cholecalciferol (VITAMIN D3) 25 MCG (1000 UT) CAPS Take 1 capsule (1,000 Units total) by mouth daily. 11/17/21   Julieanne Manson, MD  folic acid (FOLVITE) 800 MCG tablet Take 400 mcg by mouth daily.    [provider]  hydroxyurea (HYDREA) 500 MG capsule 2 caps by mouth once daily with meal. 11/12/21   Julieanne Manson, MD  ibuprofen (ADVIL) 800 MG tablet Take 1 tablet (800 mg total) by mouth every 6 (six) hours as needed for moderate pain. 01/05/20   Gilda Crease, MD  vitamin B-12 (CYANOCOBALAMIN) 100 MCG tablet Take 500 mcg by mouth daily. Unsure of dosage Patient not taking: Reported on 12/09/2021    [provider]  loratadine (CLARITIN) 10 MG tablet Take 1 tablet (10 mg total) by mouth daily. Patient not taking: Reported on 06/06/2019 04/19/19 06/06/19  Julieanne Manson, MD       Allergies    Patient has no known allergies.    Review of Systems   Review of Systems  Constitutional:  Negative for fever.  Respiratory:  Negative for shortness of breath.   Cardiovascular:  Negative for chest pain and leg swelling.  Musculoskeletal:  Positive for myalgias (right leg pain). Negative for joint swelling.    Physical Exam Updated Vital Signs BP 123/72 (BP Location: Left Arm)   Pulse (!) 55   Temp 97.7 F (36.5 C) (Oral)   Resp 16   SpO2 100%  Physical Exam Vitals and nursing note reviewed.  Constitutional:      General: She is not in acute distress.    Appearance: Normal appearance. She is not ill-appearing or diaphoretic.  Cardiovascular:     Rate and Rhythm: Normal rate and regular rhythm.     Pulses: Normal pulses.  Pulmonary:     Effort: Pulmonary effort is normal. No tachypnea or respiratory distress.     Breath sounds: Normal breath sounds and air entry.  Abdominal:     General: Abdomen is flat.     Palpations: Abdomen is soft.     Tenderness: There is no abdominal tenderness.  Musculoskeletal:     Comments: Right leg with generalized tenderness on palpation.  DP pulse 2+.  Sensation grossly intact.  No obvious joint swelling or increased warmth.   Skin:    General: Skin is warm  and dry.     Capillary Refill: Capillary refill takes less than 2 seconds.  Neurological:     Mental Status: She is alert. Mental status is at baseline.  Psychiatric:        Mood and Affect: Mood normal.        Behavior: Behavior normal.     ED Results / Procedures / Treatments   Labs (all labs ordered are listed, but only abnormal results are displayed) Labs Reviewed  COMPREHENSIVE METABOLIC PANEL - Abnormal; Notable for the following components:      Result Value   Potassium 3.4 (*)    Glucose, Bld 108 (*)    All other components within normal limits  CBC WITH DIFFERENTIAL/PLATELET - Abnormal; Notable for the following components:   RBC 3.68 (*)     Hemoglobin 9.6 (*)    HCT 27.7 (*)    MCV 75.3 (*)    RDW 17.8 (*)    All other components within normal limits  RETICULOCYTES - Abnormal; Notable for the following components:   RBC. 3.67 (*)    Immature Retic Fract 16.4 (*)    All other components within normal limits  HCG, SERUM, QUALITATIVE    EKG None  Radiology No results found.  Procedures Procedures    Medications Ordered in ED Medications  morphine (PF) 2 MG/ML injection 2 mg (2 mg Intravenous Given 08/10/22 0024)  morphine (PF) 4 MG/ML injection 4 mg (4 mg Intravenous Given 08/10/22 0059)  ketorolac (TORADOL) 15 MG/ML injection 15 mg (15 mg Intravenous Given 08/10/22 0014)  morphine (PF) 4 MG/ML injection 4 mg (4 mg Intravenous Given 08/10/22 0218)    ED Course/ Medical Decision Making/ A&P                                 Medical Decision Making Amount and/or Complexity of Data Reviewed Labs: ordered.  Risk Prescription drug management.   This patient presents to the ED with chief complaint(s) of sickle cell pain crisis, right leg pain with pertinent past medical history of sickle cell anemia.  The complaint involves an extensive differential diagnosis and also carries with it a high risk of complications and morbidity.    The initial plan is to provide pain relief based on patient's sickle cell crisis plan  Additional history obtained: Records reviewed  - patient presents with card that lays out her plan for treatment.  Patient is not opiate tolerant and rarely uses opiates or the ED.  Plan advises to give 15 mg toradol and 2-4 mg morphine every 30 min x3 doses with 25-50% increase each dose if needed.   Initial Assessment:   Exam significant for overall well appearing patient who is not in acute distress.  Heart rate is normal with regular rhythm. Lungs clear to auscultation bilaterally.  Right leg with generalized tenderness on palpation.  No obvious joint swelling or increased warmth.  DP pulse is 2+.   Sensation grossly intact.    Independent ECG/labs interpretation:  The following labs were independently interpreted:  CBC with anemia, at patient's baseline.  Metabolic panel with mild hypokalemia, no other electrolyte disturbance.  Renal and hepatic function both normal.    Treatment and Reassessment: Patient was given toradol and 3 doses of morphine with improvement in her pain symptoms.   Disposition:   Advised patient to follow-up with her sickle cell provider.  The patient has been appropriately medically screened and/or  stabilized in the ED. I have low suspicion for any other emergent medical condition which would require further screening, evaluation or treatment in the ED or require inpatient management. At time of discharge the patient is hemodynamically stable and in no acute distress. I have discussed work-up results and diagnosis with patient and answered all questions. Patient is agreeable with discharge plan. We discussed strict return precautions for returning to the emergency department and they verbalized understanding.            Final Clinical Impression(s) / ED Diagnoses Final diagnoses:  Sickle cell pain crisis [facility])    Rx / DC Orders ED Discharge Orders     None         Lenard Simmer, PA-C 08/10/22 0650    Sabas Sous, MD 08/10/22 [phone]

## 2022-08-10 NOTE — Discharge Instructions (Addendum)
Thank you for allowing [place] to be a part of your care today.  You were evaluated in the ED for sickle cell pain crisis.    Please call your sickle cell provider tomorrow to arrange follow up.    Return to the ED if you develop sudden worsening of your symptoms or if you have any new concerns.

## 2022-09-12 ENCOUNTER — Other Ambulatory Visit: Payer: Self-pay

## 2022-09-12 DIAGNOSIS — Z0000 Encounter for general adult medical examination without abnormal findings: Secondary | ICD-10-CM

## 2022-09-12 DIAGNOSIS — D5720 Sickle-cell/Hb-C disease without crisis: Secondary | ICD-10-CM

## 2022-09-12 DIAGNOSIS — R7989 Other specified abnormal findings of blood chemistry: Secondary | ICD-10-CM

## 2022-09-13 ENCOUNTER — Encounter: Payer: Self-pay | Admitting: Internal Medicine

## 2022-09-13 LAB — HEMOGLOBIN A1C
Est. average glucose Bld gHb Est-mCnc: 82 mg/dL
Hgb A1c MFr Bld: 4.5 % — ABNORMAL LOW (ref 4.8–5.6)

## 2022-09-13 LAB — COMPREHENSIVE METABOLIC PANEL
ALT: 14 IU/L (ref 0–32)
AST: 21 IU/L (ref 0–40)
Albumin: 4.6 g/dL (ref 3.9–4.9)
Alkaline Phosphatase: 57 IU/L (ref 44–121)
BUN/Creatinine Ratio: 13 (ref 9–23)
BUN: 8 mg/dL (ref 6–24)
Bilirubin Total: 0.7 mg/dL (ref 0.0–1.2)
CO2: 25 mmol/L (ref 20–29)
Calcium: 9.7 mg/dL (ref 8.7–10.2)
Chloride: 100 mmol/L (ref 96–106)
Creatinine, Ser: 0.61 mg/dL (ref 0.57–1.00)
Globulin, Total: 2.8 g/dL (ref 1.5–4.5)
Glucose: 102 mg/dL — ABNORMAL HIGH (ref 70–99)
Potassium: 4.1 mmol/L (ref 3.5–5.2)
Sodium: 137 mmol/L (ref 134–144)
Total Protein: 7.4 g/dL (ref 6.0–8.5)
eGFR: 114 mL/min/{1.73_m2} (ref >59)

## 2022-09-13 LAB — CBC WITH DIFFERENTIAL/PLATELET
Basophils Absolute: 0.1 10*3/uL (ref 0.0–0.2)
Basos: 1 %
EOS (ABSOLUTE): 0.2 10*3/uL (ref 0.0–0.4)
Eos: 4 %
Hematocrit: 31.2 % — ABNORMAL LOW (ref 34.0–46.6)
Hemoglobin: 9.7 g/dL — ABNORMAL LOW (ref 11.1–15.9)
Immature Grans (Abs): 0 10*3/uL (ref 0.0–0.1)
Immature Granulocytes: 0 %
Lymphocytes Absolute: 2.2 10*3/uL (ref 0.7–3.1)
Lymphs: 32 %
MCH: 27.3 pg (ref 26.6–33.0)
MCHC: 31.1 g/dL — ABNORMAL LOW (ref 31.5–35.7)
MCV: 88 fL (ref 79–97)
Monocytes Absolute: 0.4 10*3/uL (ref 0.1–0.9)
Monocytes: 6 %
Neutrophils Absolute: 3.8 10*3/uL (ref 1.4–7.0)
Neutrophils: 57 %
Platelets: 210 10*3/uL (ref 150–450)
RBC: 3.55 x10E6/uL — ABNORMAL LOW (ref 3.77–5.28)
RDW: 20.2 % — ABNORMAL HIGH (ref 11.7–15.4)
WBC: 6.6 10*3/uL (ref 3.4–10.8)

## 2022-09-13 LAB — LIPID PANEL W/O CHOL/HDL RATIO
Cholesterol, Total: 184 mg/dL (ref 100–199)
HDL: 59 mg/dL (ref >39)
LDL Chol Calc (NIH): 100 mg/dL — ABNORMAL HIGH (ref 0–99)
Triglycerides: 144 mg/dL (ref 0–149)
VLDL Cholesterol Cal: 25 mg/dL (ref 5–40)

## 2022-09-13 LAB — VITAMIN D 25 HYDROXY (VIT D DEFICIENCY, FRACTURES): Vit D, 25-Hydroxy: 28.2 ng/mL — ABNORMAL LOW (ref 30.0–100.0)

## 2022-09-15 ENCOUNTER — Encounter: Payer: Self-pay | Admitting: Internal Medicine

## 2022-09-15 ENCOUNTER — Ambulatory Visit: Payer: Self-pay | Admitting: Internal Medicine

## 2022-09-15 VITALS — BP 130/80 | HR 68 | Resp 16 | Ht 68.0 in | Wt 234.5 lb

## 2022-09-15 DIAGNOSIS — D5720 Sickle-cell/Hb-C disease without crisis: Secondary | ICD-10-CM

## 2022-09-15 DIAGNOSIS — Z0000 Encounter for general adult medical examination without abnormal findings: Secondary | ICD-10-CM

## 2022-09-15 DIAGNOSIS — R7989 Other specified abnormal findings of blood chemistry: Secondary | ICD-10-CM

## 2022-09-15 DIAGNOSIS — S0300XS Dislocation of jaw, unspecified side, sequela: Secondary | ICD-10-CM

## 2022-09-15 DIAGNOSIS — Z23 Encounter for immunization: Secondary | ICD-10-CM

## 2022-09-15 DIAGNOSIS — R079 Chest pain, unspecified: Secondary | ICD-10-CM

## 2022-09-15 NOTE — Progress Notes (Signed)
 Subjective:    Patient ID: Taylor Roman, female   DOB: Feb 16, 1980, 42 y.o.   MRN: [id number]   HPI  CPE with pap  1.  Pap:  Normal 12/2021 with [facility].  She did have yeast, but states she does not have symptoms then or now.  Pap was otherwise normal.  History of abnormal pap in 2017 with conization.  2.  Mammogram:  Last 12/2021 and normal.  Again, through [facility] exam.    3.  Osteoprevention:  Vitamin D remains mildly low at 28.2.  She takes Calcium, uncertain mg once daily.  She thinks the calcium has Vitamin D with it, but again uncertain.  She also is taking Vitamin D3 1000 units daily, but is not consistent with it.  Walks daily, but only 10 minutes.  4.  Guaiac Cards/FIT:  last performed 11/2020 and negative.  5.  Colonoscopy:  Never.  No family history of colon cancer.    6.  Immunizations:  Has not had influenza or COVID boosters this year. Immunization History  Administered Date(s) Administered   HIB (PRP-T) 02/26/2021   Influenza Inj Mdck Quad Pf 11/21/2018   Influenza Split 10/06/2011   Influenza,inj,Quad PF,6+ Mos 01/15/2015, 01/29/2021, 09/23/2021   Influenza-Unspecified 01/15/2015, 11/21/2018   Meningococcal Mcv4o 06/16/2021, 12/29/2021   Moderna Covid-19 Vaccine Bivalent Booster 28yrs & up 10/09/2020   Moderna Sars-Covid-2 Vaccination 09/25/2019, 10/28/2019, 04/27/2020   PNEUMOCOCCAL CONJUGATE-20 02/26/2021   Tdap 10/20/2011, 03/23/2015     7.  Glucose/Cholesterol:  A1C is low, likely due to hemoglobinopathy.  Cholesterol panel fine. Lipid Panel     Component Value Date/Time   CHOL 184 09/12/2022 0852   TRIG 144 09/12/2022 0852   HDL 59 09/12/2022 0852   LDLCALC 100 (H) 09/12/2022 0852   LABVLDL 25 09/12/2022 0852   8.  Hgb C/SS disease:  hydroxyurea controlling crises fairly well, though required ED visit last month for a crisis when overtired.  Otherwise, she does not know what triggered the crisis.  She sees hematology at [facility] every 3  months.    9.  Recurrent TMJ dislocation:  she has an appt with oral surgery at [facility] to have this evaluated.  Has not had dislocation since 11/2021.    Current Meds  Medication Sig   acetaminophen (TYLENOL) 500 MG tablet Take 500 mg by mouth every 6 (six) hours as needed for mild pain.   Calcium Citrate 250 MG TABS 2 tabs twice daily   Cholecalciferol (VITAMIN D3) 25 MCG (1000 UT) CAPS Take 1 capsule (1,000 Units total) by mouth daily.   folic acid (FOLVITE) 800 MCG tablet Take 400 mcg by mouth daily.   hydroxyurea (HYDREA) 500 MG capsule 2 caps by mouth once daily with meal.   ibuprofen (ADVIL) 800 MG tablet Take 1 tablet (800 mg total) by mouth every 6 (six) hours as needed for moderate pain.   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  To have repeat pap in JUne 2018 at [facility]   Past Surgical History:  Procedure Laterality Date   CERVICAL CONIZATION W/BX N/A 11/03/2015  Procedure: CONIZATION CERVIX WITH BIOPSY;  Surgeon: Slayton Bing, MD;  Location: [facility];  Service: Gynecology;  Laterality: N/A;   HYSTEROSCOPY WITH D & C N/A 05/10/2017   Procedure: DILATATION AND CURETTAGE /HYSTEROSCOPY;  Surgeon: Conan Bowens, MD;  Location: [facility];  Service: Gynecology;  Laterality: N/A;   Family History  Problem Relation Age of Onset   Cancer Mother        Hepatic carcinoma?  No history of viral hepatitis she is aware of   Diabetes Father    Other Sister        Hemoglobin [place] Disease   Other Brother        Hemoglobin [place] Disease   Learning disabilities Maternal Grandmother    Diabetes Maternal Grandmother    Anesthesia problems Neg Hx    Breast cancer Neg Hx    Social  History   Socioeconomic History   Marital status: Media planner    Spouse name: [facility]   Number of children: 3   Years of education: 12   Highest education level: Not on file  Occupational History   Occupation: Hair care  Tobacco Use   Smoking status: Never    Passive exposure: Never   Smokeless tobacco: Never  Vaping Use   Vaping status: Never Used  Substance and Sexual Activity   Alcohol use: No   Drug use: No   Sexual activity: Yes    Birth control/protection: None  Other Topics Concern   Not on file  Social History Narrative   Originally from [place]   Speaks [place], [place] and [place]   Came to [employer]. In 2009   Lives with her 3 young sons.   Father of children,Maliki, her boyfriend now lives with the family as well.   Social Drivers of Corporate investment banker Strain: Low Risk  (09/15/2022)   Overall Financial Resource Strain (CARDIA)    Difficulty of Paying Living Expenses: Not hard at all  Food Insecurity: No Food Insecurity (02/21/2023)   Hunger Vital Sign    Worried About Running Out of Food in the Last Year: Never true    Ran Out of Food in the Last Year: Never true  Transportation Needs: No Transportation Needs (02/21/2023)   PRAPARE - Administrator, Civil Service (Medical): No    Lack of Transportation (Non-Medical): No  Physical Activity: Insufficiently Active (12/20/2016)   Exercise Vital Sign    Days of Exercise per Week: 1 day    Minutes of Exercise per Session: 20 min  Stress: No Stress Concern Present (12/20/2016)   [employer] of Occupational Health - Occupational Stress Questionnaire    Feeling of Stress : Not at all  Social Connections: Moderately Isolated (12/20/2016)   Social Connection and Isolation Panel [NHANES]    Frequency of Communication with Friends and Family: More than three times a week    Frequency of Social Gatherings with Friends and Family: More than three times a week    Attends Religious Services: Never     Database administrator or Organizations: No    Attends Banker Meetings: Never    Marital Status: Never married  Intimate Partner Violence: Not At Risk (09/15/2022)   Humiliation, Afraid, Rape, and Kick questionnaire    Fear of Current or Ex-Partner: No    Emotionally Abused: No    Physically Abused: No    Sexually Abused: No      Review of Systems  HENT:  Negative for dental problem (Currently without orange card.).   Cardiovascular:  Positive for chest pain (Left anterior chest pain radiating to left shoulder blade area.  Hurts to breathe--not enough air to talk when occurs.  Does not seem to occur with activity.  Usually only at rest.  Hurts to touch--chest is tender when occurs.  No arm and leg pain w/this.) and palpitations (once a quarter.  Fast HB for 5 minutes.  No presyncope.). Negative for leg swelling.  Gastrointestinal:  Negative for abdominal pain (No abdominal pain with SS/Hep C crises) and blood in stool (no melena).  Genitourinary:  Negative for vaginal discharge.  Neurological:  Negative for weakness and numbness.  Psychiatric/Behavioral:  Negative for dysphoric mood. The patient is not nervous/anxious.       Objective:   BP 130/80 (BP Location: Left Arm, Patient Position: Sitting, Cuff Size: Normal)   Pulse 68   Resp 16   Ht 5\' 8"  (1.727 m)   Wt 234 lb 8 oz (106.4 kg)   LMP 08/26/2022 (Exact Date)   BMI 35.66 kg/m   Physical Exam Constitutional:      Appearance: She is obese.  HENT:     Head: Normocephalic and atraumatic.     Right Ear: Tympanic membrane, ear canal and external ear normal.     Left Ear: Tympanic membrane, ear canal and external ear normal.     Nose: Nose normal.     Mouth/Throat:     Mouth: Mucous membranes are moist.     Pharynx: Oropharynx is clear.  Eyes:     Extraocular Movements: Extraocular movements intact.     Conjunctiva/sclera: Conjunctivae normal.     Pupils: Pupils are equal, round, and reactive to light.      Comments: Discs sharp  Neck:     Thyroid: No thyroid mass or thyromegaly.  Cardiovascular:     Rate and Rhythm: Normal rate and regular rhythm.     Heart sounds: S1 normal and S2 normal. No murmur heard.    No friction rub. No S3 or S4 sounds.     Comments: No carotid bruits.  Carotid, radial, femoral, DP and PT pulses normal and equal.   Pulmonary:     Effort: Pulmonary effort is normal.     Breath sounds: Normal breath sounds and air entry.  Chest:  Breasts:    Right: No inverted nipple, mass or nipple discharge.     Left: No inverted nipple, mass or nipple discharge.     Comments: Anterior left chest wall tenderness and muscular tenderness medial to left scapula Abdominal:     General: Bowel sounds are normal.     Palpations: Abdomen is soft. There is no hepatomegaly, splenomegaly or mass.     Tenderness: There is no abdominal tenderness.     Hernia: No hernia is present.  Genitourinary:    Comments: Deferred as plans to undergo exam with [facility] in December. Musculoskeletal:        General: Normal range of motion.     Cervical back: Normal range of motion and neck supple.     Right lower leg: No edema.     Left lower leg: No edema.  Feet:     Right foot:     Skin integrity: Skin integrity normal.     Left foot:     Skin integrity: Skin integrity normal.  Lymphadenopathy:     Head:     Right side of head: No submental or submandibular adenopathy.  Left side of head: No submental or submandibular adenopathy.     Cervical: No cervical adenopathy.     Upper Body:     Right upper body: No supraclavicular or axillary adenopathy.     Left upper body: No supraclavicular or axillary adenopathy.     Lower Body: No right inguinal adenopathy. No left inguinal adenopathy.  Skin:    General: Skin is warm.     Capillary Refill: Capillary refill takes less than 2 seconds.     Findings: No lesion or rash.  Neurological:     General: No focal deficit present.     Mental  Status: She is alert and oriented to person, place, and time.     Cranial Nerves: Cranial nerves 2-12 are intact.     Sensory: Sensation is intact.     Motor: Motor function is intact.     Coordination: Coordination is intact.     Gait: Gait is intact.     Deep Tendon Reflexes: Reflexes are normal and symmetric.  Psychiatric:        Mood and Affect: Mood normal.        Speech: Speech normal.        Behavior: Behavior normal. Behavior is cooperative.      Assessment & Plan   CPE Scheduled for mammogram through [facility] or [facility] gynecology, to whom she was referred for dysmenorrhea Influenza vaccine Schedule for COVID vaccine when in for this season. Will discuss Hep B vaccination at follow up as well as recommendations for Meningo B, which can obtain at [facility].    2.  Chest pain:  ECG normal.  Appears to be chest wall.  Stretches  3.  Low Vitamin D:  continue replacement with Vitamin D3.  Recheck at folllow up  4.  Obesity:  encouraged weekly goals to improve diet and increase daily physical activity.  Dietary outline given.  Discussed how to read nutrition labels.  5.  TMJ recurrent dislocation:  As per oral surgery, [facility].  6.  Hemoglobin [place] Disease:  as per [facility]ematology.  See vaccine recommendations for Meningo B above.

## 2022-09-15 NOTE — Patient Instructions (Addendum)
Drink a glass of water before every meal Drink 6-8 glasses of water daily Eat three meals daily Eat a protein and healthy fat with every meal (eggs,fish, chicken, turkey and limit red meats) Eat 5 servings of vegetables daily, mix the colors Eat 2 servings of fruit daily with skin, if skin is edible Use smaller plates Put food/utensils down as you chew and swallow each bite Eat at a table with friends/family at least once daily, no TV Do not eat in front of the TV  Recent studies show that people who consume all of their calories in a 12 hour period lose weight more efficiently.  For example, if you eat your first meal at 7:00 a.m., your last meal of the day should be completed by 7:00 p.m. 

## 2022-11-11 ENCOUNTER — Other Ambulatory Visit: Payer: Self-pay

## 2022-11-11 DIAGNOSIS — Z1211 Encounter for screening for malignant neoplasm of colon: Secondary | ICD-10-CM

## 2022-11-11 LAB — POC FIT TEST STOOL: Fecal Occult Blood: NEGATIVE

## 2022-12-07 ENCOUNTER — Other Ambulatory Visit: Payer: Self-pay

## 2022-12-07 DIAGNOSIS — D5720 Sickle-cell/Hb-C disease without crisis: Secondary | ICD-10-CM

## 2022-12-08 LAB — CBC WITH DIFFERENTIAL/PLATELET
Basophils Absolute: 0.1 10*3/uL (ref 0.0–0.2)
Basos: 1 %
EOS (ABSOLUTE): 0.2 10*3/uL (ref 0.0–0.4)
Eos: 3 %
Hematocrit: 31.6 % — ABNORMAL LOW (ref 34.0–46.6)
Hemoglobin: 9.9 g/dL — ABNORMAL LOW (ref 11.1–15.9)
Immature Grans (Abs): 0 10*3/uL (ref 0.0–0.1)
Immature Granulocytes: 0 %
Lymphocytes Absolute: 1.9 10*3/uL (ref 0.7–3.1)
Lymphs: 29 %
MCH: 26.8 pg (ref 26.6–33.0)
MCHC: 31.3 g/dL — ABNORMAL LOW (ref 31.5–35.7)
MCV: 85 fL (ref 79–97)
Monocytes Absolute: 0.4 10*3/uL (ref 0.1–0.9)
Monocytes: 6 %
Neutrophils Absolute: 4 10*3/uL (ref 1.4–7.0)
Neutrophils: 61 %
Platelets: 242 10*3/uL (ref 150–450)
RBC: 3.7 x10E6/uL — ABNORMAL LOW (ref 3.77–5.28)
RDW: 18.8 % — ABNORMAL HIGH (ref 11.7–15.4)
WBC: 6.6 10*3/uL (ref 3.4–10.8)

## 2022-12-09 LAB — RETICULOCYTES: Retic Ct Pct: 2.8 % — ABNORMAL HIGH (ref 0.6–2.6)

## 2023-01-12 ENCOUNTER — Other Ambulatory Visit: Payer: Self-pay

## 2023-01-12 DIAGNOSIS — Z1231 Encounter for screening mammogram for malignant neoplasm of breast: Secondary | ICD-10-CM

## 2023-01-25 ENCOUNTER — Encounter: Payer: Self-pay | Admitting: Internal Medicine

## 2023-01-26 ENCOUNTER — Other Ambulatory Visit: Payer: Self-pay

## 2023-01-26 DIAGNOSIS — D5720 Sickle-cell/Hb-C disease without crisis: Secondary | ICD-10-CM

## 2023-01-27 ENCOUNTER — Encounter: Payer: Self-pay | Admitting: Internal Medicine

## 2023-01-27 LAB — CBC WITH DIFFERENTIAL/PLATELET
Basophils Absolute: 0.1 10*3/uL (ref 0.0–0.2)
Basos: 1 %
EOS (ABSOLUTE): 0.2 10*3/uL (ref 0.0–0.4)
Eos: 3 %
Hematocrit: 30.6 % — ABNORMAL LOW (ref 34.0–46.6)
Hemoglobin: 9.7 g/dL — ABNORMAL LOW (ref 11.1–15.9)
Immature Grans (Abs): 0 10*3/uL (ref 0.0–0.1)
Immature Granulocytes: 0 %
Lymphocytes Absolute: 2.2 10*3/uL (ref 0.7–3.1)
Lymphs: 35 %
MCH: 27.2 pg (ref 26.6–33.0)
MCHC: 31.7 g/dL (ref 31.5–35.7)
MCV: 86 fL (ref 79–97)
Monocytes Absolute: 0.4 10*3/uL (ref 0.1–0.9)
Monocytes: 6 %
Neutrophils Absolute: 3.4 10*3/uL (ref 1.4–7.0)
Neutrophils: 55 %
Platelets: 223 10*3/uL (ref 150–450)
RBC: 3.57 x10E6/uL — ABNORMAL LOW (ref 3.77–5.28)
RDW: 19.7 % — ABNORMAL HIGH (ref 11.7–15.4)
WBC: 6.3 10*3/uL (ref 3.4–10.8)

## 2023-01-27 LAB — RETICULOCYTES: Retic Ct Pct: 3.5 % — ABNORMAL HIGH (ref 0.6–2.6)

## 2023-02-21 ENCOUNTER — Ambulatory Visit
Admission: RE | Admit: 2023-02-21 | Discharge: 2023-02-21 | Disposition: A | Discharge status: Home / Self Care | Payer: No Typology Code available for payment source | Source: Ambulatory Visit | Attending: Internal Medicine | Admitting: Internal Medicine

## 2023-02-21 ENCOUNTER — Ambulatory Visit: Payer: Self-pay | Admitting: *Deleted

## 2023-02-21 VITALS — BP 104/68 | Wt 238.0 lb

## 2023-02-21 DIAGNOSIS — Z1239 Encounter for other screening for malignant neoplasm of breast: Secondary | ICD-10-CM

## 2023-02-21 DIAGNOSIS — Z1231 Encounter for screening mammogram for malignant neoplasm of breast: Secondary | ICD-10-CM

## 2023-02-21 NOTE — Patient Instructions (Signed)
Explained breast self awareness with Taylor Roman. Patient did not need a Pap smear today due to last Pap smear and HPV was 12/09/2021. Let her know that based on her history of abnormal Pap smears that her next Pap smear will be due in December 2026 at 1 years. Referred patient to the [facility] for a screening mammogram on mobile unit. Appointment scheduled Tuesday, February 21, 2023 at 1140. Patient aware of appointment and will be there. Let patient know the Breast Center will follow up with her within the next couple weeks with results of mammogram by letter or phone. Taylor Roman verbalized understanding.  Taylor Roman, [name], RN 11:16 AM

## 2023-02-21 NOTE — Progress Notes (Signed)
Taylor Roman is a 43 y.o. female who presents to [facility] clinic today with no complaints.    Pap Smear: Pap smear not completed today. Last Pap smear was 12/09/2021 at [facility] and was normal with negative HPV. Patients three prior Pap smears 12/01/2020 was normal with negative HPV, 09/25/2019 was normal, and 12/20/2016 was normal with negative HPV. Patient has history of two abnormal Pap smears 08/18/2015 that was HGSIL with positive HPV that colposcopy was completed 10/07/2015 that showed CIN-II and CKC 12/07/2015 that was benign for follow-up. Patient has a history of one other abnormal Pap smear 07/04/2014 that was LSIL that a colposcopy was completed 10/17/2014 that no biopsies were completed due to patient was pregnant. The above Pap smear results are available in Epic.    Physical exam: Breasts Breasts symmetrical. No skin abnormalities bilateral breasts. No nipple retraction bilateral breasts. No nipple discharge bilateral breasts. No lymphadenopathy. No lumps palpated bilateral breasts. No complaints of pain or tenderness on exam.  MS DIGITAL SCREENING TOMO BILATERAL Result Date: 12/10/2021 CLINICAL DATA:  Screening. EXAM: DIGITAL SCREENING BILATERAL MAMMOGRAM WITH TOMOSYNTHESIS AND CAD TECHNIQUE: Bilateral screening digital craniocaudal and mediolateral oblique mammograms were obtained. Bilateral screening digital breast tomosynthesis was performed. The images were evaluated with computer-aided detection. COMPARISON:  Previous exam(s). ACR Breast Density Category b: There are scattered areas of fibroglandular density. FINDINGS: There are no findings suspicious for malignancy. IMPRESSION: No mammographic evidence of malignancy. A result letter of this screening mammogram will be mailed directly to the patient. RECOMMENDATION: Screening mammogram in one year. (Code:SM-B-01Y) BI-RADS CATEGORY  1: Negative. Electronically Signed   By: Ted Mcalpine M.D.   On: 12/10/2021 14:44   MM 3D SCREEN  BREAST BILATERAL Result Date: 12/02/2020 CLINICAL DATA:  Screening. EXAM: DIGITAL SCREENING BILATERAL MAMMOGRAM WITH TOMOSYNTHESIS AND CAD TECHNIQUE: Bilateral screening digital craniocaudal and mediolateral oblique mammograms were obtained. Bilateral screening digital breast tomosynthesis was performed. The images were evaluated with computer-aided detection. COMPARISON:  Previous exam(s). ACR Breast Density Category b: There are scattered areas of fibroglandular density. FINDINGS: There are no findings suspicious for malignancy. IMPRESSION: No mammographic evidence of malignancy. A result letter of this screening mammogram will be mailed directly to the patient. RECOMMENDATION: Screening mammogram in one year. (Code:SM-B-01Y) BI-RADS CATEGORY  1: Negative. Electronically Signed   By: Sherian Rein M.D.   On: 12/02/2020 11:02       Pelvic/Bimanual Pap is not indicated today per BCCCP guidelines.   Smoking History: Patient has never smoked.   Patient Navigation: Patient education provided. Access to services provided for patient through BCCCP program.   Colorectal Cancer Screening: Per patient has never had colonoscopy completed. Patient completed a FIT test given by her PCP 11/11/2022 that was negative. No complaints today.    Breast and Cervical Cancer Risk Assessment: Patient does not have family history of breast cancer, known genetic mutations, or radiation treatment to the chest before age 75. Patient has history of cervical dysplasia. Patient has no history of being immunocompromised or DES exposure in-utero.   Risk Scores as of Encounter on 02/21/2023     Taylor Roman           5-year 0.82%   Lifetime 9.35%            Last calculated by Taylor Roman, CMA on 02/21/2023 at 11:12 AM        A: BCCCP exam without pap smear No complaints.  P: Referred patient to the [facility] for a  screening mammogram on mobile unit. Appointment scheduled Tuesday, February 21, 2023 at 1140.   Taylor Heidelberg, RN 02/21/2023 11:16 AM

## 2023-02-22 ENCOUNTER — Other Ambulatory Visit: Payer: Self-pay

## 2023-02-22 ENCOUNTER — Telehealth: Payer: Self-pay

## 2023-02-22 MED ORDER — FAMOTIDINE 40 MG PO TABS: 40.0000 mg | ORAL_TABLET | Freq: Every day | ORAL | 0 refills | Status: DC

## 2023-02-22 NOTE — Telephone Encounter (Signed)
Patient has been experiencing stomach pain for about a week. Upper stomach pain under her breast. Pain feels like outward pressure. Has not noticed specific food as the cause. Hurts to eat so has not been eating much lately. Taking tums which has not helped.

## 2023-02-22 NOTE — Telephone Encounter (Signed)
Rx for Famotidine 40mg  daily at bedtime. Patient has been notified of Rx and instructed to call if symptoms do not resolve. Also instructed to visit an urgent care is Sx get worse and if not able to come in for a visit.

## 2023-03-15 ENCOUNTER — Ambulatory Visit: Payer: Self-pay | Admitting: Internal Medicine

## 2023-03-15 ENCOUNTER — Encounter: Payer: Self-pay | Admitting: Internal Medicine

## 2023-03-15 VITALS — BP 100/80 | HR 68 | Resp 18 | Ht 68.0 in | Wt 238.0 lb

## 2023-03-15 DIAGNOSIS — K219 Gastro-esophageal reflux disease without esophagitis: Secondary | ICD-10-CM

## 2023-03-15 DIAGNOSIS — Z789 Other specified health status: Secondary | ICD-10-CM

## 2023-03-15 DIAGNOSIS — Z0184 Encounter for antibody response examination: Secondary | ICD-10-CM

## 2023-03-15 DIAGNOSIS — E559 Vitamin D deficiency, unspecified: Secondary | ICD-10-CM

## 2023-03-15 NOTE — Progress Notes (Signed)
    Subjective:    Patient ID: Taylor Roman, female   DOB: 01-Dec-1980, 43 y.o.   MRN: [id number]   HPI   Nonimmune to Hepatitis B from [facility] serology.  Has not been vaccinated.   Not clear if vaccinated or immune to Hepatitis A as well.    2.  Obesity:  Up 3.5 lbs from September.  Has not really made an goals.  She grazes throughout the day:  often eating cookies or crackers with juice. She asks about eating Chia seeds and discussed not a magic bullet if not eating well or being physically active. She does walk most days about 30 minutes.    3.  Heartburn:  she is taking Famotidine 40 mg, but only twice weekly.  If she takes daily, her symptoms resolve.  Does not have HOB elevated.  Discussed foods that set GERD off.    Current Meds  Medication Sig   acetaminophen (TYLENOL) 500 MG tablet Take 500 mg by mouth every 6 (six) hours as needed for mild pain.   Calcium Citrate 250 MG TABS 2 tabs twice daily   Cholecalciferol (VITAMIN D3) 25 MCG (1000 UT) CAPS Take 1 capsule (1,000 Units total) by mouth daily.   famotidine (PEPCID) 40 MG tablet Take 1 tablet (40 mg total) by mouth at bedtime.   folic acid (FOLVITE) 800 MCG tablet Take 400 mcg by mouth daily.   hydroxyurea (HYDREA) 500 MG capsule 2 caps by mouth once daily with meal.   ibuprofen (ADVIL) 800 MG tablet Take 1 tablet (800 mg total) by mouth every 6 (six) hours as needed for moderate pain.   oxyCODONE (OXY IR/ROXICODONE) 5 MG immediate release tablet Take 5 mg by mouth every 4 (four) hours as needed.   vitamin B-12 (CYANOCOBALAMIN) 100 MCG tablet Take 500 mcg by mouth daily. Unsure of dosage   No Known Allergies   Review of Systems    Objective:   BP 100/80 (BP Location: Right Arm, Patient Position: Sitting, Cuff Size: Normal)   Pulse 68   Resp 18   Ht 5\' 8"  (1.727 m)   Wt 238 lb (108 kg)   LMP 03/04/2023 (Exact Date)   SpO2 98%   BMI 36.19 kg/m   Physical Exam NAD Lungs:  CTA CV:  RRR without murmur or rub.   Radial and DP pulses normal and equal.   Abd:  S, NT, No HSM or mass, + BS LE:  No edema   Assessment & Plan   Non-immune to Hep B.  She did not want to start vaccination today as just started Ramadan and fasting.  Would like to return after Ramadan completed in 1 month.   No serology to determine if immune to Hep A as well.  Check Hep A Ig total today.  2.  Vitamin D deficiency:  not consistent with Vitamin D supplementation--to take daily.  Encouraged gradually increasing sun exposure with outdoor physical activity daily.   Check Vitamin D level.  3.  GERD:  encouraged her to take Famotidine daily if symptoms more than once weekly.  Discussed GERD precautions.

## 2023-03-15 NOTE — Patient Instructions (Addendum)
 Addendum for vaccines in patient with Hgb [place]:    She will need vaccination with Meningo B--primary series, 1 year booster after primary series and every 2-3 years thereafter. Menquadfi--needs every 5 years after 2nd in primary series Pneumococcal not due again until 43 yo. No  further HIB needed.

## 2023-03-16 LAB — HEPATITIS A ANTIBODY, TOTAL: hep A Total Ab: POSITIVE — AB

## 2023-03-16 LAB — VITAMIN D 25 HYDROXY (VIT D DEFICIENCY, FRACTURES): Vit D, 25-Hydroxy: 15.3 ng/mL — ABNORMAL LOW (ref 30.0–100.0)

## 2023-03-20 DIAGNOSIS — Z0184 Encounter for antibody response examination: Secondary | ICD-10-CM | POA: Insufficient documentation

## 2023-03-20 DIAGNOSIS — K219 Gastro-esophageal reflux disease without esophagitis: Secondary | ICD-10-CM | POA: Insufficient documentation

## 2023-04-05 ENCOUNTER — Ambulatory Visit (INDEPENDENT_AMBULATORY_CARE_PROVIDER_SITE_OTHER): Payer: Self-pay | Admitting: Internal Medicine

## 2023-04-05 DIAGNOSIS — Z23 Encounter for immunization: Secondary | ICD-10-CM

## 2023-05-17 ENCOUNTER — Ambulatory Visit: Payer: Self-pay

## 2023-05-17 DIAGNOSIS — Z23 Encounter for immunization: Secondary | ICD-10-CM

## 2023-06-06 ENCOUNTER — Ambulatory Visit: Payer: Self-pay | Admitting: Internal Medicine

## 2023-06-13 ENCOUNTER — Encounter (HOSPITAL_COMMUNITY): Payer: Self-pay

## 2023-06-13 ENCOUNTER — Emergency Department (HOSPITAL_COMMUNITY)
Admission: EM | Admit: 2023-06-13 | Discharge: 2023-06-13 | Disposition: A | Discharge status: Home / Self Care | Attending: Emergency Medicine | Admitting: Emergency Medicine

## 2023-06-13 ENCOUNTER — Other Ambulatory Visit: Payer: Self-pay

## 2023-06-13 DIAGNOSIS — S0302XA Dislocation of jaw, left side, initial encounter: Secondary | ICD-10-CM | POA: Insufficient documentation

## 2023-06-13 DIAGNOSIS — X509XXA Other and unspecified overexertion or strenuous movements or postures, initial encounter: Secondary | ICD-10-CM | POA: Insufficient documentation

## 2023-06-13 MED ORDER — MIDAZOLAM HCL 2 MG/2ML IJ SOLN
1.0000 mg | Freq: Once | INTRAMUSCULAR | Status: AC
  Administered 2023-06-13: 1 mg via INTRAVENOUS
  Filled 2023-06-13: qty 2

## 2023-06-13 MED ORDER — KETOROLAC TROMETHAMINE 15 MG/ML IJ SOLN
15.0000 mg | Freq: Once | INTRAMUSCULAR | Status: AC
  Administered 2023-06-13: 15 mg via INTRAVENOUS
  Filled 2023-06-13: qty 1

## 2023-06-13 MED ORDER — PROPOFOL 10 MG/ML IV BOLUS
50.0000 mg | Freq: Once | INTRAVENOUS | Status: DC
  Filled 2023-06-13: qty 20

## 2023-06-13 MED ORDER — PROPOFOL 10 MG/ML IV BOLUS
INTRAVENOUS | Status: AC | PRN
  Administered 2023-06-13: 20 mg via INTRAVENOUS
  Administered 2023-06-13: 50 mg via INTRAVENOUS

## 2023-06-13 MED ORDER — LACTATED RINGERS IV BOLUS
500.0000 mL | Freq: Once | INTRAVENOUS | Status: AC
  Administered 2023-06-13: 500 mL via INTRAVENOUS

## 2023-06-13 MED ORDER — MORPHINE SULFATE (PF) 4 MG/ML IV SOLN
4.0000 mg | Freq: Once | INTRAVENOUS | Status: AC
  Administered 2023-06-13: 4 mg via INTRAVENOUS
  Filled 2023-06-13: qty 1

## 2023-06-13 MED ORDER — ONDANSETRON HCL 4 MG/2ML IJ SOLN
4.0000 mg | Freq: Once | INTRAMUSCULAR | Status: AC
  Administered 2023-06-13: 4 mg via INTRAVENOUS
  Filled 2023-06-13: qty 2

## 2023-06-13 NOTE — ED Triage Notes (Signed)
 Pt to ED c/o jaw pain, reports dislocated jaw when yawning. Hx of the same.

## 2023-06-13 NOTE — ED Provider Notes (Signed)
 [place] EMERGENCY DEPARTMENT AT [facility] Provider Note   CSN: [id number] Arrival date & time: 06/13/23  1818     History  Chief Complaint  Patient presents with   Jaw Pain    Taylor Roman is a 43 y.o. female.  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  She reports she was yawning and felt her jaw dislocate on the left.  It has not gone back in.  The history is provided by the patient.       Home Medications Prior to Admission medications   Medication Sig Start Date End Date Taking? Authorizing Provider  hydroxyurea  (HYDREA ) 500 MG capsule 2 caps by mouth once daily with meal. Patient taking differently: Take 500 mg by mouth daily. 2 caps by mouth once daily with meal. 11/12/21  Yes Ronalee Cocking, MD  loratadine  (CLARITIN ) 10 MG tablet Take 1 tablet (10 mg total) by mouth daily. Patient not taking: Reported on 06/06/2019 04/19/19 06/06/19  Ronalee Cocking, MD      Allergies    Patient has no known allergies.    Review of Systems   Review of Systems  Physical Exam Updated Vital Signs BP 118/79   Pulse (!) 57   Temp 98.2 F (36.8 C)   Resp 18   LMP 05/26/2023   SpO2 100%  Physical Exam Vitals and nursing note reviewed.  HENT:     Head: Normocephalic.     Mouth/Throat:     Comments: Jaws shifted slightly to the right unable to open her mouth.  Pain over the left TMJ Cardiovascular:     Rate and Rhythm: Normal rate.     Pulses: Normal pulses.  Pulmonary:     Effort: Pulmonary effort is normal.  Musculoskeletal:     Cervical back: Normal range of motion and neck supple.  Neurological:     Mental Status: She is alert. Mental status is at baseline.  Psychiatric:        Mood and Affect: Mood normal.     ED Results / Procedures / Treatments   Labs (all labs ordered are listed, but only abnormal results are  displayed) Labs Reviewed - No data to display  EKG None  Radiology No results found.  Procedures .Sedation  Date/Time: 06/13/2023 9:22 PM  Performed by: Almond Army, MD Authorized by: Almond Army, MD   Consent:    Consent obtained:  Verbal   Consent given by:  Patient   Risks discussed:  Allergic reaction, dysrhythmia, inadequate sedation, nausea, prolonged hypoxia resulting in organ damage, prolonged sedation necessitating reversal, respiratory compromise necessitating ventilatory assistance and intubation and vomiting   Alternatives discussed:  Analgesia without sedation, anxiolysis and regional anesthesia Universal protocol:    Procedure explained and questions answered to patient or proxy's satisfaction: yes     Relevant documents present and verified: yes     Test results available: yes     Imaging studies available: yes     Required blood products, implants, devices, and special equipment available: yes     Site/side marked: yes     Immediately prior to procedure, a time out was called: yes     Patient identity confirmed:  Verbally with patient Indications:    Procedure necessitating sedation performed by:  Physician performing sedation Pre-sedation assessment:    Time since last food or drink:  5 hours ago  ASA classification: class 1 - normal, healthy patient     Mouth opening:  1 finger width   Thyromental distance:  4 finger widths   Mallampati score:  III - soft palate, base of uvula visible   Neck mobility: normal     Pre-sedation assessments completed and reviewed: airway patency, cardiovascular function, hydration status, mental status, nausea/vomiting, pain level, respiratory function and temperature   A pre-sedation assessment was completed prior to the start of the procedure Immediate pre-procedure details:    Reassessment: Patient reassessed immediately prior to procedure     Reviewed: vital signs, relevant labs/tests and NPO status      Verified: bag valve mask available, emergency equipment available, intubation equipment available, IV patency confirmed, oxygen available and suction available   Procedure details (see MAR for exact dosages):    Preoxygenation:  Nasal cannula   Sedation:  Propofol    Intended level of sedation: deep   Analgesia:  Morphine    Intra-procedure monitoring:  Blood pressure monitoring, cardiac monitor, continuous pulse oximetry, frequent LOC assessments, frequent vital sign checks and continuous capnometry   Intra-procedure events: none     Total Provider sedation time (minutes):  10 Post-procedure details:   A post-sedation assessment was completed following the completion of the procedure.   Attendance: Constant attendance by certified staff until patient recovered     Recovery: Patient returned to pre-procedure baseline     Post-sedation assessments completed and reviewed: airway patency, cardiovascular function, hydration status, mental status, nausea/vomiting, pain level, respiratory function and temperature     Patient is stable for discharge or admission: yes     Procedure completion:  Tolerated well, no immediate complications .Reduction of dislocation  Date/Time: 06/13/2023 9:23 PM  Performed by: Almond Army, MD Authorized by: Almond Army, MD  Consent: Written consent obtained. Risks and benefits: risks, benefits and alternatives were discussed Consent given by: patient Patient understanding: patient states understanding of the procedure being performed Patient consent: the patient's understanding of the procedure matches consent given Site marked: the operative site was marked Patient identity confirmed: verbally with patient Local anesthesia used: no  Anesthesia: Local anesthesia used: no  Sedation: Patient sedated: yes Sedation type: moderate (conscious) sedation Sedatives: morphine  and propofol  Sedation start date/time: 06/13/2023 8:50 PM Sedation end date/time:  06/13/2023 9:00 PM Vitals: Vital signs were monitored during sedation.  Patient tolerance: patient tolerated the procedure well with no immediate complications Comments: With coracoid pressure on the left side of the jaw and cheek and mandible pressure on the right large click was heard and appears more anatomically aligned       Medications Ordered in ED Medications  propofol  (DIPRIVAN ) 10 mg/mL bolus/IV push 50 mg (200 mg Intravenous Not Given 06/13/23 2051)  propofol  (DIPRIVAN ) 10 mg/mL bolus/IV push (20 mg Intravenous Given 06/13/23 2049)  morphine  (PF) 4 MG/ML injection 4 mg (4 mg Intravenous Given 06/13/23 1942)  midazolam  (VERSED ) injection 1 mg (1 mg Intravenous Given 06/13/23 1942)  ondansetron  (ZOFRAN ) injection 4 mg (4 mg Intravenous Given 06/13/23 1942)  lactated ringers  bolus 500 mL (0 mLs Intravenous Stopped 06/13/23 2139)  ketorolac  (TORADOL ) 15 MG/ML injection 15 mg (15 mg Intravenous Given 06/13/23 2139)    ED Course/ Medical Decision Making/ A&P                                 Medical Decision Making Risk Prescription drug management.   Patient with concern for  recurrent jaw dislocation.  Attempted bedside reduction with some morphine  and Versed  however patient is not able to tolerate.  Will need sedation. After sedation a large click was heard and feel that jaws reduced.  Upon waking up patient reports it still sore but feels like it is back in.  Will give some toradol  and re-evaluate to ensure pt tolerating po's.  Will also give referral to Dr. Ralston Burkes as this has happended multiple times to pt. Patient's pain has improved now.  Discussed avoiding yawning, opening her mouth wide and follow-up.  She is stable for discharge at this time.        Final Clinical Impression(s) / ED Diagnoses Final diagnoses:  Closed dislocation of left jaw, initial encounter    Rx / DC Orders ED Discharge Orders     None         Almond Army, MD 06/13/23 2155

## 2023-06-13 NOTE — ED Provider Triage Note (Signed)
 Emergency Medicine Provider Triage Evaluation Note  Taylor Roman , a 43 y.o. female  was evaluated in triage.  Pt complains of jaw dislocation, hx of same. Has never been able to reduce at home. Occurred around 5pm today.  Review of Systems  Positive:  Negative:   Physical Exam  BP (!) 139/92 (BP Location: Right Arm)   Pulse 79   Temp 98.6 F (37 C) (Oral)   Resp 16   SpO2 100%  Gen:   Awake, no distress   Resp:  Normal effort  MSK:   Moves extremities without difficulty  Other:    Medical Decision Making  Medically screening exam initiated at 6:32 PM.  Appropriate orders placed.  Amandy Harden was informed that the remainder of the evaluation will be completed by another provider, this initial triage assessment does not replace that evaluation, and the importance of remaining in the ED until their evaluation is complete.     Darlis Eisenmenger, PA-C 06/13/23 [phone]

## 2023-06-13 NOTE — Discharge Instructions (Signed)
 Avoid yawning or opening your mouth wide.  Over the next few days you also may want to stick with a soft diet

## 2023-09-15 ENCOUNTER — Other Ambulatory Visit: Payer: Self-pay

## 2023-09-15 DIAGNOSIS — Z0000 Encounter for general adult medical examination without abnormal findings: Secondary | ICD-10-CM

## 2023-09-18 ENCOUNTER — Encounter: Payer: Self-pay | Admitting: Internal Medicine

## 2023-09-18 ENCOUNTER — Ambulatory Visit: Payer: Self-pay | Admitting: Internal Medicine

## 2023-09-18 LAB — COMPREHENSIVE METABOLIC PANEL WITH GFR
ALT: 12 IU/L (ref 0–32)
AST: 20 IU/L (ref 0–40)
Albumin: 4.3 g/dL (ref 3.9–4.9)
Alkaline Phosphatase: 60 IU/L (ref 44–121)
BUN/Creatinine Ratio: 12 (ref 9–23)
BUN: 8 mg/dL (ref 6–24)
Bilirubin Total: 0.8 mg/dL (ref 0.0–1.2)
CO2: 21 mmol/L (ref 20–29)
Calcium: 8.6 mg/dL — ABNORMAL LOW (ref 8.7–10.2)
Chloride: 104 mmol/L (ref 96–106)
Creatinine, Ser: 0.68 mg/dL (ref 0.57–1.00)
Globulin, Total: 3 g/dL (ref 1.5–4.5)
Glucose: 107 mg/dL — ABNORMAL HIGH (ref 70–99)
Potassium: 4.2 mmol/L (ref 3.5–5.2)
Sodium: 139 mmol/L (ref 134–144)
Total Protein: 7.3 g/dL (ref 6.0–8.5)
eGFR: 110 mL/min/1.73 (ref >59)

## 2023-09-18 LAB — CBC WITH DIFFERENTIAL/PLATELET
Basophils Absolute: 0.1 x10E3/uL (ref 0.0–0.2)
Basos: 1 %
EOS (ABSOLUTE): 0.2 x10E3/uL (ref 0.0–0.4)
Eos: 3 %
Hematocrit: 29.7 % — ABNORMAL LOW (ref 34.0–46.6)
Hemoglobin: 9.1 g/dL — ABNORMAL LOW (ref 11.1–15.9)
Immature Grans (Abs): 0 x10E3/uL (ref 0.0–0.1)
Immature Granulocytes: 0 %
Lymphocytes Absolute: 1.9 x10E3/uL (ref 0.7–3.1)
Lymphs: 31 %
MCH: 26.1 pg — ABNORMAL LOW (ref 26.6–33.0)
MCHC: 30.6 g/dL — ABNORMAL LOW (ref 31.5–35.7)
MCV: 85 fL (ref 79–97)
Monocytes Absolute: 0.3 x10E3/uL (ref 0.1–0.9)
Monocytes: 5 %
Neutrophils Absolute: 3.8 x10E3/uL (ref 1.4–7.0)
Neutrophils: 60 %
Platelets: 223 x10E3/uL (ref 150–450)
RBC: 3.48 x10E6/uL — ABNORMAL LOW (ref 3.77–5.28)
RDW: 20 % — ABNORMAL HIGH (ref 11.7–15.4)
WBC: 6.3 x10E3/uL (ref 3.4–10.8)

## 2023-09-18 LAB — HEMOGLOBIN A1C
Est. average glucose Bld gHb Est-mCnc: 82 mg/dL
Hgb A1c MFr Bld: 4.5 % — ABNORMAL LOW (ref 4.8–5.6)

## 2023-09-19 ENCOUNTER — Encounter: Payer: Self-pay | Admitting: Internal Medicine

## 2023-10-05 ENCOUNTER — Ambulatory Visit: Payer: Self-pay

## 2023-10-05 DIAGNOSIS — Z23 Encounter for immunization: Secondary | ICD-10-CM

## 2023-11-06 NOTE — Progress Notes (Signed)
 I talked to her and gave her your  massage.

## 2024-04-03 ENCOUNTER — Encounter: Payer: Self-pay | Admitting: Internal Medicine
